# Patient Record
Sex: Male | Born: 1960 | Race: White | Hispanic: No | Marital: Married | Smoking: Never smoker
Health system: Southern US, Community
[De-identification: ages and names within clinical notes are randomized; demographics above are authoritative.]

## PROBLEM LIST (undated history)

## (undated) DIAGNOSIS — J45909 Unspecified asthma, uncomplicated: Secondary | ICD-10-CM

## (undated) DIAGNOSIS — M1990 Unspecified osteoarthritis, unspecified site: Secondary | ICD-10-CM

## (undated) DIAGNOSIS — B009 Herpesviral infection, unspecified: Secondary | ICD-10-CM

## (undated) DIAGNOSIS — F329 Major depressive disorder, single episode, unspecified: Secondary | ICD-10-CM

## (undated) DIAGNOSIS — F32A Depression, unspecified: Secondary | ICD-10-CM

## (undated) DIAGNOSIS — F419 Anxiety disorder, unspecified: Secondary | ICD-10-CM

## (undated) DIAGNOSIS — E785 Hyperlipidemia, unspecified: Secondary | ICD-10-CM

## (undated) DIAGNOSIS — K219 Gastro-esophageal reflux disease without esophagitis: Secondary | ICD-10-CM

## (undated) DIAGNOSIS — T7840XA Allergy, unspecified, initial encounter: Secondary | ICD-10-CM

## (undated) DIAGNOSIS — I10 Essential (primary) hypertension: Secondary | ICD-10-CM

## (undated) HISTORY — DX: Anxiety disorder, unspecified: F41.9

## (undated) HISTORY — DX: Allergy, unspecified, initial encounter: T78.40XA

## (undated) HISTORY — PX: JOINT REPLACEMENT: SHX530

## (undated) HISTORY — DX: Essential (primary) hypertension: I10

## (undated) HISTORY — DX: Gastro-esophageal reflux disease without esophagitis: K21.9

## (undated) HISTORY — DX: Depression, unspecified: F32.A

## (undated) HISTORY — PX: COLONOSCOPY WITH PROPOFOL: SHX5780

## (undated) HISTORY — DX: Unspecified asthma, uncomplicated: J45.909

## (undated) HISTORY — DX: Unspecified osteoarthritis, unspecified site: M19.90

## (undated) HISTORY — DX: Major depressive disorder, single episode, unspecified: F32.9

## (undated) HISTORY — PX: HERNIA REPAIR: SHX51

## (undated) HISTORY — DX: Hyperlipidemia, unspecified: E78.5

## (undated) HISTORY — DX: Herpesviral infection, unspecified: B00.9

---

## 2009-05-05 ENCOUNTER — Ambulatory Visit: Payer: Self-pay | Admitting: Gastroenterology

## 2010-03-24 ENCOUNTER — Ambulatory Visit: Payer: Self-pay | Admitting: Family Medicine

## 2010-04-21 ENCOUNTER — Ambulatory Visit: Payer: Self-pay | Admitting: Gastroenterology

## 2012-09-24 ENCOUNTER — Ambulatory Visit: Payer: Self-pay | Admitting: Family Medicine

## 2013-10-30 ENCOUNTER — Ambulatory Visit: Payer: Self-pay | Admitting: Physical Medicine and Rehabilitation

## 2014-06-23 DIAGNOSIS — I10 Essential (primary) hypertension: Secondary | ICD-10-CM | POA: Insufficient documentation

## 2014-06-23 DIAGNOSIS — E785 Hyperlipidemia, unspecified: Secondary | ICD-10-CM | POA: Insufficient documentation

## 2014-07-13 ENCOUNTER — Ambulatory Visit: Payer: Self-pay | Admitting: Family Medicine

## 2014-08-11 DIAGNOSIS — M62838 Other muscle spasm: Secondary | ICD-10-CM | POA: Insufficient documentation

## 2014-08-11 DIAGNOSIS — M5116 Intervertebral disc disorders with radiculopathy, lumbar region: Secondary | ICD-10-CM | POA: Insufficient documentation

## 2015-03-10 ENCOUNTER — Ambulatory Visit: Payer: Self-pay | Admitting: Gastroenterology

## 2015-03-22 ENCOUNTER — Ambulatory Visit
Admit: 2015-03-22 | Disposition: A | Discharge status: Home / Self Care | Payer: Self-pay | Attending: Physical Medicine and Rehabilitation | Admitting: Physical Medicine and Rehabilitation

## 2015-05-10 DIAGNOSIS — M5136 Other intervertebral disc degeneration, lumbar region: Secondary | ICD-10-CM | POA: Insufficient documentation

## 2015-06-04 ENCOUNTER — Ambulatory Visit (INDEPENDENT_AMBULATORY_CARE_PROVIDER_SITE_OTHER): Payer: Federal, State, Local not specified - PPO | Admitting: Family Medicine

## 2015-06-04 ENCOUNTER — Encounter: Payer: Self-pay | Admitting: Family Medicine

## 2015-06-04 VITALS — BP 118/72 | HR 75 | Temp 98.1°F | Resp 16 | Wt 178.8 lb

## 2015-06-04 DIAGNOSIS — R739 Hyperglycemia, unspecified: Secondary | ICD-10-CM | POA: Diagnosis not present

## 2015-06-04 DIAGNOSIS — E785 Hyperlipidemia, unspecified: Secondary | ICD-10-CM

## 2015-06-04 DIAGNOSIS — L259 Unspecified contact dermatitis, unspecified cause: Secondary | ICD-10-CM

## 2015-06-04 MED ORDER — FLUOCINONIDE 0.05 % EX CREA: 1.0000 "application " | TOPICAL_CREAM | Freq: Three times a day (TID) | CUTANEOUS | Status: DC

## 2015-06-04 NOTE — Patient Instructions (Addendum)
Use hydrocortisone for any eyelid rash. Continue oral antihistamines like Benadryl or Claritin for itching We will call you with the results of your labs

## 2015-06-04 NOTE — Progress Notes (Signed)
Subjective:     Patient ID: Terry Franco, male   DOB: 1961-08-09, 54 y.o.   MRN: [id number]  HPI  Chief Complaint  Patient presents with  . Poison Ivy    on middle finger on left hand, possibly around the eyes. started 8 days ago.  Has been taking oral Benadryl. As eyelids have started itching wishes to be checked prior to going on vacation tomorrow. Also wishes to update labs.   Review of Systems  Eyes: Positive for itching (eyelids only, eye itself not invovled without changes in vision).       Objective:   Physical Exam  Constitutional: He appears well-developed and well-nourished. No distress.  Eyes:  No upper eyelid rash noted  Skin: Rash (left third finger with fading rash/involuting vesicles) noted.       Assessment:     1. Contact dermatitis  - fluocinonide cream (LIDEX) 0.05 %; Apply 1 application topically 3 (three) times daily. To poison ivy rash  Dispense: 15 g; Refill: 0  2. Hyperglycemia  - Renal function panel  3. HLD (hyperlipidemia)  - Lipid panel    Plan:     Discussed use of otc hydrocortisone cream should he develop eyelid rash. Continue antihistamines. Not to use high potency cream on his face.

## 2015-07-07 ENCOUNTER — Encounter: Payer: Self-pay | Admitting: Family Medicine

## 2015-07-07 ENCOUNTER — Ambulatory Visit (INDEPENDENT_AMBULATORY_CARE_PROVIDER_SITE_OTHER): Payer: Federal, State, Local not specified - PPO | Admitting: Family Medicine

## 2015-07-07 VITALS — BP 110/62 | HR 73 | Temp 97.8°F | Resp 16 | Ht 69.0 in | Wt 180.0 lb

## 2015-07-07 DIAGNOSIS — J322 Chronic ethmoidal sinusitis: Secondary | ICD-10-CM | POA: Diagnosis not present

## 2015-07-07 MED ORDER — LEVOFLOXACIN 500 MG PO TABS: 500.0000 mg | ORAL_TABLET | Freq: Every day | ORAL | Status: DC

## 2015-07-07 NOTE — Patient Instructions (Signed)
Add Mucinex D. Continue steroid nasal spray.

## 2015-07-07 NOTE — Progress Notes (Signed)
Subjective:     Patient ID: Terry Franco, male   DOB: December 20, 1960, 54 y.o.   MRN: [id number]  HPI  Chief Complaint  Patient presents with  . Follow-up    Patient is here for follow up visit from urgent care on 06/28/15, patient has had complaints of sinus symptoms since 6/20. Patient was seen at Urgent care in [place] and wasx prescribed Levaquin and diagnosed with sinusitis, patient states that symptoms have improved but over the pst two days he has developed productive cough, sinus headache,sore throat and pain in both ears.   States he was resolving his sx on Levaquin but has persistent yellow-brown nasal drainage, sinus pressure and cough productive of the same. Continues on Nasacort for allergies.   Review of Systems  Constitutional: Negative for fever and chills.       Objective:   Physical Exam  Constitutional: He appears well-developed and well-nourished. No distress.  Ears: T.M's intact without inflammation; Right TM dull in appearance. Sinuses: right ethmoid sinus tenderness Throat: no tonsillar enlargement or exudate Neck: no cervical adenopathy Lungs: clear     Assessment:    1. Ethmoid sinusitis, unspecified chronicity - levofloxacin (LEVAQUIN) 500 MG tablet; Take 1 tablet (500 mg total) by mouth daily.  Dispense: 7 tablet; Refill: 0    Plan:    May add Mucinex D. Consider ENT consultation if not improved on added 7 day course of Levaquin.

## 2015-07-09 ENCOUNTER — Other Ambulatory Visit: Payer: Self-pay | Admitting: Family Medicine

## 2015-07-09 ENCOUNTER — Telehealth: Payer: Self-pay

## 2015-07-09 DIAGNOSIS — E785 Hyperlipidemia, unspecified: Secondary | ICD-10-CM

## 2015-07-09 LAB — LIPID PANEL
CHOLESTEROL TOTAL: 211 mg/dL — AB (ref 100–199)
Chol/HDL Ratio: 5 ratio units (ref 0.0–5.0)
HDL: 42 mg/dL (ref >39)
LDL CALC: 126 mg/dL — AB (ref 0–99)
TRIGLYCERIDES: 213 mg/dL — AB (ref 0–149)
VLDL Cholesterol Cal: 43 mg/dL — ABNORMAL HIGH (ref 5–40)

## 2015-07-09 LAB — RENAL FUNCTION PANEL
Albumin: 4.5 g/dL (ref 3.5–5.5)
BUN/Creatinine Ratio: 14 (ref 9–20)
BUN: 16 mg/dL (ref 6–24)
CO2: 24 mmol/L (ref 18–29)
CREATININE: 1.16 mg/dL (ref 0.76–1.27)
Calcium: 9.7 mg/dL (ref 8.7–10.2)
Chloride: 100 mmol/L (ref 97–108)
GFR calc Af Amer: 81 mL/min/{1.73_m2} (ref >59)
GFR calc non Af Amer: 70 mL/min/{1.73_m2} (ref >59)
Glucose: 104 mg/dL — ABNORMAL HIGH (ref 65–99)
PHOSPHORUS: 3.4 mg/dL (ref 2.5–4.5)
POTASSIUM: 3.9 mmol/L (ref 3.5–5.2)
Sodium: 142 mmol/L (ref 134–144)

## 2015-07-09 MED ORDER — PRAVASTATIN SODIUM 80 MG PO TABS: 80.0000 mg | ORAL_TABLET | Freq: Every day | ORAL | Status: DC

## 2015-07-09 NOTE — Telephone Encounter (Signed)
Patient has been advised he is in compliance with increasing his Pravastatin and wants Rx sent to [facility] [place].

## 2015-07-09 NOTE — Telephone Encounter (Signed)
-----   Message from [name], [place] sent at 07/09/2015  7:50 AM EDT ----- Sugar is mildly elevated as previously. Cholesterol remains mildly elevated despite current dose of pravastatin. Would recommend increased dose of pravastatin. Do you wish to do this? Also encourage regular exercise to maintain sugar control.

## 2015-10-19 ENCOUNTER — Encounter: Payer: Self-pay | Admitting: Family Medicine

## 2015-10-19 ENCOUNTER — Ambulatory Visit (INDEPENDENT_AMBULATORY_CARE_PROVIDER_SITE_OTHER): Payer: Federal, State, Local not specified - PPO | Admitting: Family Medicine

## 2015-10-19 VITALS — BP 116/76 | HR 106 | Temp 99.2°F | Resp 16 | Wt 180.8 lb

## 2015-10-19 DIAGNOSIS — B349 Viral infection, unspecified: Secondary | ICD-10-CM | POA: Diagnosis not present

## 2015-10-19 DIAGNOSIS — K219 Gastro-esophageal reflux disease without esophagitis: Secondary | ICD-10-CM

## 2015-10-19 DIAGNOSIS — R509 Fever, unspecified: Secondary | ICD-10-CM

## 2015-10-19 LAB — POCT INFLUENZA A/B
INFLUENZA B, POC: NEGATIVE
Influenza A, POC: NEGATIVE

## 2015-10-19 LAB — POCT RAPID STREP A (OFFICE): RAPID STREP A SCREEN: NEGATIVE

## 2015-10-19 MED ORDER — HYDROCODONE-HOMATROPINE 5-1.5 MG/5ML PO SYRP: ORAL_SOLUTION | ORAL | Status: DC

## 2015-10-19 NOTE — Progress Notes (Signed)
Subjective:     Patient ID: Terry Franco, male   DOB: 02/19/1961, 54 y.o.   MRN: [id number]  HPI  Chief Complaint  Patient presents with  . Fever    Patient comes in office today with concerns of flu like/cold symptoms since Saturday 10/29. Patient reports fever high 100.8, cough, sore throat, right ear pain, chills, body ache, sinus pain and pressure behind the eyes, diarrhea and nausea. Patient denies being around anyone who is sick or with similar symptoms in his household. Patient has taken otc Tylenol for relief.   Has not had the flu vaccine this year. Accompanied by his wife today.   Review of Systems     Objective:   Physical Exam  Constitutional: He appears well-developed and well-nourished. He has a sickly appearance.  Ears: T.M's intact without inflammation Throat: no tonsillar enlargement with mild erythema and ?  Exudate versus food particle on right tonsil Neck: no cervical adenopathy Lungs: clear     Assessment:    1. Fever, unspecified fever cause - POCT Influenza A/B - POCT rapid strep A - HYDROcodone-homatropine (HYCODAN) 5-1.5 MG/5ML syrup; 5 ml 4-6 hours as needed for cough  Dispense: 240 mL; Refill: 0  2. Viral syndrome   Plan:    Discussed use of Mucinex D for congestion, Delsym for cough, and Benadryl for postnasal drainage. May call if fever not resolving in the next 48 hours.

## 2015-10-19 NOTE — Patient Instructions (Signed)
Discussed use of Mucinex D for congestion, Delsym for cough, and Benadryl for postnasal drainage 

## 2015-10-21 ENCOUNTER — Ambulatory Visit
Admission: RE | Admit: 2015-10-21 | Discharge: 2015-10-21 | Disposition: A | Discharge status: Home / Self Care | Payer: Federal, State, Local not specified - PPO | Source: Ambulatory Visit | Attending: Family Medicine | Admitting: Family Medicine

## 2015-10-21 ENCOUNTER — Other Ambulatory Visit: Payer: Self-pay | Admitting: Family Medicine

## 2015-10-21 ENCOUNTER — Encounter: Payer: Self-pay | Admitting: Family Medicine

## 2015-10-21 ENCOUNTER — Ambulatory Visit (INDEPENDENT_AMBULATORY_CARE_PROVIDER_SITE_OTHER): Payer: Federal, State, Local not specified - PPO | Admitting: Family Medicine

## 2015-10-21 VITALS — BP 102/80 | HR 126 | Temp 100.1°F | Resp 18 | Wt 179.4 lb

## 2015-10-21 DIAGNOSIS — J849 Interstitial pulmonary disease, unspecified: Secondary | ICD-10-CM | POA: Diagnosis not present

## 2015-10-21 DIAGNOSIS — R509 Fever, unspecified: Secondary | ICD-10-CM | POA: Diagnosis present

## 2015-10-21 DIAGNOSIS — R300 Dysuria: Secondary | ICD-10-CM | POA: Diagnosis not present

## 2015-10-21 DIAGNOSIS — B349 Viral infection, unspecified: Secondary | ICD-10-CM

## 2015-10-21 DIAGNOSIS — J189 Pneumonia, unspecified organism: Secondary | ICD-10-CM

## 2015-10-21 DIAGNOSIS — R05 Cough: Secondary | ICD-10-CM | POA: Diagnosis present

## 2015-10-21 DIAGNOSIS — J181 Lobar pneumonia, unspecified organism: Principal | ICD-10-CM

## 2015-10-21 LAB — POCT URINALYSIS DIPSTICK
Glucose, UA: NEGATIVE
Leukocytes, UA: NEGATIVE
Nitrite, UA: NEGATIVE
PH UA: 6
PROTEIN UA: 300
UROBILINOGEN UA: 1

## 2015-10-21 MED ORDER — LEVOFLOXACIN 500 MG PO TABS: 500.0000 mg | ORAL_TABLET | Freq: Every day | ORAL | Status: DC

## 2015-10-21 NOTE — Patient Instructions (Signed)
We will call you with the x-ray report and decide on a course of action.

## 2015-10-21 NOTE — Progress Notes (Signed)
Subjective:     Patient ID: Terry Franco, male   DOB: [birth year], 54 y.o.   MRN: [id number]  HPI  Chief Complaint  Patient presents with  . Fever    Patient returns back to office today after visit on 10/19/15, reporting that symptoms have got worse. Patient reports fever high of 101.8 yesterday, productive cough with mucous, loss of appetitie, constipation for 2 days, difficullty urinating and dysuria. Patient believes cough presciption is not helping and making cough more productive  Accompanied by his wife today. States he has no appetite but has been trying to drink water. Reports yellowish sputum.   Review of Systems  Genitourinary:       No hx of kidney stone       Objective:   Physical Exam  Constitutional: He appears well-developed and well-nourished. He appears ill.  Pulmonary/Chest: Breath sounds normal. He has no wheezes. He has no rales.       Assessment:    1. Viral syndrome: evaluate for pneumonia - DG Chest 2 View; Future - CBC with Differential/Platelet - Renal function panel  2. Dysuria - POCT urinalysis dipstick - Urine culture    Plan:    Further f/u pending x-ray and lab results.

## 2015-10-22 ENCOUNTER — Telehealth: Payer: Self-pay

## 2015-10-22 LAB — CBC WITH DIFFERENTIAL/PLATELET
BASOS ABS: 0 10*3/uL (ref 0.0–0.2)
Basos: 0 %
EOS (ABSOLUTE): 0 10*3/uL (ref 0.0–0.4)
Eos: 0 %
Hematocrit: 44.7 % (ref 37.5–51.0)
Hemoglobin: 16.1 g/dL (ref 12.6–17.7)
Immature Grans (Abs): 0 10*3/uL (ref 0.0–0.1)
Immature Granulocytes: 0 %
LYMPHS ABS: 0.9 10*3/uL (ref 0.7–3.1)
Lymphs: 5 %
MCH: 32.3 pg (ref 26.6–33.0)
MCHC: 36 g/dL — AB (ref 31.5–35.7)
MCV: 90 fL (ref 79–97)
MONOS ABS: 2.2 10*3/uL — AB (ref 0.1–0.9)
Monocytes: 11 %
Neutrophils Absolute: 16 10*3/uL — ABNORMAL HIGH (ref 1.4–7.0)
Neutrophils: 84 %
Platelets: 292 10*3/uL (ref 150–379)
RBC: 4.98 x10E6/uL (ref 4.14–5.80)
RDW: 13.4 % (ref 12.3–15.4)
WBC: 19.2 10*3/uL — AB (ref 3.4–10.8)

## 2015-10-22 LAB — RENAL FUNCTION PANEL
Albumin: 4.6 g/dL (ref 3.5–5.5)
BUN/Creatinine Ratio: 12 (ref 9–20)
BUN: 15 mg/dL (ref 6–24)
CO2: 25 mmol/L (ref 18–29)
Calcium: 9.7 mg/dL (ref 8.7–10.2)
Chloride: 95 mmol/L — ABNORMAL LOW (ref 97–106)
Creatinine, Ser: 1.21 mg/dL (ref 0.76–1.27)
GFR calc non Af Amer: 67 mL/min/{1.73_m2} (ref >59)
GFR, EST AFRICAN AMERICAN: 77 mL/min/{1.73_m2} (ref >59)
GLUCOSE: 136 mg/dL — AB (ref 65–99)
POTASSIUM: 4.2 mmol/L (ref 3.5–5.2)
Phosphorus: 2.4 mg/dL — ABNORMAL LOW (ref 2.5–4.5)
Sodium: 139 mmol/L (ref 136–144)

## 2015-10-22 LAB — URINE CULTURE: ORGANISM ID, BACTERIA: NO GROWTH

## 2015-10-22 NOTE — Telephone Encounter (Signed)
-----   Message from [name], [place] sent at 10/22/2015  7:42 AM EDT ----- Kidney function is ok. White count is elevated consistent with your diagnosis of pneumonia. Push fluids and eat as tolerated.

## 2015-10-22 NOTE — Telephone Encounter (Signed)
Patient has been advised. KW 

## 2015-10-25 ENCOUNTER — Other Ambulatory Visit: Payer: Self-pay | Admitting: Family Medicine

## 2015-10-25 ENCOUNTER — Telehealth: Payer: Self-pay

## 2015-10-25 ENCOUNTER — Telehealth: Payer: Self-pay | Admitting: Family Medicine

## 2015-10-25 DIAGNOSIS — J181 Lobar pneumonia, unspecified organism: Principal | ICD-10-CM

## 2015-10-25 DIAGNOSIS — J189 Pneumonia, unspecified organism: Secondary | ICD-10-CM

## 2015-10-25 MED ORDER — BENZONATATE 100 MG PO CAPS: ORAL_CAPSULE | ORAL | Status: DC

## 2015-10-25 NOTE — Telephone Encounter (Signed)
Advised patient with results. Patient reports that he is almost out of cough syrup. He is requesting more to be sent into pharmacy. He uses [facility] on [address]

## 2015-10-25 NOTE — Telephone Encounter (Signed)
States fever and aches have abated along decreased sputum production. Now having paroxysms of cough. Will send in [facility].

## 2015-10-25 NOTE — Telephone Encounter (Signed)
-----   Message from [name], [place] sent at 10/22/2015  4:41 PM EDT ----- No urinary infection

## 2015-11-13 ENCOUNTER — Encounter: Payer: Self-pay | Admitting: Emergency Medicine

## 2015-11-13 ENCOUNTER — Emergency Department: Payer: Federal, State, Local not specified - PPO

## 2015-11-13 ENCOUNTER — Emergency Department
Admission: EM | Admit: 2015-11-13 | Discharge: 2015-11-13 | Disposition: A | Discharge status: Home / Self Care | Payer: Federal, State, Local not specified - PPO | Attending: Emergency Medicine | Admitting: Emergency Medicine

## 2015-11-13 DIAGNOSIS — Z880 Allergy status to penicillin: Secondary | ICD-10-CM | POA: Insufficient documentation

## 2015-11-13 DIAGNOSIS — Z791 Long term (current) use of non-steroidal anti-inflammatories (NSAID): Secondary | ICD-10-CM | POA: Insufficient documentation

## 2015-11-13 DIAGNOSIS — Z79899 Other long term (current) drug therapy: Secondary | ICD-10-CM | POA: Diagnosis not present

## 2015-11-13 DIAGNOSIS — R053 Chronic cough: Secondary | ICD-10-CM

## 2015-11-13 DIAGNOSIS — Z792 Long term (current) use of antibiotics: Secondary | ICD-10-CM | POA: Insufficient documentation

## 2015-11-13 DIAGNOSIS — I10 Essential (primary) hypertension: Secondary | ICD-10-CM | POA: Diagnosis not present

## 2015-11-13 DIAGNOSIS — R05 Cough: Secondary | ICD-10-CM | POA: Diagnosis present

## 2015-11-13 LAB — COMPREHENSIVE METABOLIC PANEL
ALBUMIN: 4.5 g/dL (ref 3.5–5.0)
ALT: 28 U/L (ref 17–63)
ANION GAP: 7 (ref 5–15)
AST: 26 U/L (ref 15–41)
Alkaline Phosphatase: 66 U/L (ref 38–126)
BUN: 12 mg/dL (ref 6–20)
CHLORIDE: 103 mmol/L (ref 101–111)
CO2: 31 mmol/L (ref 22–32)
CREATININE: 1.1 mg/dL (ref 0.61–1.24)
Calcium: 9.6 mg/dL (ref 8.9–10.3)
GFR calc non Af Amer: >60 mL/min (ref >60)
GLUCOSE: 122 mg/dL — AB (ref 65–99)
Potassium: 3.6 mmol/L (ref 3.5–5.1)
SODIUM: 141 mmol/L (ref 135–145)
Total Bilirubin: 0.6 mg/dL (ref 0.3–1.2)
Total Protein: 7.5 g/dL (ref 6.5–8.1)

## 2015-11-13 LAB — CBC
HCT: 43.6 % (ref 40.0–52.0)
Hemoglobin: 15.4 g/dL (ref 13.0–18.0)
MCH: 31.6 pg (ref 26.0–34.0)
MCHC: 35.3 g/dL (ref 32.0–36.0)
MCV: 89.5 fL (ref 80.0–100.0)
PLATELETS: 291 10*3/uL (ref 150–440)
RBC: 4.88 MIL/uL (ref 4.40–5.90)
RDW: 13 % (ref 11.5–14.5)
WBC: 10.9 10*3/uL — AB (ref 3.8–10.6)

## 2015-11-13 MED ORDER — ALBUTEROL SULFATE HFA 108 (90 BASE) MCG/ACT IN AERS: INHALATION_SPRAY | RESPIRATORY_TRACT | Status: DC

## 2015-11-13 MED ORDER — HYDROCODONE-HOMATROPINE 5-1.5 MG/5ML PO SYRP: 5.0000 mL | ORAL_SOLUTION | Freq: Four times a day (QID) | ORAL | Status: DC | PRN

## 2015-11-13 NOTE — ED Notes (Signed)
History of pneumonia diagnosed 11/3

## 2015-11-13 NOTE — Progress Notes (Signed)
Patient in good condition despite persistent loose cough which he states he has had for a couple of weeks. Seen for peak flow measurement. Patient able achieve volumes of 450 and 550 cc without difficulty. Continue to have some coughing episodes however tolerated procedure. Reported findings to RN. Educated patient as to what testing was for.

## 2015-11-13 NOTE — ED Notes (Signed)
Cardiopulmonary called to check peak flow and will come to see pt.

## 2015-11-13 NOTE — Discharge Instructions (Signed)
We believe your persistent cough is a result of a viral syndrome for which your symptoms have still not quite resolved.  Sometimes it takes many weeks to completely go away, especially if you smoke or have chronic lung problems.  Please take any medications prescribed and follow up as recommended with your regular doctor.  If you develop any new or worsening symptoms, including but not limited to fever, persistent vomiting, worsening shortness of breath, or other symptoms that concern you, please return to the Emergency Department immediately.    Cough, Adult Coughing is a reflex that clears your throat and your airways. Coughing helps to heal and protect your lungs. It is normal to cough occasionally, but a cough that happens with other symptoms or lasts a long time may be a sign of a condition that needs treatment. A cough may last only 2-3 weeks (acute), or it may last longer than 8 weeks (chronic). CAUSES Coughing is commonly caused by:  Breathing in substances that irritate your lungs.  A viral or bacterial respiratory infection.  Allergies.  Asthma.  Postnasal drip.  Smoking.  Acid backing up from the stomach into the esophagus (gastroesophageal reflux).  Certain medicines.  Chronic lung problems, including COPD (or rarely, lung cancer).  Other medical conditions such as heart failure. HOME CARE INSTRUCTIONS  Pay attention to any changes in your symptoms. Take these actions to help with your discomfort:  Take medicines only as told by your health care provider.  If you were prescribed an antibiotic medicine, take it as told by your health care provider. Do not stop taking the antibiotic even if you start to feel better.  Talk with your health care provider before you take a cough suppressant medicine.  Drink enough fluid to keep your urine clear or pale yellow.  If the air is dry, use a cold steam vaporizer or humidifier in your bedroom or your home to help loosen  secretions.  Avoid anything that causes you to cough at work or at home.  If your cough is worse at night, try sleeping in a semi-upright position.  Avoid cigarette smoke. If you smoke, quit smoking. If you need help quitting, ask your health care provider.  Avoid caffeine.  Avoid alcohol.  Rest as needed. SEEK MEDICAL CARE IF:   You have new symptoms.  You cough up pus.  Your cough does not get better after 2-3 weeks, or your cough gets worse.  You cannot control your cough with suppressant medicines and you are losing sleep.  You develop pain that is getting worse or pain that is not controlled with pain medicines.  You have a fever.  You have unexplained weight loss.  You have night sweats. SEEK IMMEDIATE MEDICAL CARE IF:  You cough up blood.  You have difficulty breathing.  Your heartbeat is very fast.   This information is not intended to replace advice given to you by your health care provider. Make sure you discuss any questions you have with your health care provider.   Document Released: 06/02/2011 Document Revised: 08/25/2015 Document Reviewed: 02/10/2015 Elsevier Interactive Patient Education [employer].

## 2015-11-13 NOTE — ED Provider Notes (Addendum)
[facility] Emergency Department Provider Note  ____________________________________________  Time seen: Approximately 10:32 PM  I have reviewed the triage vital signs and the nursing notes.   HISTORY  Chief Complaint Cough    HPI Terry Franco is a 54 y.o. male with a past medical history that includes hypertension, acid reflux, and a recent diagnosis of pneumonia.  Of note, although the patient's medical history includes HIV as a prior diagnosis, the patient denied having HIV when I specifically ask him.  He presents today with persistent cough after about one month.  This is after taking a 10 day course of Levaquin to cure pneumonia that was seen on x-ray by his primary care doctor.  He states that the cough is not necessarily worse than it was before but it is persistent and "wearing me out".  It is nonproductive and he denies fever/chills, chest pain, shortness of breath, abdominal pain, nausea/vomiting, dysuria.   Past Medical History  Diagnosis Date  . Hypertension   . HIV infection ([place])   . GERD (gastroesophageal reflux disease)   . Allergy     Patient Active Problem List   Diagnosis Date Noted  . GERD (gastroesophageal reflux disease) 10/19/2015  . DDD (degenerative disc disease), lumbar 05/10/2015  . Neuritis or radiculitis due to rupture of lumbar intervertebral disc 08/11/2014  . Muscle spasms of neck 08/11/2014  . BP (high blood pressure) 06/23/2014  . HLD (hyperlipidemia) 06/23/2014    Past Surgical History  Procedure Laterality Date  . Hernia repair      Current Outpatient Rx  Name  Route  Sig  Dispense  Refill  . albuterol (PROVENTIL HFA;VENTOLIN HFA) 108 (90 BASE) MCG/ACT inhaler      Inhale 4-6 puffs by mouth every 4 hours as needed for wheezing, cough, and/or shortness of breath   1 Inhaler   1   . benzonatate (TESSALON) 100 MG capsule      Take one or two every 8 hours as needed for cough   30 capsule   0   .  dexlansoprazole (DEXILANT) 60 MG capsule   Oral   Take by mouth.         . fexofenadine (ALLEGRA) 180 MG tablet   Oral   Take by mouth.         . Glucosamine-Chondroitin-MSM [id number] MG TABS   Oral   Take by mouth.         Marland Kitchen HYDROcodone-homatropine (HYCODAN) 5-1.5 MG/5ML syrup   Oral   Take 5 mLs by mouth every 6 (six) hours as needed for cough.   120 mL   0   . levofloxacin (LEVAQUIN) 500 MG tablet   Oral   Take 1 tablet (500 mg total) by mouth daily.   10 tablet   0   . losartan-hydrochlorothiazide (HYZAAR) 100-25 MG per tablet   Oral   Take by mouth.         . meloxicam (MOBIC) 15 MG tablet   Oral   Take by mouth.         . metaxalone (SKELAXIN) 800 MG tablet   Oral   Take by mouth.         . pravastatin (PRAVACHOL) 80 MG tablet   Oral   Take 1 tablet (80 mg total) by mouth daily.   90 tablet   3     Allergies Ibuprofen and Penicillin v potassium  Family History  Problem Relation Age of Onset  . Cancer Mother  melanoma  . Cancer Father     lung cancer  . Heart disease Father     Social History Social History  Substance Use Topics  . Smoking status: Never Smoker   . Smokeless tobacco: None  . Alcohol Use: 0.0 oz/week    0 Standard drinks or equivalent per week    Review of Systems Constitutional: No fever/chills Eyes: No visual changes. ENT: No sore throat. Cardiovascular: Denies chest pain. Respiratory: Denies shortness of breath.  Persistent cough for 1 month Gastrointestinal: No abdominal pain.  No nausea, no vomiting.  No diarrhea.  No constipation. Genitourinary: Negative for dysuria.  Occasional urinary hesitancy. Musculoskeletal: Negative for back pain. Skin: Negative for rash. Neurological: Negative for headaches, focal weakness or numbness.  10-point ROS otherwise negative.  ____________________________________________   PHYSICAL EXAM:  VITAL SIGNS: ED Triage Vitals  Enc Vitals Group     BP 11/13/15  1807 174/106 mmHg     Pulse Rate 11/13/15 1807 89     Resp 11/13/15 1807 20     Temp 11/13/15 1807 98.2 F (36.8 C)     Temp Source 11/13/15 1807 Oral     SpO2 11/13/15 1807 97 %     Weight 11/13/15 1807 168 lb (76.204 kg)     Height 11/13/15 1807 5\' 9"  (1.753 m)     Head Cir --      Peak Flow --      Pain Score --      Pain Loc --      Pain Edu? --      Excl. in [place]? --     Constitutional: Alert and oriented. Well appearing and in no acute distress. Eyes: Conjunctivae are normal. PERRL. EOMI. Head: Atraumatic. Nose: No congestion/rhinnorhea. Mouth/Throat: Mucous membranes are moist.  Oropharynx non-erythematous. Neck: No stridor.   Cardiovascular: Normal rate, regular rhythm. Grossly normal heart sounds.  Good peripheral circulation. Respiratory: Normal respiratory effort.  No retractions. Lungs CTAB.  Frequent dry cough. Gastrointestinal: Soft and nontender. No distention. No abdominal bruits. No CVA tenderness. Musculoskeletal: No lower extremity tenderness nor edema.  No joint effusions. Neurologic:  Normal speech and language. No gross focal neurologic deficits are appreciated.  Skin:  Skin is warm, dry and intact. No rash noted. Psychiatric: Mood and affect are normal. Speech and behavior are normal.  ____________________________________________   LABS (all labs ordered are listed, but only abnormal results are displayed)  Labs Reviewed  CBC - Abnormal; Notable for the following:    WBC 10.9 (*)    All other components within normal limits  COMPREHENSIVE METABOLIC PANEL - Abnormal; Notable for the following:    Glucose, Bld 122 (*)    All other components within normal limits   ____________________________________________  EKG  Not indicated ____________________________________________  RADIOLOGY   Dg Chest 2 View  11/13/2015  CLINICAL DATA:  History of pneumonia diagnosed on 10/21/2015. Patient is taken 10 days of antibiotics and now cough is worsening.  History of hypertension and HIV. EXAM: CHEST  2 VIEW COMPARISON:  10/21/2015 FINDINGS: Normal heart size and pulmonary vascularity. No focal airspace disease or consolidation in the lungs. No blunting of costophrenic angles. No pneumothorax. Mediastinal contours appear intact. Infiltration seen previously in the right lung has resolved. Mild degenerative changes in the spine. IMPRESSION: No active cardiopulmonary disease. Electronically Signed   By: Lucienne Capers M.D.   On: 11/13/2015 18:58    ____________________________________________   PROCEDURES  Procedure(s) performed: None  Critical Care performed: No ____________________________________________  INITIAL IMPRESSION / ASSESSMENT AND PLAN / ED COURSE  Pertinent labs & imaging results that were available during my care of the patient were reviewed by me and considered in my medical decision making (see chart for details).  The patient's workup is unremarkable and his vital signs are normal.  I discussed with the patient and his wife the possibility that his acid reflux may be causing marked tearing to his persistent cough.  He takes an ARB and not an ACE inhibitor, so this is less likely to be medication related.  I will prescribe him some cough medicine and albuterol inhaler which may help with the frequent bronchospasms.  He has an appointment to follow-up with his primary care doctor in 3 days.  I gave my usual and customary return precautions.      ____________________________________________  FINAL CLINICAL IMPRESSION(S) / ED DIAGNOSES  Final diagnoses:  Chronic cough      NEW MEDICATIONS STARTED DURING THIS VISIT:  New Prescriptions   ALBUTEROL (PROVENTIL HFA;VENTOLIN HFA) 108 (90 BASE) MCG/ACT INHALER    Inhale 4-6 puffs by mouth every 4 hours as needed for wheezing, cough, and/or shortness of breath   HYDROCODONE-HOMATROPINE (HYCODAN) 5-1.5 MG/5ML SYRUP    Take 5 mLs by mouth every 6 (six) hours as needed for  cough.     Hinda Kehr, MD 11/13/15 2253

## 2015-11-16 ENCOUNTER — Encounter: Payer: Self-pay | Admitting: Family Medicine

## 2015-11-16 ENCOUNTER — Ambulatory Visit (INDEPENDENT_AMBULATORY_CARE_PROVIDER_SITE_OTHER): Payer: Federal, State, Local not specified - PPO | Admitting: Family Medicine

## 2015-11-16 VITALS — BP 122/70 | HR 77 | Temp 98.7°F | Resp 16 | Wt 172.6 lb

## 2015-11-16 DIAGNOSIS — R05 Cough: Secondary | ICD-10-CM | POA: Diagnosis not present

## 2015-11-16 DIAGNOSIS — K219 Gastro-esophageal reflux disease without esophagitis: Secondary | ICD-10-CM | POA: Diagnosis not present

## 2015-11-16 DIAGNOSIS — J309 Allergic rhinitis, unspecified: Secondary | ICD-10-CM | POA: Insufficient documentation

## 2015-11-16 DIAGNOSIS — J301 Allergic rhinitis due to pollen: Secondary | ICD-10-CM

## 2015-11-16 DIAGNOSIS — R059 Cough, unspecified: Secondary | ICD-10-CM

## 2015-11-16 MED ORDER — PREDNISONE 10 MG PO TABS: ORAL_TABLET | ORAL | Status: DC

## 2015-11-16 MED ORDER — BENZONATATE 100 MG PO CAPS: ORAL_CAPSULE | ORAL | Status: DC

## 2015-11-16 NOTE — Patient Instructions (Addendum)
Change Nasacort to two squirts each nostril daily. Try Prevacid 30 mg. otc for reflux once Dexilant gone. If sinuses and accompanying cough do not improve over the week start the prednisone.

## 2015-11-16 NOTE — Progress Notes (Addendum)
Subjective:     Patient ID: Terry Franco, male   DOB: 09-Jul-1961, 54 y.o.   MRN: [id number]  HPI  Chief Complaint  Patient presents with  . Follow-up    Patient is present in offie today for follow up, patient was diagnosed with pneumonia of rith right upper lobe on 10/21/15. Patient was prescribed Levaquin 500mg  and on 11/7 was prescribed Tessalon 100mg . Patient reports he was seen at [facility] on 11/26 with complaints aof productive cough, shortness of breath and wheezing. Labs and x-ray were WNL, patient was presribed Albueterol and Hydrocone-Homatropine. Today patient states that his cough is still productive of yellow/brown phlegm.  States he noticed profuse clear watery sinus drainage with PND and accompanying cough on Thanksgiving day. This  exacerbated pre-existing GERD and residual cough from pneumonia. Reports cough and drainage seem to be improving with use of Nasacort (otc), cough syrup. Tessalon, and Albuterol. Cough remains minimally productive. Reports they are moving from their home and he has been cleaning and painting.   Review of Systems  Constitutional: Negative for fever and chills.       Objective:   Physical Exam  Constitutional: He appears well-developed and well-nourished. No distress.  Pulmonary/Chest: Breath sounds normal. Wheezes: basilar wheezes with forced  expiration.       Assessment:    1. Allergic rhinitis due to pollen - predniSONE (DELTASONE) 10 MG tablet; Taper daily as follows: 6 pills, 5, 4, 3, 2, 1  Dispense: 21 tablet; Refill: 0  2. Gastroesophageal reflux disease, esophagitis presence not specified  3. Cough - benzonatate (TESSALON) 100 MG capsule; Take one or two every 8 hours as needed for cough  Dispense: 30 capsule; Refill: 0 - predniSONE (DELTASONE) 10 MG tablet; Taper daily as follows: 6 pills, 5, 4, 3, 2, 1  Dispense: 21 tablet; Refill: 0    Plan:    Will add prednisone if sx do not continue to improve with current regimen. Continue  present medication for now. Consider referral back to an allergist.

## 2016-02-18 ENCOUNTER — Ambulatory Visit (INDEPENDENT_AMBULATORY_CARE_PROVIDER_SITE_OTHER): Payer: Federal, State, Local not specified - PPO | Admitting: Family Medicine

## 2016-02-18 ENCOUNTER — Encounter: Payer: Self-pay | Admitting: Family Medicine

## 2016-02-18 VITALS — BP 122/72 | HR 50 | Temp 97.6°F | Resp 16 | Wt 173.2 lb

## 2016-02-18 DIAGNOSIS — K1370 Unspecified lesions of oral mucosa: Secondary | ICD-10-CM | POA: Diagnosis not present

## 2016-02-18 DIAGNOSIS — K120 Recurrent oral aphthae: Secondary | ICD-10-CM

## 2016-02-18 NOTE — Progress Notes (Signed)
Subjective:     Patient ID: Terry Franco, male   DOB: 1961/03/05, 55 y.o.   MRN: [id number]  HPI  Chief Complaint  Patient presents with  . Mouth Lesions    Patient comes in office today with conerns of mouth ulcers that has been present in his mouth for the past two weeks. Patient reports that ulcerr was initally on left side of cheek and now has moved to right side. Patient describes ulcer as very painful, he has been under more stress lateley and didnt know if that was cause of outbreak  States he recurrently will develop mouth ulcers and will bite the inside of his mouth. Currently has a small ulcer on his tongue and a lesion on his left lower lip which he states is easy to bite. Has been using Oragel for his symptoms. He is in the process of selling his house and has had to make unexpected repairs.   Review of Systems     Objective:   Physical Exam  Constitutional: He appears well-developed and well-nourished. He appears distressed (tired of being in pain).  HENT:  Small aphthous ulcer on the middle of his tongue. Left lower inner lip with 1 cm.lesion ? Retention cyst.       Assessment:    1. Aphthous ulcer: rx of MVLB mixture 180 ml. With refill.  2. Oral mucosal lesion - Ambulatory referral to Oral Maxillofacial Surgery    Plan:   Further f/u pending referral.

## 2016-02-18 NOTE — Patient Instructions (Signed)
Try MVLB mixture.

## 2016-03-06 ENCOUNTER — Ambulatory Visit (INDEPENDENT_AMBULATORY_CARE_PROVIDER_SITE_OTHER): Payer: Federal, State, Local not specified - PPO | Admitting: Family Medicine

## 2016-03-06 ENCOUNTER — Encounter: Payer: Self-pay | Admitting: Family Medicine

## 2016-03-06 VITALS — BP 122/80 | HR 60 | Temp 97.7°F | Resp 16 | Wt 177.0 lb

## 2016-03-06 DIAGNOSIS — B9789 Other viral agents as the cause of diseases classified elsewhere: Secondary | ICD-10-CM

## 2016-03-06 DIAGNOSIS — J029 Acute pharyngitis, unspecified: Secondary | ICD-10-CM | POA: Diagnosis not present

## 2016-03-06 DIAGNOSIS — K120 Recurrent oral aphthae: Secondary | ICD-10-CM | POA: Diagnosis not present

## 2016-03-06 DIAGNOSIS — J028 Acute pharyngitis due to other specified organisms: Principal | ICD-10-CM

## 2016-03-06 LAB — POCT RAPID STREP A (OFFICE): RAPID STREP A SCREEN: NEGATIVE

## 2016-03-06 MED ORDER — VALACYCLOVIR HCL 1 G PO TABS: 1000.0000 mg | ORAL_TABLET | Freq: Two times a day (BID) | ORAL | Status: DC

## 2016-03-06 MED ORDER — PREDNISONE 10 MG PO TABS: ORAL_TABLET | ORAL | Status: DC

## 2016-03-06 NOTE — Patient Instructions (Signed)
For next episode of ulcers try Valtrex.

## 2016-03-06 NOTE — Progress Notes (Signed)
Subjective:     Patient ID: Terry Franco, male   DOB: 06/17/1961, 55 y.o.   MRN: [id number]  HPI  Chief Complaint  Patient presents with  . Sore Throat    Patient comes in office today with complaints of sore throat for a week. Patient reports associated with sore throat he has had; post nasal fdrip, headaches, sores on his mouth and tongue and diarrhea. Patient reports he is using otc Orajel and prescription mouth wash  Here for further treatment of recurrent aphthous ulcers. This has been an issue for much of his adult life. States he cancelled the oral surgeon referral as the mouth lesion spontaneously improved. Still under stress completing sale of his home. Acknowledges he is chronically anxious to some degree but does not wish to be on any medication for this.   Review of Systems     Objective:   Physical Exam  Constitutional: He appears well-developed and well-nourished. He appears distressed (frustrated/mild pain).  HENT:  Aphthous appearing ulcers of his tongue, inner lip and a large one on his uvula.       Assessment:    1. Sore throat  - POCT rapid strep A  2. Aphthous ulce - predniSONE (DELTASONE) 10 MG tablet; Taper daily as follows: 6 pills, 5, 4, 3, 2, 1  Dispense: 21 tablet; Refill: 0 - valACYclovir (VALTREX) 1000 MG tablet; Take 1 tablet (1,000 mg total) by mouth 2 (two) times daily.  Dispense: 14 tablet; Refill: 2    Plan:    Try prednisone now. Try Valtrex for a recurrence. Wishes to consider referral to Dr. Ola Spurr, I.D, if not improving.

## 2016-03-21 ENCOUNTER — Emergency Department: Payer: Federal, State, Local not specified - PPO

## 2016-03-21 ENCOUNTER — Encounter: Payer: Self-pay | Admitting: Emergency Medicine

## 2016-03-21 ENCOUNTER — Emergency Department
Admission: EM | Admit: 2016-03-21 | Discharge: 2016-03-21 | Disposition: A | Discharge status: Home / Self Care | Payer: Federal, State, Local not specified - PPO | Attending: Emergency Medicine | Admitting: Emergency Medicine

## 2016-03-21 DIAGNOSIS — R109 Unspecified abdominal pain: Secondary | ICD-10-CM | POA: Diagnosis not present

## 2016-03-21 DIAGNOSIS — I10 Essential (primary) hypertension: Secondary | ICD-10-CM | POA: Diagnosis not present

## 2016-03-21 DIAGNOSIS — N132 Hydronephrosis with renal and ureteral calculous obstruction: Secondary | ICD-10-CM | POA: Diagnosis not present

## 2016-03-21 DIAGNOSIS — M5136 Other intervertebral disc degeneration, lumbar region: Secondary | ICD-10-CM | POA: Diagnosis not present

## 2016-03-21 DIAGNOSIS — K219 Gastro-esophageal reflux disease without esophagitis: Secondary | ICD-10-CM | POA: Diagnosis not present

## 2016-03-21 DIAGNOSIS — Z79899 Other long term (current) drug therapy: Secondary | ICD-10-CM | POA: Insufficient documentation

## 2016-03-21 DIAGNOSIS — N201 Calculus of ureter: Secondary | ICD-10-CM | POA: Diagnosis not present

## 2016-03-21 DIAGNOSIS — N200 Calculus of kidney: Secondary | ICD-10-CM | POA: Diagnosis not present

## 2016-03-21 DIAGNOSIS — E785 Hyperlipidemia, unspecified: Secondary | ICD-10-CM | POA: Insufficient documentation

## 2016-03-21 LAB — COMPREHENSIVE METABOLIC PANEL
ALT: 41 U/L (ref 17–63)
AST: 29 U/L (ref 15–41)
Albumin: 4.5 g/dL (ref 3.5–5.0)
Alkaline Phosphatase: 50 U/L (ref 38–126)
Anion gap: 7 (ref 5–15)
BUN: 20 mg/dL (ref 6–20)
CO2: 27 mmol/L (ref 22–32)
Calcium: 9.5 mg/dL (ref 8.9–10.3)
Chloride: 104 mmol/L (ref 101–111)
Creatinine, Ser: 1.07 mg/dL (ref 0.61–1.24)
GFR calc Af Amer: >60 mL/min (ref >60)
GFR calc non Af Amer: >60 mL/min (ref >60)
Glucose, Bld: 108 mg/dL — ABNORMAL HIGH (ref 65–99)
Potassium: 3.4 mmol/L — ABNORMAL LOW (ref 3.5–5.1)
Sodium: 138 mmol/L (ref 135–145)
Total Bilirubin: 0.9 mg/dL (ref 0.3–1.2)
Total Protein: 7.2 g/dL (ref 6.5–8.1)

## 2016-03-21 LAB — URINALYSIS COMPLETE WITH MICROSCOPIC (ARMC ONLY)
Bacteria, UA: NONE SEEN
Bilirubin Urine: NEGATIVE
Glucose, UA: NEGATIVE mg/dL
Ketones, ur: NEGATIVE mg/dL
Leukocytes, UA: NEGATIVE
Nitrite: NEGATIVE
Protein, ur: NEGATIVE mg/dL
Specific Gravity, Urine: 1.009 (ref 1.005–1.030)
Squamous Epithelial / HPF: NONE SEEN
WBC, UA: NONE SEEN WBC/hpf (ref 0–5)
pH: 7 (ref 5.0–8.0)

## 2016-03-21 LAB — CBC
HEMATOCRIT: 44.2 % (ref 40.0–52.0)
HEMOGLOBIN: 15.7 g/dL (ref 13.0–18.0)
MCH: 31.3 pg (ref 26.0–34.0)
MCHC: 35.4 g/dL (ref 32.0–36.0)
MCV: 88.2 fL (ref 80.0–100.0)
Platelets: 282 10*3/uL (ref 150–440)
RBC: 5.01 MIL/uL (ref 4.40–5.90)
RDW: 13.3 % (ref 11.5–14.5)
WBC: 9.8 10*3/uL (ref 3.8–10.6)

## 2016-03-21 LAB — LIPASE, BLOOD: Lipase: 27 U/L (ref 11–51)

## 2016-03-21 MED ORDER — HYDROMORPHONE HCL 1 MG/ML IJ SOLN
INTRAMUSCULAR | Status: AC
  Filled 2016-03-21: qty 1

## 2016-03-21 MED ORDER — ONDANSETRON HCL 4 MG/2ML IJ SOLN
INTRAMUSCULAR | Status: AC
  Filled 2016-03-21: qty 2

## 2016-03-21 MED ORDER — TAMSULOSIN HCL 0.4 MG PO CAPS: 0.4000 mg | ORAL_CAPSULE | Freq: Every day | ORAL | Status: DC

## 2016-03-21 MED ORDER — ONDANSETRON HCL 4 MG PO TABS: 4.0000 mg | ORAL_TABLET | Freq: Every day | ORAL | Status: DC | PRN

## 2016-03-21 MED ORDER — HYDROMORPHONE HCL 1 MG/ML IJ SOLN
1.0000 mg | Freq: Once | INTRAMUSCULAR | Status: AC
  Administered 2016-03-21: 1 mg via INTRAVENOUS

## 2016-03-21 MED ORDER — SODIUM CHLORIDE 0.9 % IV SOLN
1000.0000 mL | Freq: Once | INTRAVENOUS | Status: AC
  Administered 2016-03-21: 1000 mL via INTRAVENOUS

## 2016-03-21 MED ORDER — OXYCODONE HCL 5 MG PO TABS: 5.0000 mg | ORAL_TABLET | Freq: Three times a day (TID) | ORAL | Status: DC | PRN

## 2016-03-21 MED ORDER — ONDANSETRON HCL 4 MG/2ML IJ SOLN
4.0000 mg | Freq: Once | INTRAMUSCULAR | Status: AC
  Administered 2016-03-21: 4 mg via INTRAVENOUS

## 2016-03-21 NOTE — ED Provider Notes (Signed)
[facility] Emergency Department Provider Note  ____________________________________________    I have reviewed the triage vital signs and the nursing notes.   HISTORY  Chief Complaint Flank Pain    HPI Terry Franco is a 55 y.o. male who presents with complaints of left flank pain.Patient reports the pain is severe and stabbing in nature. Started approximately 7:30 this morning when he woke up. Yesterday he felt well. He has never had this pain before. He denies hematuria. No fevers or chills or dysuria. He reports mild nausea.     Past Medical History  Diagnosis Date  . Hypertension   . GERD (gastroesophageal reflux disease)   . Allergy     Patient Active Problem List   Diagnosis Date Noted  . Allergic rhinitis 11/16/2015  . GERD (gastroesophageal reflux disease) 10/19/2015  . DDD (degenerative disc disease), lumbar 05/10/2015  . Neuritis or radiculitis due to rupture of lumbar intervertebral disc 08/11/2014  . Muscle spasms of neck 08/11/2014  . BP (high blood pressure) 06/23/2014  . HLD (hyperlipidemia) 06/23/2014    Past Surgical History  Procedure Laterality Date  . Hernia repair      Current Outpatient Rx  Name  Route  Sig  Dispense  Refill  . esomeprazole (NEXIUM) 40 MG capsule   Oral   Take 40 mg by mouth daily.          . Glucosamine-Chondroitin-MSM 750-400-375 MG TABS   Oral   Take 1 tablet by mouth daily.          Marland Kitchen losartan-hydrochlorothiazide (HYZAAR) 100-25 MG per tablet   Oral   Take 1 tablet by mouth daily.          . meloxicam (MOBIC) 15 MG tablet   Oral   Take 15 mg by mouth 2 (two) times daily.          . pravastatin (PRAVACHOL) 40 MG tablet   Oral   Take 40 mg by mouth daily.         Marland Kitchen tiZANidine (ZANAFLEX) 4 MG tablet   Oral   Take 4 mg by mouth 3 (three) times daily.       0   . ondansetron (ZOFRAN) 4 MG tablet   Oral   Take 1 tablet (4 mg total) by mouth daily as needed for nausea or  vomiting.   20 tablet   1   . oxyCODONE (ROXICODONE) 5 MG immediate release tablet   Oral   Take 1 tablet (5 mg total) by mouth every 8 (eight) hours as needed.   20 tablet   0   . tamsulosin (FLOMAX) 0.4 MG CAPS capsule   Oral   Take 1 capsule (0.4 mg total) by mouth daily.   7 capsule   0     Allergies Ibuprofen; Penicillin v potassium; and Versed  Family History  Problem Relation Age of Onset  . Cancer Mother     melanoma  . Cancer Father     lung cancer  . Heart disease Father     Social History Social History  Substance Use Topics  . Smoking status: Never Smoker   . Smokeless tobacco: None  . Alcohol Use: 0.0 oz/week    0 Standard drinks or equivalent per week    Review of Systems  Constitutional: Negative for fever. Eyes: Negative for Discharge  Cardiovascular: Negative for chest pain Respiratory: Negative for shortness of breath. Gastrointestinal: Flank pain as above Genitourinary: Negative for dysuria. Musculoskeletal: Left back pain  as above Skin: Negative for rash. Neurological: Negative for focal weakness Psychiatric: no anxiety    ____________________________________________   PHYSICAL EXAM:  VITAL SIGNS: ED Triage Vitals  Enc Vitals Group     BP 03/21/16 0824 154/96 mmHg     Pulse Rate 03/21/16 0824 65     Resp 03/21/16 0824 18     Temp 03/21/16 0824 97.5 F (36.4 C)     Temp Source 03/21/16 0824 Oral     SpO2 03/21/16 0824 100 %     Weight 03/21/16 0824 170 lb (77.111 kg)     Height 03/21/16 0824 5\' 9"  (1.753 m)     Head Cir --      Peak Flow --      Pain Score 03/21/16 0824 8     Pain Loc --      Pain Edu? --      Excl. in [place]? --      Constitutional: Alert and oriented. Obviously uncomfortable Eyes: Conjunctivae are normal. No erythema or injection ENT   Head: Normocephalic and atraumatic.   Mouth/Throat: Mucous membranes are moist. Cardiovascular: Normal rate, regular rhythm. Normal and symmetric distal pulses  are present in the upper extremities.   Respiratory: Normal respiratory effort without tachypnea nor retractions. Breath sounds are clear and equal bilaterally.  Gastrointestinal: Soft and non-tender in all quadrants. No distention. There is no CVA tenderness. Genitourinary: deferred Musculoskeletal: Nontender with normal range of motion in all extremities. No lower extremity tenderness nor edema. Neurologic:  Normal speech and language. No gross focal neurologic deficits are appreciated. Skin:  Skin is warm, dry and intact. No rash noted. Psychiatric: Mood and affect are normal. Patient exhibits appropriate insight and judgment.  ____________________________________________    LABS (pertinent positives/negatives)  Labs Reviewed  COMPREHENSIVE METABOLIC PANEL - Abnormal; Notable for the following:    Potassium 3.4 (*)    Glucose, Bld 108 (*)    All other components within normal limits  URINALYSIS COMPLETEWITH MICROSCOPIC ([facility] ONLY) - Abnormal; Notable for the following:    Color, Urine YELLOW (*)    APPearance CLEAR (*)    Hgb urine dipstick 3+ (*)    All other components within normal limits  CBC  LIPASE, BLOOD    ____________________________________________   EKG  None ____________________________________________    RADIOLOGY  CT scan shows left UVJ stone  ____________________________________________   PROCEDURES  Procedure(s) performed: none  Critical Care performed: none  ____________________________________________   INITIAL IMPRESSION / ASSESSMENT AND PLAN / ED COURSE  Pertinent labs & imaging results that were available during my care of the patient were reviewed by me and considered in my medical decision making (see chart for details).  Patient presents with left flank pain which began this morning. His exam and presentation is suspicious for ureterolithiasis. IV Dilaudid IV Zofran and IV fluids ordered. CT renal stone study pending  Patient had  near complete relief from Dilaudid. CT scan shows 3 mm left stone. Urinalysis is unremarkable. Lower is reassuring. We will discharge the patient with analgesics and urology follow-up as needed. Return precautions discussed at length. Patient and his wife are content with this plan    ____________________________________________   FINAL CLINICAL IMPRESSION(S) / ED DIAGNOSES  Final diagnoses:  Flank pain  Kidney stone          Lavonia Drafts, MD 03/21/16 1512

## 2016-03-21 NOTE — ED Notes (Signed)
  Pt transported to ct 

## 2016-03-21 NOTE — ED Notes (Signed)
Pt to ed with c/o acute onset of left flank pain this am that radiates to left groin area.  Pt states difficulty urinating this am.

## 2016-03-21 NOTE — ED Notes (Signed)
Pt discharged home after verbalizing understanding of discharge instructions; nad noted. 

## 2016-03-21 NOTE — Discharge Instructions (Signed)
Kidney Stones °Kidney stones (urolithiasis) are deposits that form inside your kidneys. The intense pain is caused by the stone moving through the urinary tract. When the stone moves, the ureter goes into spasm around the stone. The stone is usually passed in the urine.  °CAUSES  °· A disorder that makes certain neck glands produce too much parathyroid hormone (primary hyperparathyroidism). °· A buildup of uric acid crystals, similar to gout in your joints. °· Narrowing (stricture) of the ureter. °· A kidney obstruction present at birth (congenital obstruction). °· Previous surgery on the kidney or ureters. °· Numerous kidney infections. °SYMPTOMS  °· Feeling sick to your stomach (nauseous). °· Throwing up (vomiting). °· Blood in the urine (hematuria). °· Pain that usually spreads (radiates) to the groin. °· Frequency or urgency of urination. °DIAGNOSIS  °· Taking a history and physical exam. °· Blood or urine tests. °· CT scan. °· Occasionally, an examination of the inside of the urinary bladder (cystoscopy) is performed. °TREATMENT  °· Observation. °· Increasing your fluid intake. °· Extracorporeal shock wave lithotripsy--This is a noninvasive procedure that uses shock waves to break up kidney stones. °· Surgery may be needed if you have severe pain or persistent obstruction. There are various surgical procedures. Most of the procedures are performed with the use of small instruments. Only small incisions are needed to accommodate these instruments, so recovery time is minimized. °The size, location, and chemical composition are all important variables that will determine the proper choice of action for you. Talk to your health care provider to better understand your situation so that you will minimize the risk of injury to yourself and your kidney.  °HOME CARE INSTRUCTIONS  °· Drink enough water and fluids to keep your urine clear or pale yellow. This will help you to pass the stone or stone fragments. °· Strain  all urine through the provided strainer. Keep all particulate matter and stones for your health care provider to see. The stone causing the pain may be as small as a grain of salt. It is very important to use the strainer each and every time you pass your urine. The collection of your stone will allow your health care provider to analyze it and verify that a stone has actually passed. The stone analysis will often identify what you can do to reduce the incidence of recurrences. °· Only take over-the-counter or prescription medicines for pain, discomfort, or fever as directed by your health care provider. °· Keep all follow-up visits as told by your health care provider. This is important. °· Get follow-up X-rays if required. The absence of pain does not always mean that the stone has passed. It may have only stopped moving. If the urine remains completely obstructed, it can cause loss of kidney function or even complete destruction of the kidney. It is your responsibility to make sure X-rays and follow-ups are completed. Ultrasounds of the kidney can show blockages and the status of the kidney. Ultrasounds are not associated with any radiation and can be performed easily in a matter of minutes. °· Make changes to your daily diet as told by your health care provider. You may be told to: °¨ Limit the amount of salt that you eat. °¨ Eat 5 or more servings of fruits and vegetables each day. °¨ Limit the amount of meat, poultry, fish, and eggs that you eat. °· Collect a 24-hour urine sample as told by your health care provider. You may need to collect another urine sample every 6-12   months. °SEEK MEDICAL CARE IF: °· You experience pain that is progressive and unresponsive to any pain medicine you have been prescribed. °SEEK IMMEDIATE MEDICAL CARE IF:  °· Pain cannot be controlled with the prescribed medicine. °· You have a fever or shaking chills. °· The severity or intensity of pain increases over 18 hours and is not  relieved by pain medicine. °· You develop a new onset of abdominal pain. °· You feel faint or pass out. °· You are unable to urinate. °  °This information is not intended to replace advice given to you by your health care provider. Make sure you discuss any questions you have with your health care provider. °  °Document Released: 12/04/2005 Document Revised: 08/25/2015 Document Reviewed: 05/07/2013 °Elsevier Interactive Patient Education ©2016 [employer] ° °

## 2016-03-21 NOTE — ED Notes (Signed)
Pt reports left flank pain radiating to front left quadrant of abdomen this morning. States his urination was intermittent, but no pain or burning reported. Denies hx of kidney stones. Pt is in mild distress, restless and fidgeting on stretcher during assessment.

## 2016-03-22 DIAGNOSIS — K080 Exfoliation of teeth due to systemic causes: Secondary | ICD-10-CM | POA: Diagnosis not present

## 2016-04-06 ENCOUNTER — Emergency Department
Admission: EM | Admit: 2016-04-06 | Discharge: 2016-04-07 | Disposition: A | Discharge status: Home / Self Care | Payer: Federal, State, Local not specified - PPO | Attending: Emergency Medicine | Admitting: Emergency Medicine

## 2016-04-06 ENCOUNTER — Emergency Department: Payer: Federal, State, Local not specified - PPO

## 2016-04-06 DIAGNOSIS — E785 Hyperlipidemia, unspecified: Secondary | ICD-10-CM | POA: Diagnosis not present

## 2016-04-06 DIAGNOSIS — R109 Unspecified abdominal pain: Secondary | ICD-10-CM | POA: Diagnosis present

## 2016-04-06 DIAGNOSIS — K219 Gastro-esophageal reflux disease without esophagitis: Secondary | ICD-10-CM | POA: Insufficient documentation

## 2016-04-06 DIAGNOSIS — I10 Essential (primary) hypertension: Secondary | ICD-10-CM | POA: Diagnosis not present

## 2016-04-06 DIAGNOSIS — R1032 Left lower quadrant pain: Secondary | ICD-10-CM | POA: Diagnosis not present

## 2016-04-06 DIAGNOSIS — Z791 Long term (current) use of non-steroidal anti-inflammatories (NSAID): Secondary | ICD-10-CM | POA: Diagnosis not present

## 2016-04-06 DIAGNOSIS — N200 Calculus of kidney: Secondary | ICD-10-CM | POA: Diagnosis not present

## 2016-04-06 DIAGNOSIS — N201 Calculus of ureter: Secondary | ICD-10-CM | POA: Diagnosis not present

## 2016-04-06 DIAGNOSIS — N132 Hydronephrosis with renal and ureteral calculous obstruction: Secondary | ICD-10-CM | POA: Diagnosis not present

## 2016-04-06 DIAGNOSIS — R3915 Urgency of urination: Secondary | ICD-10-CM | POA: Diagnosis not present

## 2016-04-06 DIAGNOSIS — R350 Frequency of micturition: Secondary | ICD-10-CM | POA: Diagnosis not present

## 2016-04-06 MED ORDER — MORPHINE SULFATE (PF) 4 MG/ML IV SOLN
INTRAVENOUS | Status: AC
  Administered 2016-04-06: 4 mg via INTRAVENOUS
  Filled 2016-04-06: qty 1

## 2016-04-06 MED ORDER — HYDROMORPHONE HCL 1 MG/ML IJ SOLN
1.0000 mg | Freq: Once | INTRAMUSCULAR | Status: AC
  Administered 2016-04-06: 1 mg via INTRAVENOUS

## 2016-04-06 MED ORDER — ONDANSETRON HCL 4 MG/2ML IJ SOLN
4.0000 mg | Freq: Once | INTRAMUSCULAR | Status: AC
  Administered 2016-04-06: 4 mg via INTRAVENOUS

## 2016-04-06 MED ORDER — ONDANSETRON HCL 4 MG/2ML IJ SOLN
INTRAMUSCULAR | Status: AC
Start: 2016-04-06 — End: 2016-04-06
  Administered 2016-04-06: 4 mg via INTRAVENOUS
  Filled 2016-04-06: qty 2

## 2016-04-06 MED ORDER — HYDROMORPHONE HCL 1 MG/ML IJ SOLN
INTRAMUSCULAR | Status: AC
  Administered 2016-04-06: 1 mg via INTRAVENOUS
  Filled 2016-04-06: qty 1

## 2016-04-06 MED ORDER — SODIUM CHLORIDE 0.9 % IV BOLUS (SEPSIS)
1000.0000 mL | Freq: Once | INTRAVENOUS | Status: AC
  Administered 2016-04-06: 1000 mL via INTRAVENOUS

## 2016-04-06 MED ORDER — MORPHINE SULFATE (PF) 4 MG/ML IV SOLN
4.0000 mg | Freq: Once | INTRAVENOUS | Status: AC
  Administered 2016-04-06: 4 mg via INTRAVENOUS

## 2016-04-06 NOTE — ED Notes (Signed)
Pt in with co left flank pain hx of kidney stones.

## 2016-04-06 NOTE — ED Provider Notes (Signed)
[facility] Emergency Department Provider Note  ____________________________________________  Time seen: 12:30 AM  I have reviewed the triage vital signs and the nursing notes.   HISTORY  Chief Complaint Flank Pain    HPI Terry Franco is a 55 y.o. male recently diagnosed with kidney stone on 03/21/2016 presents with acute onset of left flank pain onset tonight with accompanying nausea. Patient states current pain score is 10 out of 10 and nonradiating. Patient denies any aggravating or alleviating factors for his pain     Past Medical History  Diagnosis Date  . Hypertension   . GERD (gastroesophageal reflux disease)   . Allergy     Patient Active Problem List   Diagnosis Date Noted  . Allergic rhinitis 11/16/2015  . GERD (gastroesophageal reflux disease) 10/19/2015  . DDD (degenerative disc disease), lumbar 05/10/2015  . Neuritis or radiculitis due to rupture of lumbar intervertebral disc 08/11/2014  . Muscle spasms of neck 08/11/2014  . BP (high blood pressure) 06/23/2014  . HLD (hyperlipidemia) 06/23/2014    Past Surgical History  Procedure Laterality Date  . Hernia repair      Current Outpatient Rx  Name  Route  Sig  Dispense  Refill  . esomeprazole (NEXIUM) 40 MG capsule   Oral   Take 40 mg by mouth daily.          . Glucosamine-Chondroitin-MSM 750-400-375 MG TABS   Oral   Take 1 tablet by mouth daily.          Marland Kitchen losartan-hydrochlorothiazide (HYZAAR) 100-25 MG per tablet   Oral   Take 1 tablet by mouth daily.          . meloxicam (MOBIC) 15 MG tablet   Oral   Take 15 mg by mouth 2 (two) times daily.          . ondansetron (ZOFRAN) 4 MG tablet   Oral   Take 1 tablet (4 mg total) by mouth daily as needed for nausea or vomiting.   20 tablet   1   . oxyCODONE (ROXICODONE) 5 MG immediate release tablet   Oral   Take 1 tablet (5 mg total) by mouth every 8 (eight) hours as needed.   20 tablet   0   . pravastatin  (PRAVACHOL) 40 MG tablet   Oral   Take 40 mg by mouth daily.         . tamsulosin (FLOMAX) 0.4 MG CAPS capsule   Oral   Take 1 capsule (0.4 mg total) by mouth daily.   7 capsule   0   . tiZANidine (ZANAFLEX) 4 MG tablet   Oral   Take 4 mg by mouth 3 (three) times daily.       0     Allergies Ibuprofen; Penicillin v potassium; and Versed  Family History  Problem Relation Age of Onset  . Cancer Mother     melanoma  . Cancer Father     lung cancer  . Heart disease Father     Social History Social History  Substance Use Topics  . Smoking status: Never Smoker   . Smokeless tobacco: Not on file  . Alcohol Use: 0.0 oz/week    0 Standard drinks or equivalent per week    Review of Systems  Constitutional: Negative for fever. Eyes: Negative for visual changes. ENT: Negative for sore throat. Cardiovascular: Negative for chest pain. Respiratory: Negative for shortness of breath. Gastrointestinal: Negative for abdominal pain, vomiting and diarrhea. Genitourinary: Negative for  dysuria. Musculoskeletal: Positive for left flank pain Skin: Negative for rash. Neurological: Negative for headaches, focal weakness or numbness.   10-point ROS otherwise negative.  ____________________________________________   PHYSICAL EXAM:  VITAL SIGNS: ED Triage Vitals  Enc Vitals Group     BP 04/06/16 2326 134/98 mmHg     Pulse Rate 04/06/16 2326 80     Resp 04/06/16 2326 18     Temp 04/06/16 2327 97.9 F (36.6 C)     Temp Source 04/06/16 2327 Oral     SpO2 04/06/16 2326 97 %     Weight 04/06/16 2326 170 lb (77.111 kg)     Height 04/06/16 2326 5\' 9"  (1.753 m)     Head Cir --      Peak Flow --      Pain Score 04/06/16 2326 8     Pain Loc --      Pain Edu? --      Excl. in [place]? --      Constitutional: Alert and oriented. Apparent discomfort Eyes: Conjunctivae are normal. PERRL. Normal extraocular movements. ENT   Head: Normocephalic and atraumatic.   Nose: No  congestion/rhinnorhea.   Mouth/Throat: Mucous membranes are moist.   Neck: No stridor. Hematological/Lymphatic/Immunilogical: No cervical lymphadenopathy. Cardiovascular: Normal rate, regular rhythm. Normal and symmetric distal pulses are present in all extremities. No murmurs, rubs, or gallops. Respiratory: Normal respiratory effort without tachypnea nor retractions. Breath sounds are clear and equal bilaterally. No wheezes/rales/rhonchi. Gastrointestinal: Soft and nontender. No distention. There is no CVA tenderness. Genitourinary: deferred Musculoskeletal: Nontender with normal range of motion in all extremities. No joint effusions.  No lower extremity tenderness nor edema. Neurologic:  Normal speech and language. No gross focal neurologic deficits are appreciated. Speech is normal.  Skin:  Skin is warm, dry and intact. No rash noted. Psychiatric: Mood and affect are normal. Speech and behavior are normal. Patient exhibits appropriate insight and judgment.  ____________________________________________    LABS (pertinent positives/negatives)  Labs Reviewed  URINALYSIS COMPLETEWITH MICROSCOPIC ([place])  CBC  BASIC METABOLIC PANEL        RADIOLOGY  CT Renal Stone Study (Final result) Result time: 04/07/16 02:14:39   Final result by Rad Results In Interface (04/07/16 02:14:39)   Narrative:   CLINICAL DATA: Left flank pain. History of kidney stones. Patient was seen here 3 weeks ago and found to have 3 mm kidney stone. Worsening left flank pain.  EXAM: CT ABDOMEN AND PELVIS WITHOUT CONTRAST  TECHNIQUE: Multidetector CT imaging of the abdomen and pelvis was performed following the standard protocol without IV contrast.  COMPARISON: 03/21/2016  FINDINGS: Lung bases are clear. 3 mm stone remains in the distal left ureter at the ureterovesical junction. No significant change in position since previous study. Mild dilatation of left ureter and  collecting system. Mild stranding around the left kidney in ureter. No new stones identified. Right kidney and ureter are unremarkable. No bladder wall thickening.  The unenhanced appearance of the liver, spleen, gallbladder, pancreas, adrenal glands, abdominal aorta, inferior vena cava, and retroperitoneal lymph nodes is unremarkable. Stomach, small bowel, and colon are not abnormally distended. Stool fills the colon. No free air or free fluid in the abdomen.  Pelvis: Prostate gland is enlarged, measuring 4.5 cm diameter. No free or loculated pelvic fluid collections. No pelvic mass or lymphadenopathy. No inflammatory infiltration. No destructive bone lesions.  IMPRESSION: 3 mm stone in the distal left ureter without change in position since previous study.   Electronically Signed By:  Lucienne Capers M.D. On: 04/07/2016 02:14          INITIAL IMPRESSION / ASSESSMENT AND PLAN / ED COURSE  Pertinent labs & imaging results that were available during my care of the patient were reviewed by me and considered in my medical decision making (see chart for details).  Dr. [name] requested that patient be given Toradol 15 mg IV. Patient's care transferred to [place] with plan that if pain is not controlled patient will be admitted to Dr. [name]. However pain control is achieved patient can be discharged home with planned appointment with urology today  ____________________________________________   FINAL CLINICAL IMPRESSION(S) / ED DIAGNOSES  Final diagnoses:  Kidney stone      Gregor Hams, MD 04/11/16 [phone]

## 2016-04-07 ENCOUNTER — Encounter: Payer: Self-pay | Admitting: Urology

## 2016-04-07 ENCOUNTER — Ambulatory Visit (INDEPENDENT_AMBULATORY_CARE_PROVIDER_SITE_OTHER): Payer: Federal, State, Local not specified - PPO | Admitting: Urology

## 2016-04-07 VITALS — BP 146/89 | HR 66 | Ht 69.0 in | Wt 180.7 lb

## 2016-04-07 DIAGNOSIS — Z8042 Family history of malignant neoplasm of prostate: Secondary | ICD-10-CM

## 2016-04-07 DIAGNOSIS — N201 Calculus of ureter: Secondary | ICD-10-CM | POA: Diagnosis not present

## 2016-04-07 DIAGNOSIS — N200 Calculus of kidney: Secondary | ICD-10-CM | POA: Diagnosis not present

## 2016-04-07 DIAGNOSIS — R3915 Urgency of urination: Secondary | ICD-10-CM | POA: Diagnosis not present

## 2016-04-07 DIAGNOSIS — R350 Frequency of micturition: Secondary | ICD-10-CM

## 2016-04-07 DIAGNOSIS — N132 Hydronephrosis with renal and ureteral calculous obstruction: Secondary | ICD-10-CM

## 2016-04-07 DIAGNOSIS — R3129 Other microscopic hematuria: Secondary | ICD-10-CM | POA: Diagnosis not present

## 2016-04-07 DIAGNOSIS — R1032 Left lower quadrant pain: Secondary | ICD-10-CM | POA: Diagnosis not present

## 2016-04-07 LAB — URINALYSIS COMPLETE WITH MICROSCOPIC (ARMC ONLY)
BILIRUBIN URINE: NEGATIVE
Bacteria, UA: NONE SEEN
GLUCOSE, UA: NEGATIVE mg/dL
KETONES UR: NEGATIVE mg/dL
Leukocytes, UA: NEGATIVE
NITRITE: NEGATIVE
PROTEIN: NEGATIVE mg/dL
Specific Gravity, Urine: 1.021 (ref 1.005–1.030)
Squamous Epithelial / LPF: NONE SEEN
pH: 6 (ref 5.0–8.0)

## 2016-04-07 LAB — BASIC METABOLIC PANEL
Anion gap: 6 (ref 5–15)
BUN: 22 mg/dL — ABNORMAL HIGH (ref 6–20)
CHLORIDE: 107 mmol/L (ref 101–111)
CO2: 28 mmol/L (ref 22–32)
CREATININE: 1.3 mg/dL — AB (ref 0.61–1.24)
Calcium: 9.3 mg/dL (ref 8.9–10.3)
GFR calc non Af Amer: 60 mL/min — ABNORMAL LOW (ref >60)
GLUCOSE: 114 mg/dL — AB (ref 65–99)
Potassium: 3.4 mmol/L — ABNORMAL LOW (ref 3.5–5.1)
Sodium: 141 mmol/L (ref 135–145)

## 2016-04-07 LAB — CBC
HEMATOCRIT: 39.3 % — AB (ref 40.0–52.0)
HEMOGLOBIN: 14.4 g/dL (ref 13.0–18.0)
MCH: 32.2 pg (ref 26.0–34.0)
MCHC: 36.7 g/dL — AB (ref 32.0–36.0)
MCV: 87.8 fL (ref 80.0–100.0)
Platelets: 284 10*3/uL (ref 150–440)
RBC: 4.48 MIL/uL (ref 4.40–5.90)
RDW: 13.3 % (ref 11.5–14.5)
WBC: 8.9 10*3/uL (ref 3.8–10.6)

## 2016-04-07 LAB — URINALYSIS, COMPLETE
BILIRUBIN UA: NEGATIVE
Glucose, UA: NEGATIVE
Ketones, UA: NEGATIVE
LEUKOCYTES UA: NEGATIVE
Nitrite, UA: NEGATIVE
PH UA: 5.5 (ref 5.0–7.5)
Protein, UA: NEGATIVE
Specific Gravity, UA: 1.025 (ref 1.005–1.030)
Urobilinogen, Ur: 0.2 mg/dL (ref 0.2–1.0)

## 2016-04-07 LAB — MICROSCOPIC EXAMINATION
Bacteria, UA: NONE SEEN
EPITHELIAL CELLS (NON RENAL): NONE SEEN /HPF (ref 0–10)
WBC UA: NONE SEEN /HPF (ref 0–?)

## 2016-04-07 MED ORDER — OXYCODONE-ACETAMINOPHEN 5-325 MG PO TABS: 1.0000 | ORAL_TABLET | Freq: Four times a day (QID) | ORAL | Status: DC | PRN

## 2016-04-07 MED ORDER — TAMSULOSIN HCL 0.4 MG PO CAPS
0.4000 mg | ORAL_CAPSULE | Freq: Once | ORAL | Status: AC
  Administered 2016-04-07: 0.4 mg via ORAL
  Filled 2016-04-07: qty 1

## 2016-04-07 MED ORDER — HYDROMORPHONE HCL 1 MG/ML IJ SOLN
1.0000 mg | Freq: Once | INTRAMUSCULAR | Status: AC
  Administered 2016-04-07: 1 mg via INTRAVENOUS
  Filled 2016-04-07: qty 1

## 2016-04-07 MED ORDER — SODIUM CHLORIDE 0.9 % IV BOLUS (SEPSIS)
1000.0000 mL | Freq: Once | INTRAVENOUS | Status: AC
  Administered 2016-04-07: 1000 mL via INTRAVENOUS

## 2016-04-07 MED ORDER — KETOROLAC TROMETHAMINE 10 MG PO TABS: 10.0000 mg | ORAL_TABLET | Freq: Four times a day (QID) | ORAL | Status: DC | PRN

## 2016-04-07 MED ORDER — HYDROMORPHONE HCL 1 MG/ML IJ SOLN
INTRAMUSCULAR | Status: AC
  Filled 2016-04-07: qty 1

## 2016-04-07 MED ORDER — ONDANSETRON HCL 4 MG/2ML IJ SOLN
4.0000 mg | Freq: Once | INTRAMUSCULAR | Status: AC
  Administered 2016-04-07: 4 mg via INTRAVENOUS

## 2016-04-07 MED ORDER — KETOROLAC TROMETHAMINE 30 MG/ML IJ SOLN
15.0000 mg | Freq: Once | INTRAMUSCULAR | Status: AC
  Administered 2016-04-07: 15 mg via INTRAVENOUS

## 2016-04-07 MED ORDER — DIPHENHYDRAMINE HCL 50 MG/ML IJ SOLN
INTRAMUSCULAR | Status: AC
  Filled 2016-04-07: qty 1

## 2016-04-07 MED ORDER — TAMSULOSIN HCL 0.4 MG PO CAPS: 0.4000 mg | ORAL_CAPSULE | Freq: Every day | ORAL | Status: DC

## 2016-04-07 MED ORDER — ONDANSETRON HCL 4 MG/2ML IJ SOLN
INTRAMUSCULAR | Status: AC
  Filled 2016-04-07: qty 2

## 2016-04-07 MED ORDER — HYDROMORPHONE HCL 1 MG/ML IJ SOLN
1.0000 mg | Freq: Once | INTRAMUSCULAR | Status: AC
  Administered 2016-04-07: 1 mg via INTRAVENOUS

## 2016-04-07 MED ORDER — DIPHENHYDRAMINE HCL 50 MG/ML IJ SOLN
25.0000 mg | Freq: Once | INTRAMUSCULAR | Status: AC
  Administered 2016-04-07: 25 mg via INTRAVENOUS

## 2016-04-07 MED ORDER — KETOROLAC TROMETHAMINE 30 MG/ML IJ SOLN
INTRAMUSCULAR | Status: AC
  Filled 2016-04-07: qty 1

## 2016-04-07 NOTE — ED Notes (Signed)
Bladder scan performed per MD Owens Shark request. Scan showed 20 mL urine

## 2016-04-07 NOTE — ED Notes (Signed)
Urologist at bedside.

## 2016-04-07 NOTE — ED Notes (Signed)
MD Brown at bedside.

## 2016-04-07 NOTE — Discharge Instructions (Signed)
Return to the emergency department for any worsening pain, vomiting unable to keep down your medications, fever, or painful urination.   Kidney Stones Kidney stones (urolithiasis) are deposits that form inside your kidneys. The intense pain is caused by the stone moving through the urinary tract. When the stone moves, the ureter goes into spasm around the stone. The stone is usually passed in the urine.  CAUSES   A disorder that makes certain neck glands produce too much parathyroid hormone (primary hyperparathyroidism).  A buildup of uric acid crystals, similar to gout in your joints.  Narrowing (stricture) of the ureter.  A kidney obstruction present at birth (congenital obstruction).  Previous surgery on the kidney or ureters.  Numerous kidney infections. SYMPTOMS   Feeling sick to your stomach (nauseous).  Throwing up (vomiting).  Blood in the urine (hematuria).  Pain that usually spreads (radiates) to the groin.  Frequency or urgency of urination. DIAGNOSIS   Taking a history and physical exam.  Blood or urine tests.  CT scan.  Occasionally, an examination of the inside of the urinary bladder (cystoscopy) is performed. TREATMENT   Observation.  Increasing your fluid intake.  Extracorporeal shock wave lithotripsy--This is a noninvasive procedure that uses shock waves to break up kidney stones.  Surgery may be needed if you have severe pain or persistent obstruction. There are various surgical procedures. Most of the procedures are performed with the use of small instruments. Only small incisions are needed to accommodate these instruments, so recovery time is minimized. The size, location, and chemical composition are all important variables that will determine the proper choice of action for you. Talk to your health care provider to better understand your situation so that you will minimize the risk of injury to yourself and your kidney.  HOME CARE INSTRUCTIONS     Drink enough water and fluids to keep your urine clear or pale yellow. This will help you to pass the stone or stone fragments.  Strain all urine through the provided strainer. Keep all particulate matter and stones for your health care provider to see. The stone causing the pain may be as small as a grain of salt. It is very important to use the strainer each and every time you pass your urine. The collection of your stone will allow your health care provider to analyze it and verify that a stone has actually passed. The stone analysis will often identify what you can do to reduce the incidence of recurrences.  Only take over-the-counter or prescription medicines for pain, discomfort, or fever as directed by your health care provider.  Keep all follow-up visits as told by your health care provider. This is important.  Get follow-up X-rays if required. The absence of pain does not always mean that the stone has passed. It may have only stopped moving. If the urine remains completely obstructed, it can cause loss of kidney function or even complete destruction of the kidney. It is your responsibility to make sure X-rays and follow-ups are completed. Ultrasounds of the kidney can show blockages and the status of the kidney. Ultrasounds are not associated with any radiation and can be performed easily in a matter of minutes.  Make changes to your daily diet as told by your health care provider. You may be told to:  Limit the amount of salt that you eat.  Eat 5 or more servings of fruits and vegetables each day.  Limit the amount of meat, poultry, fish, and eggs that you  eat.  Collect a 24-hour urine sample as told by your health care provider.You may need to collect another urine sample every 6-12 months. SEEK MEDICAL CARE IF:  You experience pain that is progressive and unresponsive to any pain medicine you have been prescribed. SEEK IMMEDIATE MEDICAL CARE IF:   Pain cannot be controlled  with the prescribed medicine.  You have a fever or shaking chills.  The severity or intensity of pain increases over 18 hours and is not relieved by pain medicine.  You develop a new onset of abdominal pain.  You feel faint or pass out.  You are unable to urinate.   This information is not intended to replace advice given to you by your health care provider. Make sure you discuss any questions you have with your health care provider.   Document Released: 12/04/2005 Document Revised: 08/25/2015 Document Reviewed: 05/07/2013 Elsevier Interactive Patient Education [employer].

## 2016-04-07 NOTE — Progress Notes (Signed)
04/07/2016 4:48 PM   Terry Franco Aug 26, 1961 PO:[id number]  Referring provider: [name], [place], [place] [address]  Chief Complaint  Patient presents with  . Nephrolithiasis    new pt, ER f/u    HPI: Patient is a 55 year old Caucasian male who is referred to [place],  by the [facility] Emergency Department for nephrolithiasis.  Patient states that on April 4 he experienced the sudden onset of left-sided waist pain that radiated into the left flank area.  The pain was colicky in nature.  Attempted to try to control the pain at home, but he was unsuccessful and sought treatment in the ED.  In the ED, he was given IV Dilaudid, Zofran and fluids. A CT renal stone study noted a 3 mm left UVJ stone.  He was then discharged home with analgesics, tamsulosin, Zofran, a strainer and instructed to follow-up with urology.   Then yesterday, he returned to the emergency room due to intractable left-sided flank pain.  At that time, he was anxious to return home with his present level of pain.  He was given a trial of Toradol that was withheld earlier due to his allergy to ibuprofen and found relief.  A repeated CT scan done at that time noted the stone had not changed position.  He was seen by Dr. [name] in the emergency room during his second presentation and Dr. [name] had recommended the Toradol.  He was then discharged from the emergency room with analgesics, to include Toradol, tamsulosin and Zofran.  He presented to [place] this morning for his scheduled appointment after being discharged from the ED.  Currently, he is not experiencing flank pain.  He is exhausted due to the fact that he was up all night in the emergency department. Otherwise, he denies difficulty with urination, fevers, chills, nausea or vomiting.  He is still interested in an ET as definitive treatment for this stone.  He did mention that he was told he had blood in his urine in  November 2016, but this was a urine dip and not a microscopic analysis.   He does have 3-10 RBCs per prior field on today's examination.  He does not have a prior history of nephrolithiasis.  He does not have a family history of nephrolithiasis.  Baseline urinary symptoms consist of nocturia and intermittency.  Denies any erectile dysfunction.  I do not have a recent PSA on file.  His uncle has a history of prostate cancer.   PMH: Past Medical History  Diagnosis Date  . Hypertension   . GERD (gastroesophageal reflux disease)   . Allergy   . Anxiety   . Arthritis   . Depression   . Hyperlipidemia   . Herpes simplex infection     type 1    Surgical History: Past Surgical History  Procedure Laterality Date  . Hernia repair      Home Medications:    Medication List       This list is accurate as of: 04/07/16 11:59 PM.  Always use your most recent med list.               esomeprazole 40 MG capsule  Commonly known as:  NEXIUM  Take 40 mg by mouth daily.     Glucosamine-Chondroitin-MSM 750-400-375 MG Tabs  Take 1 tablet by mouth daily.     ketorolac 10 MG tablet  Commonly known as:  TORADOL  Take 1 tablet (10 mg total)  by mouth every 6 (six) hours as needed.     losartan-hydrochlorothiazide 100-25 MG tablet  Commonly known as:  HYZAAR  Take 1 tablet by mouth daily.     meloxicam 15 MG tablet  Commonly known as:  MOBIC  Take 7.5 mg by mouth 2 (two) times daily.     montelukast 10 MG tablet  Commonly known as:  SINGULAIR  Take 10 mg by mouth daily.     ondansetron 4 MG tablet  Commonly known as:  ZOFRAN  Take 1 tablet (4 mg total) by mouth daily as needed for nausea or vomiting.     oxyCODONE 5 MG immediate release tablet  Commonly known as:  ROXICODONE  Take 1 tablet (5 mg total) by mouth every 8 (eight) hours as needed.     oxyCODONE-acetaminophen 5-325 MG tablet  Commonly known as:  ROXICET  Take 1 tablet by mouth every 6 (six) hours as needed.      pravastatin 40 MG tablet  Commonly known as:  PRAVACHOL  Take 40 mg by mouth daily.     PROAIR RESPICLICK 123XX123 (90 Base) MCG/ACT Aepb  Generic drug:  Albuterol Sulfate  Inhale 2 puffs into the lungs every 4 (four) hours as needed (shortness of breath).     tamsulosin 0.4 MG Caps capsule  Commonly known as:  FLOMAX  Take 1 capsule (0.4 mg total) by mouth daily.     tiZANidine 4 MG tablet  Commonly known as:  ZANAFLEX  Take 4 mg by mouth 3 (three) times daily.        Allergies:  Allergies  Allergen Reactions  . Ibuprofen Swelling  . Versed [Midazolam] Nausea And Vomiting  . Penicillin V Potassium Rash and Other (See Comments)    Has patient had a PCN reaction causing immediate rash, facial/tongue/throat swelling, SOB or lightheadedness with hypotension: unknown Has patient had a PCN reaction causing severe rash involving mucus membranes or skin necrosis: unknown Has patient had a PCN reaction that required hospitalization No Has patient had a PCN reaction occurring within the last 10 years: No If all of the above answers are "NO", then may proceed with Cephalosporin use.     Family History: Family History  Problem Relation Age of Onset  . Cancer Mother     melanoma  . Cancer Father     lung cancer  . Heart disease Father   . Benign prostatic hyperplasia Father   . Prostate cancer Paternal Uncle   . Kidney cancer Neg Hx   . Kidney disease Neg Hx   . Benign prostatic hyperplasia Brother   . Urolithiasis Brother     Social History:  reports that he has never smoked. He does not have any smokeless tobacco history on file. He reports that he drinks alcohol. He reports that he does not use illicit drugs.  ROS: UROLOGY Frequent Urination?: No Hard to postpone urination?: No Burning/pain with urination?: No Get up at night to urinate?: Yes Leakage of urine?: No Urine stream starts and stops?: Yes Trouble starting stream?: No Do you have to strain to urinate?:  No Blood in urine?: Yes Urinary tract infection?: No Sexually transmitted disease?: No Injury to kidneys or bladder?: No Painful intercourse?: No Weak stream?: No Erection problems?: No Penile pain?: No  Gastrointestinal Nausea?: Yes Vomiting?: No Indigestion/heartburn?: No Diarrhea?: Yes Constipation?: No  Constitutional Fever: No Night sweats?: No Weight loss?: No Fatigue?: Yes  Skin Skin rash/lesions?: No Itching?: No  Eyes Blurred vision?: No Double  vision?: No  Ears/Nose/Throat Sore throat?: Yes Sinus problems?: Yes  Hematologic/Lymphatic Swollen glands?: No Easy bruising?: No  Cardiovascular Leg swelling?: No Chest pain?: No  Respiratory Cough?: No Shortness of breath?: No  Endocrine Excessive thirst?: No  Musculoskeletal Back pain?: Yes Joint pain?: No  Neurological Headaches?: Yes Dizziness?: No  Psychologic Depression?: No Anxiety?: Yes  Physical Exam: BP 146/89 mmHg  Pulse 66  Ht 5\' 9"  (1.753 m)  Wt 180 lb 11.2 oz (81.965 kg)  BMI 26.67 kg/m2  Constitutional: Well nourished. Alert and oriented, No acute distress. HEENT: [place] AT, moist mucus membranes. Trachea midline, no masses. Cardiovascular: No clubbing, cyanosis, or edema. Respiratory: Normal respiratory effort, no increased work of breathing. GI: Abdomen is soft, non tender, non distended, no abdominal masses. Liver and spleen not palpable.  No hernias appreciated.  Stool sample for occult testing is not indicated.   GU: No CVA tenderness.  No bladder fullness or masses.   Skin: No rashes, bruises or suspicious lesions. Lymph: No cervical or inguinal adenopathy. Neurologic: Grossly intact, no focal deficits, moving all 4 extremities. Psychiatric: Normal mood and affect.  Laboratory Data: Lab Results  Component Value Date   WBC 8.9 04/06/2016   HGB 14.4 04/06/2016   HCT 39.3* 04/06/2016   MCV 87.8 04/06/2016   PLT 284 04/06/2016    Lab Results  Component Value  Date   CREATININE 1.30* 04/06/2016       Component Value Date/Time   CHOL 211* 07/08/2015 0923   HDL 42 07/08/2015 0923   CHOLHDL 5.0 07/08/2015 0923   LDLCALC 126* 07/08/2015 0923    Lab Results  Component Value Date   AST 29 03/21/2016   Lab Results  Component Value Date   ALT 41 03/21/2016     Urinalysis Microscopic Examination  Result Value Ref Range   WBC, UA None seen 0 -  5 /hpf   RBC, UA 3-10 (A) 0 -  2 /hpf   Epithelial Cells (non renal) None seen 0 - 10 /hpf   Bacteria, UA None seen None seen/Few  Urinalysis, Complete  Result Value Ref Range   Specific Gravity, UA 1.025 1.005 - 1.030   pH, UA 5.5 5.0 - 7.5   Color, UA Yellow Yellow   Appearance Ur Clear Clear   Leukocytes, UA Negative Negative   Protein, UA Negative Negative/Trace   Glucose, UA Negative Negative   Ketones, UA Negative Negative   RBC, UA 1+ (A) Negative   Bilirubin, UA Negative Negative   Urobilinogen, Ur 0.2 0.2 - 1.0 mg/dL   Nitrite, UA Negative Negative   Microscopic Examination See below:     Pertinent Imaging: CLINICAL DATA: Left flank pain. History of kidney stones. Patient was seen here 3 weeks ago and found to have 3 mm kidney stone. Worsening left flank pain.  EXAM: CT ABDOMEN AND PELVIS WITHOUT CONTRAST  TECHNIQUE: Multidetector CT imaging of the abdomen and pelvis was performed following the standard protocol without IV contrast.  COMPARISON: 03/21/2016  FINDINGS: Lung bases are clear. 3 mm stone remains in the distal left ureter at the ureterovesical junction. No significant change in position since previous study. Mild dilatation of left ureter and collecting system. Mild stranding around the left kidney in ureter. No new stones identified. Right kidney and ureter are unremarkable. No bladder wall thickening.  The unenhanced appearance of the liver, spleen, gallbladder, pancreas, adrenal glands, abdominal aorta, inferior vena cava, and retroperitoneal  lymph nodes is unremarkable. Stomach, small bowel, and colon  are not abnormally distended. Stool fills the colon. No free air or free fluid in the abdomen.  Pelvis: Prostate gland is enlarged, measuring 4.5 cm diameter. No free or loculated pelvic fluid collections. No pelvic mass or lymphadenopathy. No inflammatory infiltration. No destructive bone lesions.  IMPRESSION: 3 mm stone in the distal left ureter without change in position since previous study.   Electronically Signed  By: Lucienne Capers M.D.  On: 04/07/2016 02:14  Assessment & Plan:    1. Left ureteral stone:   Patient was found to have a left UVJ stone confirmed by 2 CT renal stone studies.  I encouraged him to increase his fluid intake to 2.5 L daily.  I stated at this point the type of fluid is not particularly important as the volume.  He may drink a beer if he has not taken a narcotic for pain.   He is to continue his tamsulosin 0.4 mg daily and strain the urine.  If he should capture a stone, I would like him to bring the stone to our office for analysis.   He is to contact our office if he should develop fevers/chills or intractable pain.  Otherwise, he will follow-up in one to 2 weeks with KUB, UA and office visit.  - CULTURE, URINE COMPREHENSIVE - Urinalysis, Complete  2. Left hydronephrosis:   Patient was found to have left hydronephrosis due to an UVJ stone.  A RUS will be obtained one month after patient has passed the ureteral stone to ensure the hydronephrosis has resolved.    3. Microscopic hematuria:   We will continue to monitor the patient's UA after the passage of the stone to ensure the hematuria has resolved.  If hematuria persists, we will pursue a hematuria workup with CT Urogram and cystoscopy if appropriate.  4. Family history of prostate cancer:   Patient's uncle has a history of prostate cancer.  I do not see a recent PSA on file.  When patient has recovered from his stone, we will discuss  PSA screening as it is recommended at his age by AUA guidelines.   Return in about 1 week (around 04/14/2016) for KUB and office visit.  These notes generated with voice recognition software. I apologize for typographical errors.  Zara Council, [facility] [address], [place], [place] [address] [phone]

## 2016-04-07 NOTE — ED Notes (Signed)
Patient unable to provide urine sample. Reports that be voided just PTA. Patient advises that he has been having difficulty with micturition over the last week.

## 2016-04-07 NOTE — Consult Note (Signed)
I have been asked to see the patient by Dr. Marjean Donna, MD, for evaluation and management of left distal ureteral stone.  History of present illness: 55M who presented today to the emergency department with recurrent left suprapubic pain radiating to the left flank. The patient had associated increased urinary frequency and urgency. He had associated nausea, no vomiting. He denies any fevers or chills. He was previously seen for a 3 mm left distal ureteral stone on 03/21/16. He was discharged with 3 days of Flomax as well as pain medication. A CT scan was obtained in the emergency department demonstrating that the stone had not really moved over the course of the last 3 weeks. In the emergency room the patient was given several rounds of IV pain medication and was unwilling to go home with his pain level. Notably, the patient has an allergy to ibuprofen and the patient was not given Toradol.  Review of systems: A 12 point comprehensive review of systems was obtained and is negative unless otherwise stated in the history of present illness.  Patient Active Problem List   Diagnosis Date Noted  . Allergic rhinitis 11/16/2015  . GERD (gastroesophageal reflux disease) 10/19/2015  . DDD (degenerative disc disease), lumbar 05/10/2015  . Neuritis or radiculitis due to rupture of lumbar intervertebral disc 08/11/2014  . Muscle spasms of neck 08/11/2014  . BP (high blood pressure) 06/23/2014  . HLD (hyperlipidemia) 06/23/2014    No current facility-administered medications on file prior to encounter.   Current Outpatient Prescriptions on File Prior to Encounter  Medication Sig Dispense Refill  . esomeprazole (NEXIUM) 40 MG capsule Take 40 mg by mouth daily.     . Glucosamine-Chondroitin-MSM 750-400-375 MG TABS Take 1 tablet by mouth daily.     Marland Kitchen losartan-hydrochlorothiazide (HYZAAR) 100-25 MG per tablet Take 1 tablet by mouth daily.     . meloxicam (MOBIC) 15 MG tablet Take 7.5 mg by mouth 2 (two)  times daily.     . ondansetron (ZOFRAN) 4 MG tablet Take 1 tablet (4 mg total) by mouth daily as needed for nausea or vomiting. 20 tablet 1  . oxyCODONE (ROXICODONE) 5 MG immediate release tablet Take 1 tablet (5 mg total) by mouth every 8 (eight) hours as needed. 20 tablet 0  . pravastatin (PRAVACHOL) 40 MG tablet Take 40 mg by mouth daily.    Marland Kitchen tiZANidine (ZANAFLEX) 4 MG tablet Take 4 mg by mouth 3 (three) times daily.   0    Past Medical History  Diagnosis Date  . Hypertension   . GERD (gastroesophageal reflux disease)   . Allergy     Past Surgical History  Procedure Laterality Date  . Hernia repair      Social History  Substance Use Topics  . Smoking status: Never Smoker   . Smokeless tobacco: Not on file  . Alcohol Use: 0.0 oz/week    0 Standard drinks or equivalent per week    Family History  Problem Relation Age of Onset  . Cancer Mother     melanoma  . Cancer Father     lung cancer  . Heart disease Father     PE: Filed Vitals:   04/07/16 0300 04/07/16 0330 04/07/16 0400 04/07/16 0430  BP: 151/93 145/95 153/99 155/97  Pulse: 64 60 74 71  Temp:      TempSrc:      Resp:    16  Height:      Weight:      SpO2: 97%  93% 98% 95%   Patient appears to be in no acute distress  patient is alert and oriented x3 Atraumatic normocephalic head No cervical or supraclavicular lymphadenopathy appreciated No increased work of breathing, no audible wheezes/rhonchi Regular sinus rhythm/rate Abdomen is soft, nontender, nondistended, no CVA or suprapubic tenderness Lower extremities are symmetric without appreciable edema Grossly neurologically intact No identifiable skin lesions   Recent Labs  04/06/16 2347  WBC 8.9  HGB 14.4  HCT 39.3*    Recent Labs  04/06/16 2347  NA 141  K 3.4*  CL 107  CO2 28  GLUCOSE 114*  BUN 22*  CREATININE 1.30*  CALCIUM 9.3   No results for input(s): LABPT, INR in the last 72 hours. No results for input(s): LABURIN in the  last 72 hours. Results for orders placed or performed in visit on 10/21/15  Urine culture     Status: None   Collection Time: 10/21/15 11:00 AM  Result Value Ref Range Status   Urine Culture, Routine Final report  Final   Urine Culture result 1 No growth  Final    Imaging: I've and apparently reviewed the patient's CT scan demonstrating a 3 mm stone at the UVJ with mild hydronephrosis on the left side.  Imp: 55 year old male with a small very distal left ureteral stone with mild hydronephrosis. He has associated voiding symptoms.  Recommendations: I recommended a challenge with Toradol while the patient is closely monitored to test his allergy. If his pain is controlled after Toradol, recommended continued medical expulsion therapy, this time with a prolonged course of Flomax. The patient is scheduled to see Dr. Erlene Quan today in clinic, he would cancel this if he was able to go home and reschedule for several weeks from now. If the patient's pain is on controlled after Toradol, we will plan to admit the patient and add him onto the end of the day for ureteroscopy. [name] W

## 2016-04-07 NOTE — ED Notes (Signed)
MD aware of pt's allergy to ibuprofen. MD ordered pt placed on cardiac monitoring and toradol given.

## 2016-04-07 NOTE — ED Notes (Signed)
Pt's O2 saturation decreased to 86% on RA. Pt placed on 1L [place]. Pt's O2 sat currently 95%

## 2016-04-07 NOTE — ED Provider Notes (Signed)
-----------------------------------------   8:09 AM on 04/07/2016 -----------------------------------------  Pain is well-controlled at this time. Patient has a urology appointment at 10:30 we will discharge with oral Toradol, and have the patient follow-up with urology. I discussed with the patient not to take his meloxicam all taking Toradol. We will also prescribe Flomax. I discussed restrict return precautions to which the patient is agreeable.  Harvest Dark, MD 04/07/16 [phone]

## 2016-04-07 NOTE — ED Notes (Signed)
AAOx3.  Skin warm and dry.  NAD.  D/C home 

## 2016-04-07 NOTE — Consult Note (Addendum)
Patient 41mm UVJ stone with mild hydronephrosis and no evidence of infection.  He has been to ED twice in past two weeks for same stone, which has not moved in that time.  He had three days of flomax.  Recommended continued medical explusion therapy and f/u with urology as outpatient as scheduled.

## 2016-04-07 NOTE — ED Notes (Signed)
Pt placed on cardiac monitoring.  

## 2016-04-09 DIAGNOSIS — N132 Hydronephrosis with renal and ureteral calculous obstruction: Secondary | ICD-10-CM | POA: Insufficient documentation

## 2016-04-09 DIAGNOSIS — Z8042 Family history of malignant neoplasm of prostate: Secondary | ICD-10-CM | POA: Insufficient documentation

## 2016-04-09 DIAGNOSIS — N201 Calculus of ureter: Secondary | ICD-10-CM | POA: Insufficient documentation

## 2016-04-09 DIAGNOSIS — R3129 Other microscopic hematuria: Secondary | ICD-10-CM | POA: Insufficient documentation

## 2016-04-12 LAB — CULTURE, URINE COMPREHENSIVE

## 2016-04-14 ENCOUNTER — Telehealth: Payer: Self-pay | Admitting: Urology

## 2016-04-14 NOTE — Telephone Encounter (Signed)
Terry Franco called after he rescheduled his appointment realizing he'll be out of Toradol in two days. He's wondering if [name] can send a refill to his pharmacy even though it was prescribed while he was in the ER. Please give him a phone call.  Pt's ph# [phone] Thank you.

## 2016-04-14 NOTE — Telephone Encounter (Signed)
Toradol  can only be taken safely for five days.  I gave him the maximum amount.  He will need to take another pain medication.  We will need to ask one of the physicians to prescribe a medication if appropriate.

## 2016-04-18 NOTE — Telephone Encounter (Signed)
Spoke with pt in reference to pain and toradol. Pt stated that he is feeling better, his pain is mostly more of a cramp than anything, and he was just wanting more pain medication has he was told in the ER by a urologist that toradol helps with inflammation when passing a stone. Reinforced with pt that he can alternate toradol and ibuprofen if inflammation is his biggest concern. Also made pt aware he was given the maximum about of tordaol that can be safely taken. Pt voiced understanding.

## 2016-04-21 ENCOUNTER — Ambulatory Visit: Payer: Federal, State, Local not specified - PPO | Admitting: Urology

## 2016-04-26 ENCOUNTER — Ambulatory Visit
Admission: RE | Admit: 2016-04-26 | Discharge: 2016-04-26 | Disposition: A | Discharge status: Home / Self Care | Payer: Federal, State, Local not specified - PPO | Source: Ambulatory Visit | Attending: Urology | Admitting: Urology

## 2016-04-26 DIAGNOSIS — N201 Calculus of ureter: Secondary | ICD-10-CM | POA: Insufficient documentation

## 2016-05-01 ENCOUNTER — Telehealth: Payer: Self-pay

## 2016-05-01 ENCOUNTER — Ambulatory Visit
Admission: RE | Admit: 2016-05-01 | Discharge: 2016-05-01 | Disposition: A | Discharge status: Home / Self Care | Payer: Federal, State, Local not specified - PPO | Source: Ambulatory Visit | Attending: Urology | Admitting: Urology

## 2016-05-01 ENCOUNTER — Telehealth: Payer: Self-pay | Admitting: Urology

## 2016-05-01 ENCOUNTER — Ambulatory Visit (INDEPENDENT_AMBULATORY_CARE_PROVIDER_SITE_OTHER): Payer: Federal, State, Local not specified - PPO | Admitting: Urology

## 2016-05-01 VITALS — BP 160/94 | HR 68 | Ht 69.0 in | Wt 175.0 lb

## 2016-05-01 DIAGNOSIS — R109 Unspecified abdominal pain: Secondary | ICD-10-CM | POA: Diagnosis not present

## 2016-05-01 DIAGNOSIS — N201 Calculus of ureter: Secondary | ICD-10-CM | POA: Diagnosis not present

## 2016-05-01 DIAGNOSIS — R3129 Other microscopic hematuria: Secondary | ICD-10-CM | POA: Diagnosis not present

## 2016-05-01 DIAGNOSIS — N132 Hydronephrosis with renal and ureteral calculous obstruction: Secondary | ICD-10-CM

## 2016-05-01 LAB — URINALYSIS, COMPLETE
BILIRUBIN UA: NEGATIVE
GLUCOSE, UA: NEGATIVE
KETONES UA: NEGATIVE
Leukocytes, UA: NEGATIVE
NITRITE UA: NEGATIVE
Protein, UA: NEGATIVE
SPEC GRAV UA: 1.025 (ref 1.005–1.030)
UUROB: 0.2 mg/dL (ref 0.2–1.0)
pH, UA: 5 (ref 5.0–7.5)

## 2016-05-01 LAB — MICROSCOPIC EXAMINATION: EPITHELIAL CELLS (NON RENAL): NONE SEEN /HPF (ref 0–10)

## 2016-05-01 NOTE — Telephone Encounter (Signed)
Per [name] pt RUS was negative, no hydronephrosis. Per [name] pt could either give the stone a chance to pass or schedule surgery with the understanding the stone maybe gone. Spoke with pt in reference to RUS results and Shannon's options. Pt stated that he would call back when a decision.

## 2016-05-01 NOTE — Progress Notes (Signed)
10:33 PM   Terry Franco 1960-12-22 PO:[id number]  Referring provider: [name], [place], [place] [address]  Chief Complaint  Patient presents with  . Nephrolithiasis    1wk    HPI: Patient is a 71 show Caucasian male who presents today for follow-up for a 3 mm left UVJ stone.  Background history Patient was referred to [place],  by the [facility] Emergency Department for nephrolithiasis.  A CT renal stone study performed on 03/21/2016 noted a 3 mm left UVJ stone.   A repeated CT scan done on 04/06/2016 noted the stone had not changed position.  He was seen by Dr. [name] in the emergency room during his second presentation.  He does not have a prior history of nephrolithiasis.  He does not have a family history of nephrolithiasis.  Baseline urinary symptoms consist of nocturia and intermittency.  Denies any erectile dysfunction.  I do not have a recent PSA on file.  His uncle has a history of prostate cancer.  Today, he is experiencing pain in the left lower quadrant. It does radiate into the left back. He has not passed any fragments. He is also been experiencing fatigue and gross hematuria.  The pain is not as severe as it was when he went to the emergency room, but this fatigue is wearing on him.  His UA today is unremarkable.  He taken today notes the possibility that the left UVJ stone remains. I have personally reviewed the films with the patient.  PMH: Past Medical History  Diagnosis Date  . Hypertension   . GERD (gastroesophageal reflux disease)   . Allergy   . Anxiety   . Arthritis   . Depression   . Hyperlipidemia   . Herpes simplex infection     type 1    Surgical History: Past Surgical History  Procedure Laterality Date  . Hernia repair      Home Medications:    Medication List       This list is accurate as of: 05/01/16 11:59 PM.  Always use your most recent med list.               esomeprazole 40 MG capsule  Commonly known as:  NEXIUM  Take 40 mg by mouth daily.     Glucosamine-Chondroitin-MSM 750-400-375 MG Tabs  Take 1 tablet by mouth daily.     ketorolac 10 MG tablet  Commonly known as:  TORADOL  Take 1 tablet (10 mg total) by mouth every 6 (six) hours as needed.     losartan-hydrochlorothiazide 100-25 MG tablet  Commonly known as:  HYZAAR  Take 1 tablet by mouth daily.     meloxicam 15 MG tablet  Commonly known as:  MOBIC  Take 7.5 mg by mouth 2 (two) times daily.     montelukast 10 MG tablet  Commonly known as:  SINGULAIR  Take 10 mg by mouth daily.     pravastatin 40 MG tablet  Commonly known as:  PRAVACHOL  Take 40 mg by mouth daily.     PROAIR RESPICLICK 123XX123 (90 Base) MCG/ACT Aepb  Generic drug:  Albuterol Sulfate  Inhale 2 puffs into the lungs every 4 (four) hours as needed (shortness of breath).     tamsulosin 0.4 MG Caps capsule  Commonly known as:  FLOMAX  Take 1 capsule (0.4 mg total) by mouth daily.     tiZANidine 4 MG tablet  Commonly known as:  ZANAFLEX  Take 4 mg by mouth 3 (three) times daily.        Allergies:  Allergies  Allergen Reactions  . Ibuprofen Swelling  . Versed [Midazolam] Nausea And Vomiting  . Penicillin V Potassium Rash and Other (See Comments)    Has patient had a PCN reaction causing immediate rash, facial/tongue/throat swelling, SOB or lightheadedness with hypotension: unknown Has patient had a PCN reaction causing severe rash involving mucus membranes or skin necrosis: unknown Has patient had a PCN reaction that required hospitalization No Has patient had a PCN reaction occurring within the last 10 years: No If all of the above answers are "NO", then may proceed with Cephalosporin use.     Family History: Family History  Problem Relation Age of Onset  . Cancer Mother     melanoma  . Cancer Father     lung cancer  . Heart disease Father   . Benign prostatic hyperplasia Father   . Prostate  cancer Paternal Uncle   . Kidney cancer Neg Hx   . Kidney disease Neg Hx   . Benign prostatic hyperplasia Brother   . Urolithiasis Brother     Social History:  reports that he has never smoked. He does not have any smokeless tobacco history on file. He reports that he drinks alcohol. He reports that he does not use illicit drugs.  ROS: UROLOGY Frequent Urination?: No Hard to postpone urination?: No Burning/pain with urination?: No Get up at night to urinate?: No Leakage of urine?: No Urine stream starts and stops?: No Trouble starting stream?: No Do you have to strain to urinate?: No Blood in urine?: No Urinary tract infection?: No Sexually transmitted disease?: No Injury to kidneys or bladder?: No Painful intercourse?: No Weak stream?: No Erection problems?: No Penile pain?: No  Gastrointestinal Nausea?: No Vomiting?: No Indigestion/heartburn?: No Diarrhea?: No Constipation?: No  Constitutional Fever: No Night sweats?: No Weight loss?: No Fatigue?: Yes  Skin Skin rash/lesions?: No Itching?: No  Eyes Blurred vision?: No Double vision?: No  Ears/Nose/Throat Sore throat?: No Sinus problems?: Yes  Hematologic/Lymphatic Swollen glands?: No Easy bruising?: No  Cardiovascular Leg swelling?: No Chest pain?: No  Respiratory Cough?: No Shortness of breath?: No  Endocrine Excessive thirst?: No  Musculoskeletal Back pain?: Yes Joint pain?: No  Neurological Headaches?: No Dizziness?: No  Psychologic Depression?: No Anxiety?: No  Physical Exam: BP 160/94 mmHg  Pulse 68  Ht 5\' 9"  (1.753 m)  Wt 175 lb (79.379 kg)  BMI 25.83 kg/m2  Constitutional: Well nourished. Alert and oriented, No acute distress. HEENT: [place] AT, moist mucus membranes. Trachea midline, no masses. Cardiovascular: No clubbing, cyanosis, or edema. Respiratory: Normal respiratory effort, no increased work of breathing. GI: Abdomen is soft, non tender, non distended, no  abdominal masses. Liver and spleen not palpable.  No hernias appreciated.  Stool sample for occult testing is not indicated.   GU: No CVA tenderness.  No bladder fullness or masses.   Skin: No rashes, bruises or suspicious lesions. Lymph: No cervical or inguinal adenopathy. Neurologic: Grossly intact, no focal deficits, moving all 4 extremities. Psychiatric: Normal mood and affect.  Laboratory Data: Lab Results  Component Value Date   WBC 8.9 04/06/2016   HGB 14.4 04/06/2016   HCT 39.3* 04/06/2016   MCV 87.8 04/06/2016   PLT 284 04/06/2016    Lab Results  Component Value Date   CREATININE 1.30* 04/06/2016       Component Value Date/Time   CHOL 211* 07/08/2015 [id number]  HDL 42 07/08/2015 0923   CHOLHDL 5.0 07/08/2015 0923   LDLCALC 126* 07/08/2015 0923    Lab Results  Component Value Date   AST 29 03/21/2016   Lab Results  Component Value Date   ALT 41 03/21/2016     Urinalysis Results for orders placed or performed in visit on 05/01/16  Microscopic Examination  Result Value Ref Range   WBC, UA 0-5 0 -  5 /hpf   RBC, UA 0-2 0 -  2 /hpf   Epithelial Cells (non renal) None seen 0 - 10 /hpf   Crystals Present (A) N/A   Crystal Type Amorphous Sediment N/A   Mucus, UA Present (A) Not Estab.   Bacteria, UA Few (A) None seen/Few  Urinalysis, Complete  Result Value Ref Range   Specific Gravity, UA 1.025 1.005 - 1.030   pH, UA 5.0 5.0 - 7.5   Color, UA Yellow Yellow   Appearance Ur Clear Clear   Leukocytes, UA Negative Negative   Protein, UA Negative Negative/Trace   Glucose, UA Negative Negative   Ketones, UA Negative Negative   RBC, UA Trace (A) Negative   Bilirubin, UA Negative Negative   Urobilinogen, Ur 0.2 0.2 - 1.0 mg/dL   Nitrite, UA Negative Negative   Microscopic Examination See below:      Pertinent Imaging: CLINICAL DATA: Left ureteral stone.  EXAM: ABDOMEN - 1 VIEW  COMPARISON: 04/06/2016  FINDINGS: Vague calcification projects to the  left of midline in the lower pelvis. This could reflect the previously seen small distal left ureteral stone. This is difficult to confirm without cross-sectional imaging as there also is a small phlebolith in the left pelvis on prior CT. Normal bowel gas pattern. No free air organomegaly. No acute bony abnormality.  IMPRESSION: Small calcification in the left lower pelvis could reflect the previously seen distal left ureteral stone. This could be confirmed with CT urogram if felt clinically indicated.   Electronically Signed  By: Rolm Baptise M.D.  On: 04/26/2016 11:29          Assessment & Plan:    1. Left ureteral stone:   Patient does not believe that he has passed his stone. I have recommended that he undergo a renal ultrasound as we can achieve this exam fairly quickly and if hydronephrosis is found, he most likely has not passed the stone.  - Urinalysis, Complete  2. Left hydronephrosis:   Patient was found to have left hydronephrosis due to an UVJ stone.  A RUS will be obtained today.   3. Microscopic hematuria:   No hematuria on today's exam.  4. Family history of prostate cancer:   Patient's uncle has a history of prostate cancer.  I do not see a recent PSA on file.  When patient has recovered from his stone, we will discuss PSA screening as it is recommended at his age by AUA guidelines.   Return for pending imaging studies.  These notes generated with voice recognition software. I apologize for typographical errors.  Zara Council, PA-C  [facility] [address] [place], [place] [address] [phone]  Addendum:   Renal ultrasound did not demonstrate any hydronephrosis.  I discussed with the patient undergoing a cystoscopy with retrograde and if stone is still present having and URS/LL/ureteral stent placement for undergoing another CT renal stone study.  I advised the patient of the risks of the surgical  procedure and the risks of more radiation exposure with another CT scan. I  also voiced my concerns that his insurance may not cover another CT scan in such a short time period.  He would like to pursue a CT scan if his insurance will cover the exam.

## 2016-05-01 NOTE — Telephone Encounter (Signed)
I spoke with the patient regarding his RUS results.  I offered retrograde and URS/LL/ureteral stent placement or repeating another CT scan.  I stated that he has had two CT scans recently and this increases his radiation exposure.  He would like to undergo the CT scan prior to undergoing a surgical procedure.  Please schedule his CT scan report appointment.

## 2016-05-02 NOTE — Telephone Encounter (Signed)
Ok I will have to see if his insurance will approve another one and I will let him know   Sharyn Lull

## 2016-05-03 ENCOUNTER — Encounter: Payer: Self-pay | Admitting: Urology

## 2016-05-04 ENCOUNTER — Other Ambulatory Visit: Payer: Self-pay | Admitting: Family Medicine

## 2016-05-07 ENCOUNTER — Telehealth: Payer: Self-pay | Admitting: Urology

## 2016-05-07 NOTE — Telephone Encounter (Signed)
Patient's CT scan is ordered, but I don't see that it has been scheduled.  Would you check into this?

## 2016-05-08 NOTE — Telephone Encounter (Signed)
I called and they said they are not sure why he has not been called and they would call him today to schedule   Sharyn Lull

## 2016-05-12 ENCOUNTER — Ambulatory Visit
Admission: RE | Admit: 2016-05-12 | Discharge: 2016-05-12 | Disposition: A | Discharge status: Home / Self Care | Payer: Federal, State, Local not specified - PPO | Source: Ambulatory Visit | Attending: Urology | Admitting: Urology

## 2016-05-12 DIAGNOSIS — R109 Unspecified abdominal pain: Secondary | ICD-10-CM | POA: Insufficient documentation

## 2016-05-12 DIAGNOSIS — N201 Calculus of ureter: Secondary | ICD-10-CM

## 2016-05-12 NOTE — Telephone Encounter (Signed)
Spoke with pt in reference to stone, PSA, and DRE. Pt voiced understanding. Pt asked does he need to keep his appt next week?

## 2016-05-12 NOTE — Telephone Encounter (Signed)
Unless he wants to keep that appointment to have his PSA checked and a DRE performed, no he does not need to keep the appointment next week.

## 2016-05-12 NOTE — Telephone Encounter (Signed)
-----   Message from Nori Riis, PA-C sent at 05/12/2016  8:41 AM EDT ----- I left word with the patient to call back.  His stone has passed.  I do recommend that he have annual PSA's and DRE's due to his age and family history of prostate cancer.  We can perform these tests or he can see his PCP.

## 2016-05-16 NOTE — Telephone Encounter (Signed)
He called back and said he wanted to cancel  [facility]

## 2016-05-19 ENCOUNTER — Ambulatory Visit: Payer: Federal, State, Local not specified - PPO | Admitting: Urology

## 2016-05-30 ENCOUNTER — Other Ambulatory Visit: Payer: Self-pay | Admitting: Family Medicine

## 2016-05-30 DIAGNOSIS — K120 Recurrent oral aphthae: Secondary | ICD-10-CM

## 2016-05-30 MED ORDER — VALACYCLOVIR HCL 1 G PO TABS: 1000.0000 mg | ORAL_TABLET | Freq: Two times a day (BID) | ORAL | Status: DC

## 2016-06-05 IMAGING — CR DG CHEST 2V
2 series · 2 of 2 positions shown · non-contrast
Comparison: 10/21/2015

CLINICAL DATA: History of pneumonia diagnosed on 10/21/2015.
Patient is taken 10 days of antibiotics and now cough is worsening.
History of hypertension and HIV.

EXAM:
CHEST  2 VIEW

[chest pa]
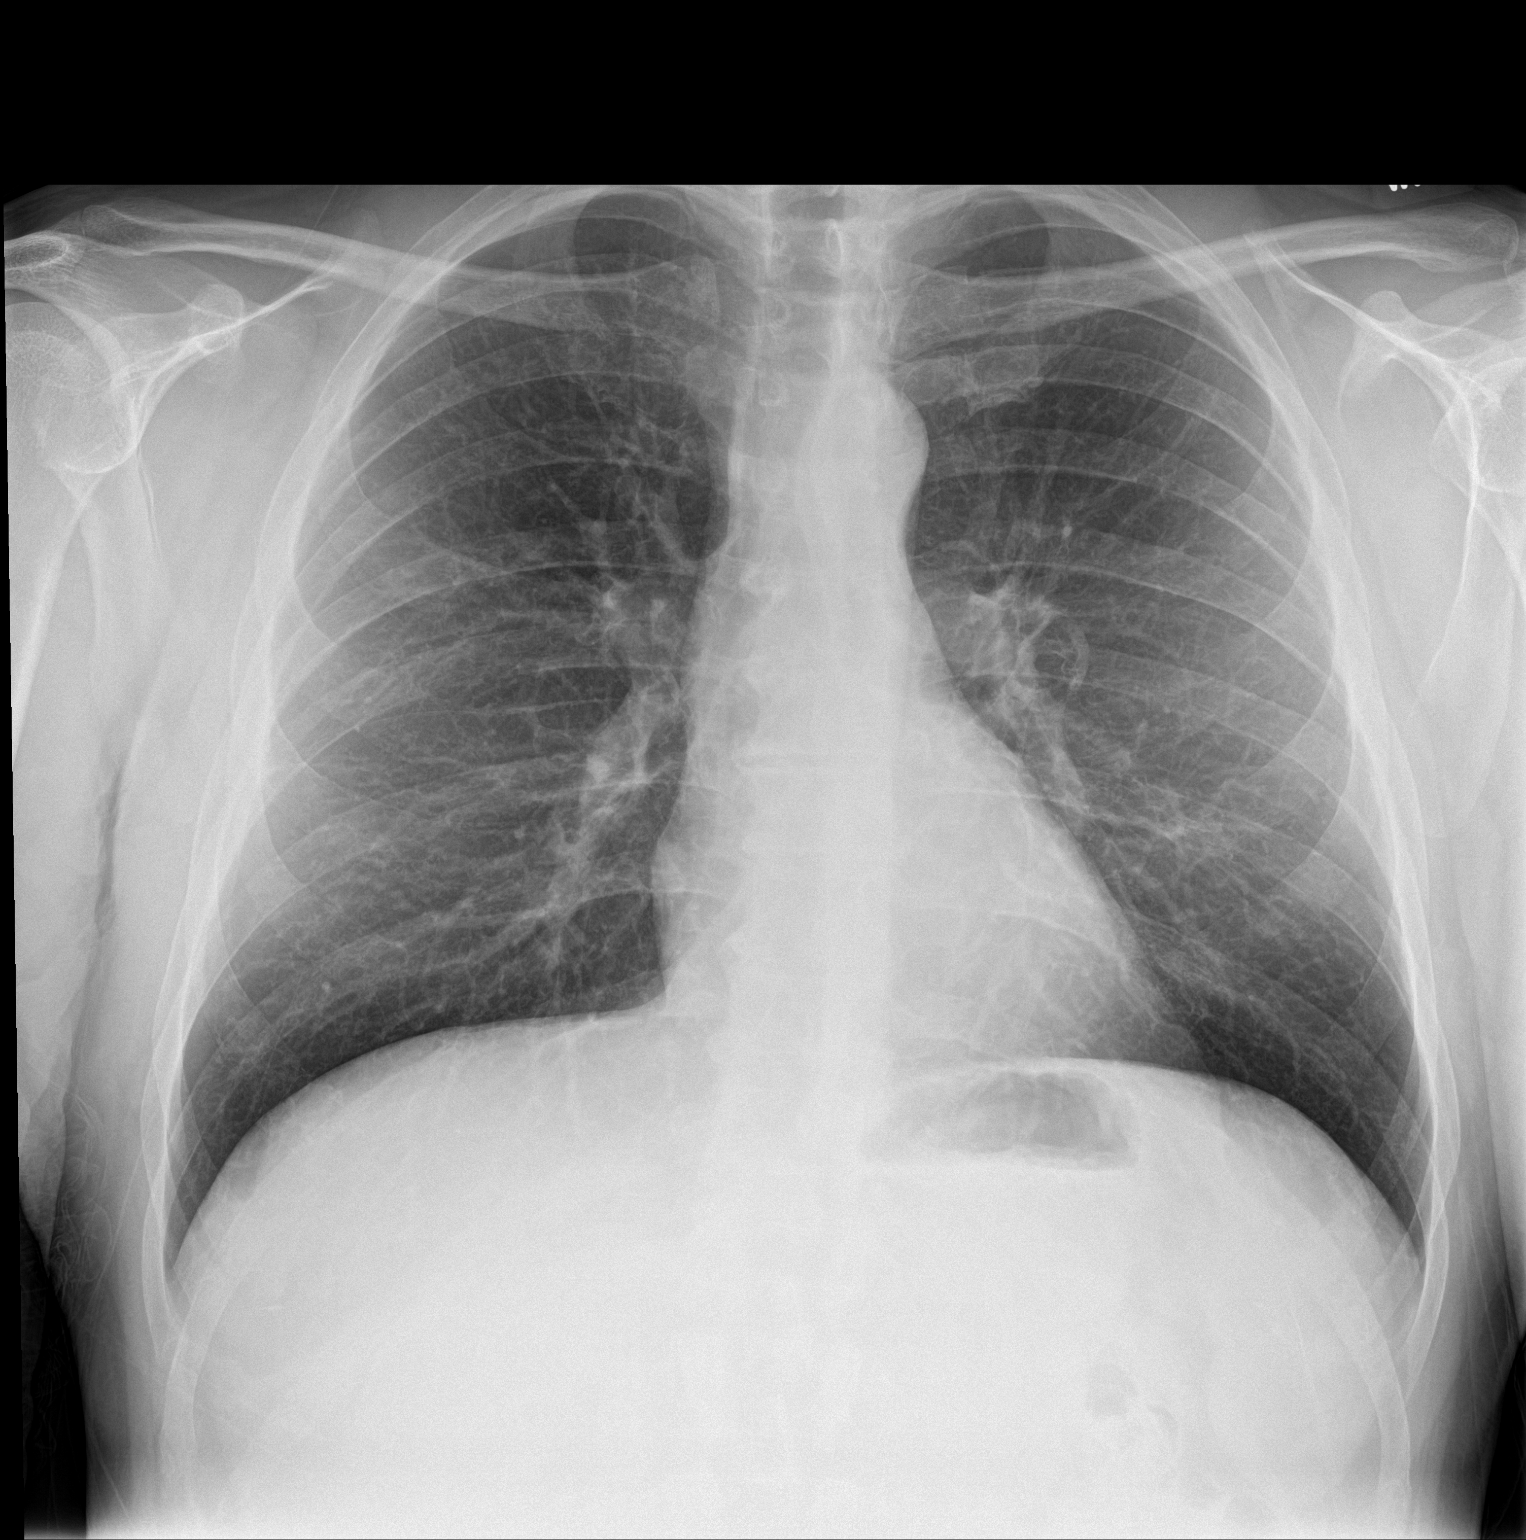

[chest lat]
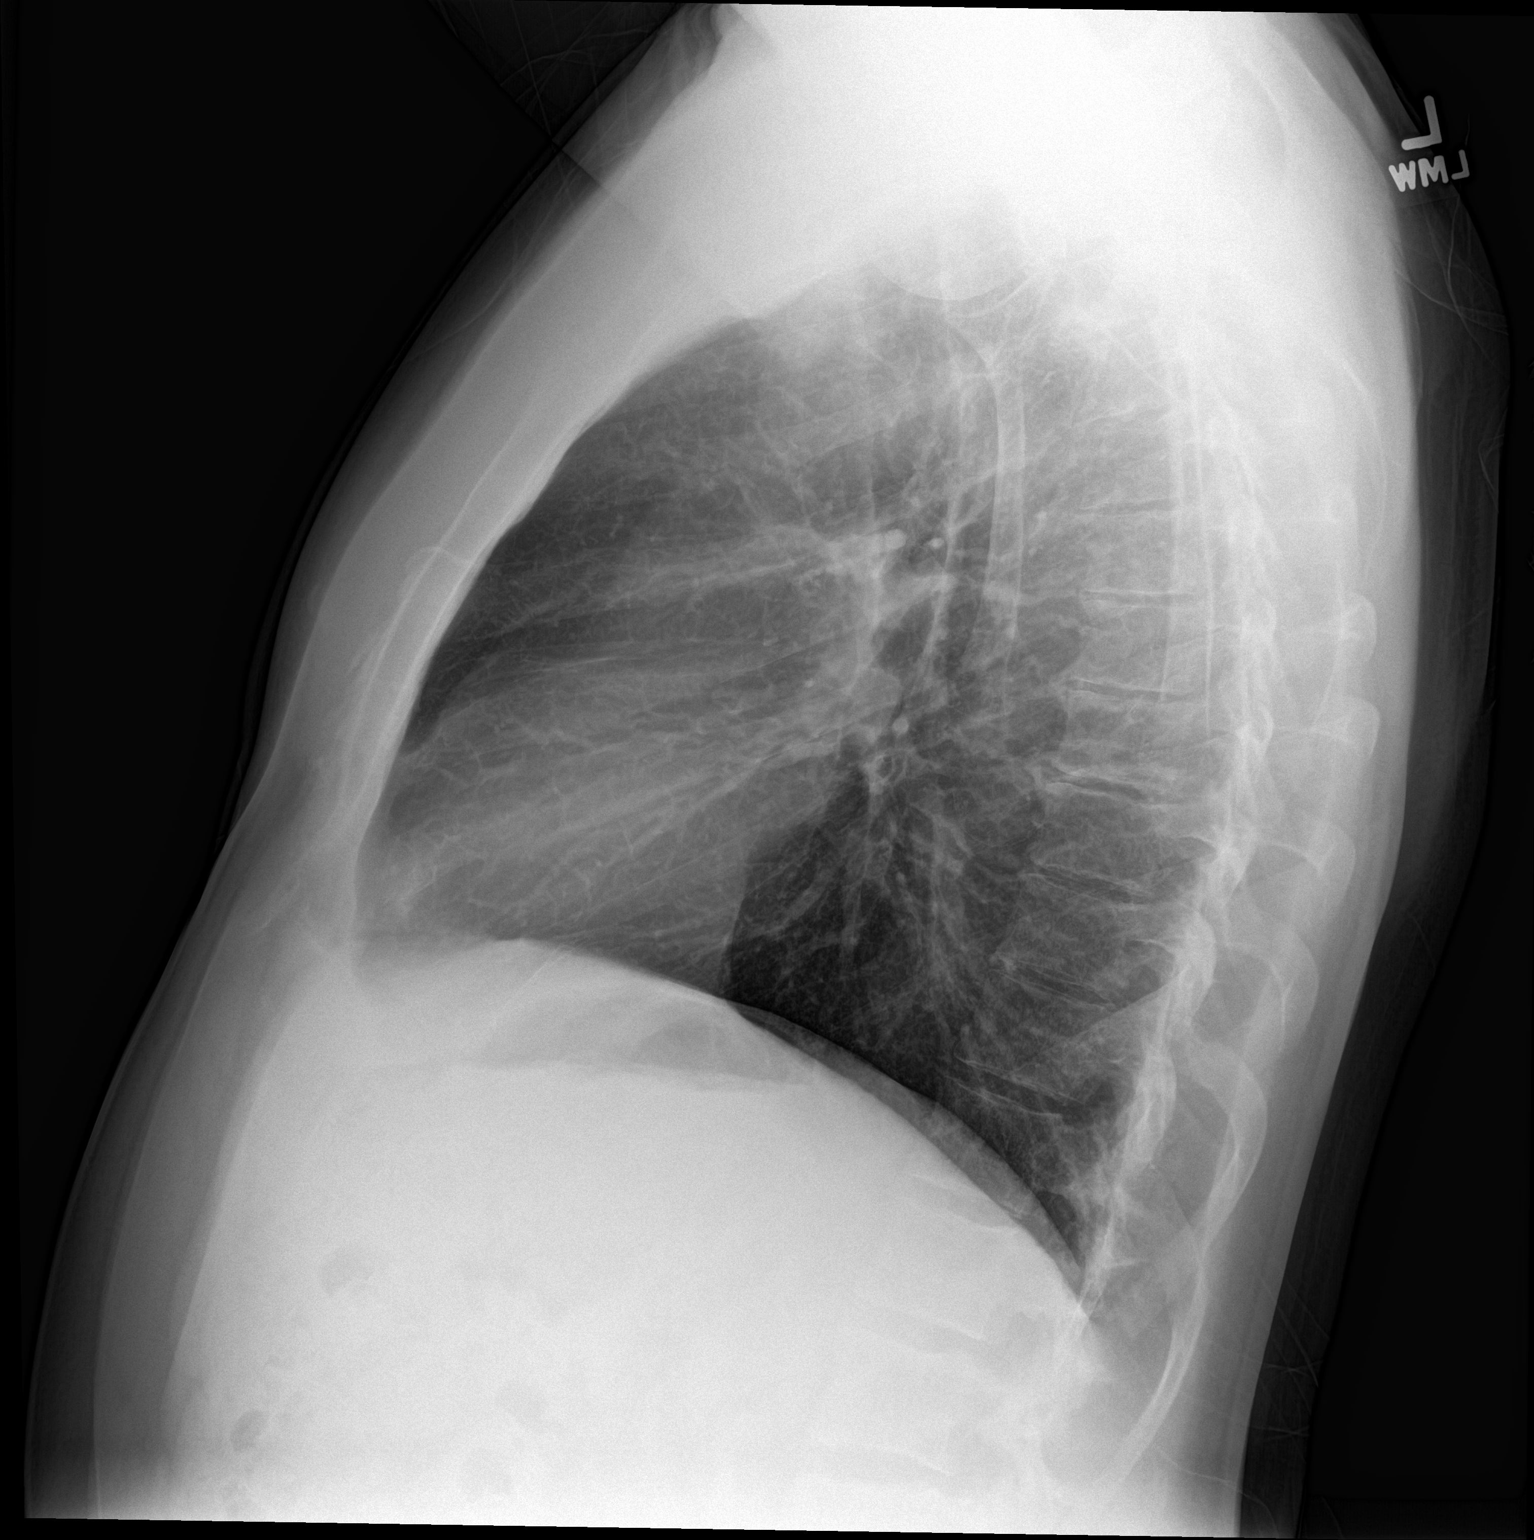

[2 of 2 positions shown; findings below may reference images not displayed]

FINDINGS: Normal heart size and pulmonary vascularity. No focal airspace
disease or consolidation in the lungs. No blunting of costophrenic
angles. No pneumothorax. Mediastinal contours appear intact.
Infiltration seen previously in the right lung has resolved. Mild
degenerative changes in the spine.
IMPRESSION: No active cardiopulmonary disease.

## 2016-06-14 IMAGING — US US RENAL
1 series · 14 of 25 positions shown · non-contrast
Comparison: 04/06/2016

CLINICAL DATA: Left ureteral stone

EXAM:
RENAL / URINARY TRACT ULTRASOUND COMPLETE

[Series 1: us renal · 0.25mm/px · 14 of 33 slices shown]
[im 1/33]
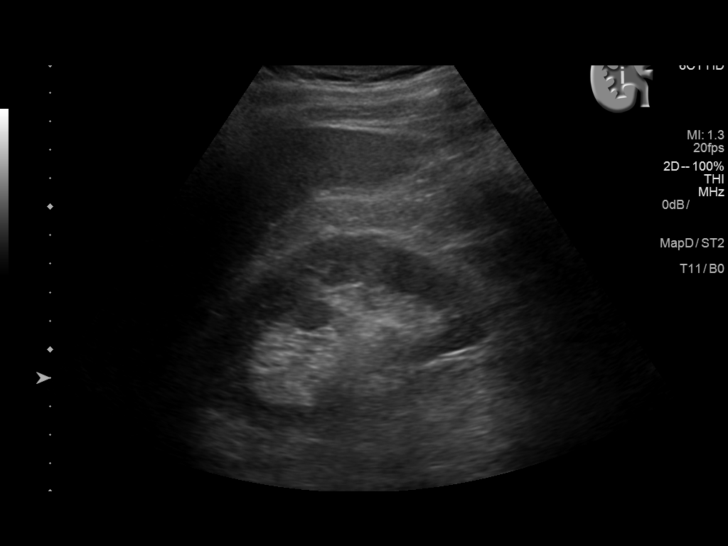
[im 3/33]
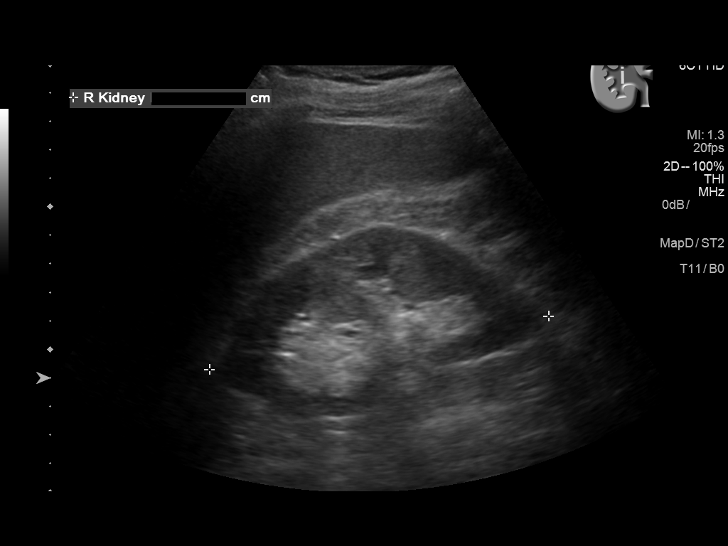
[im 6/33]
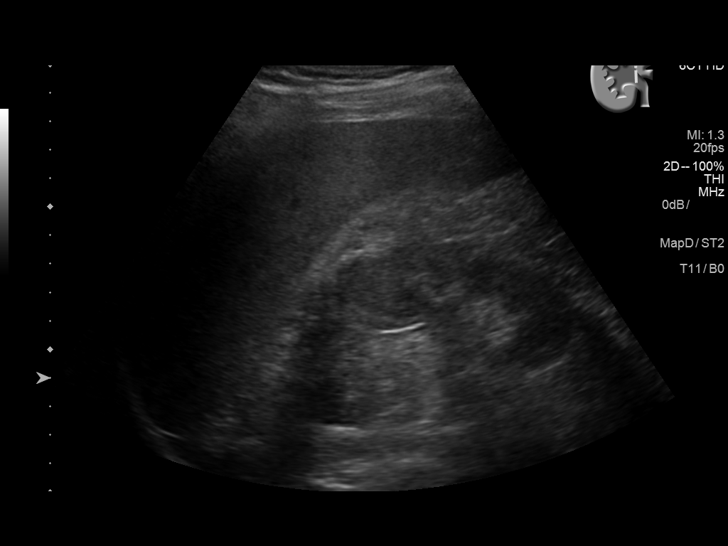
[im 9/33]
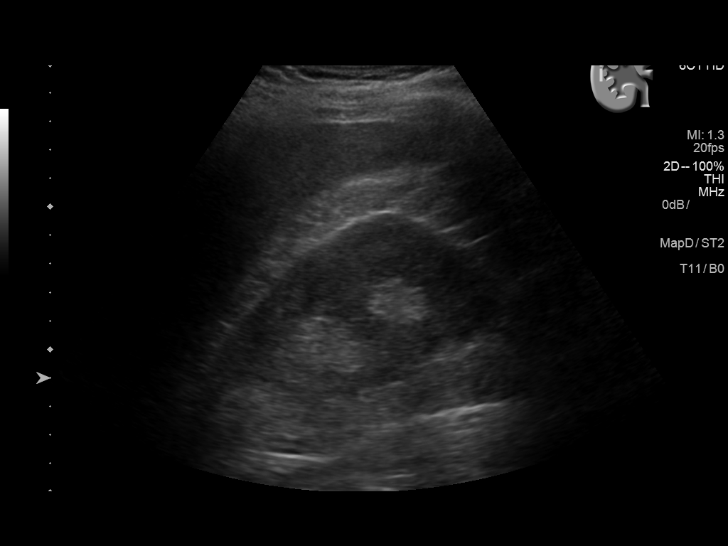
[im 11/33]
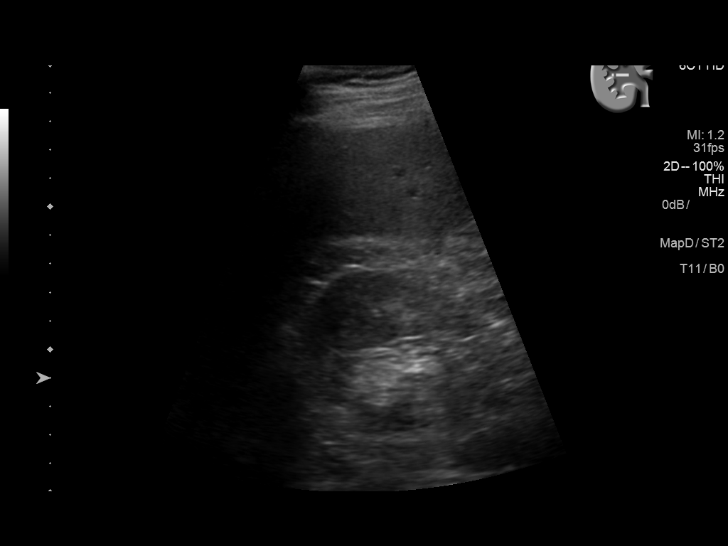
[im 13/33]
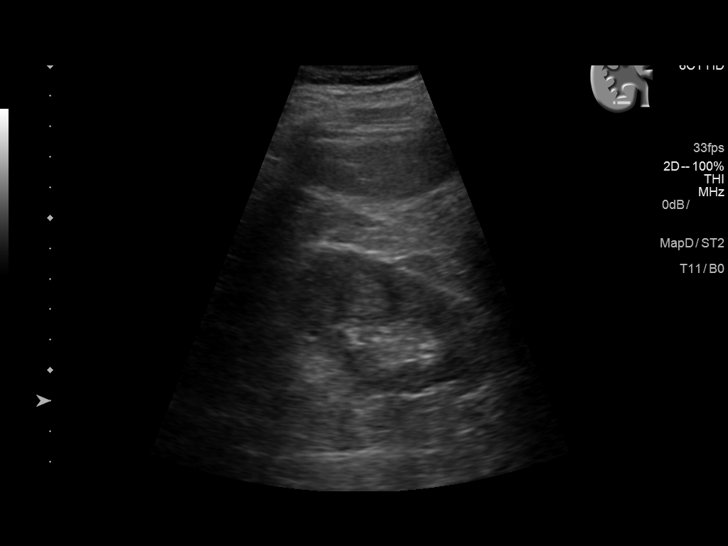
[im 15/33]
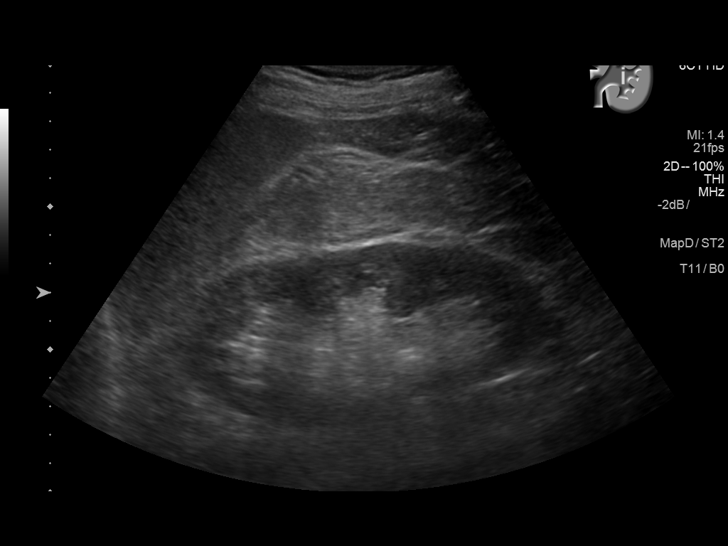
[im 18/33]
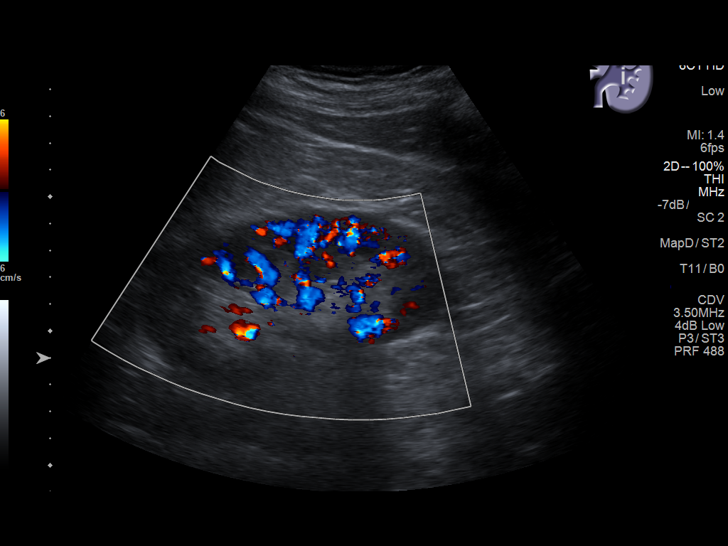
[im 21/33]
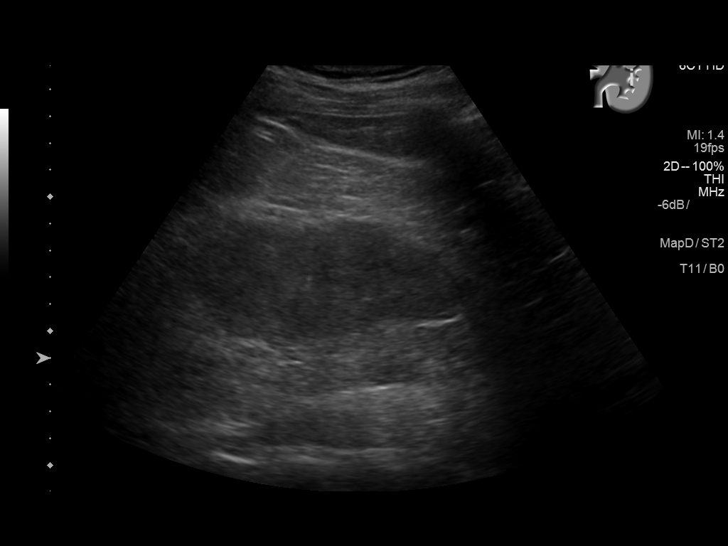
[im 22/33]
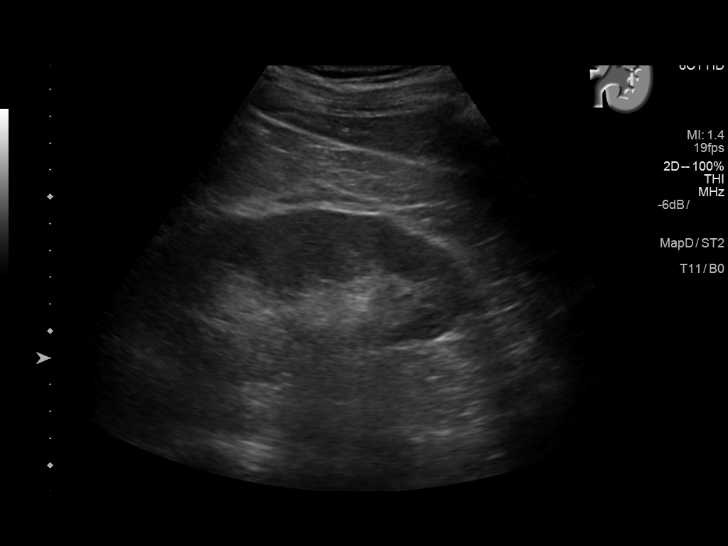
[im 25/33]
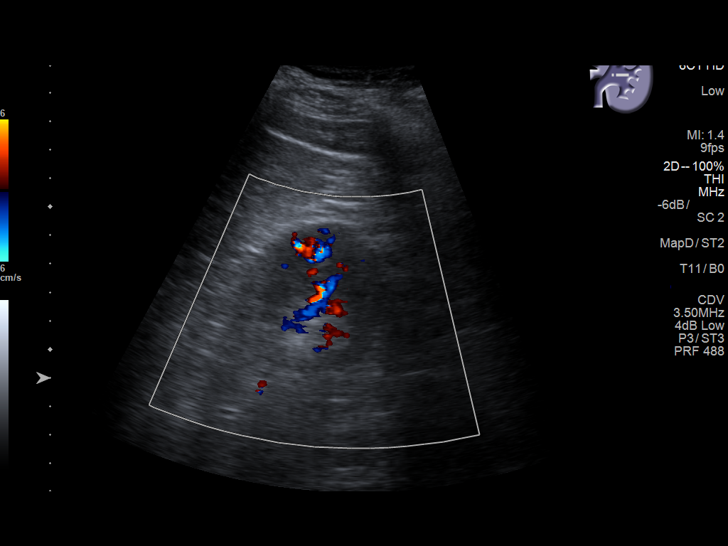
[im 27/33]
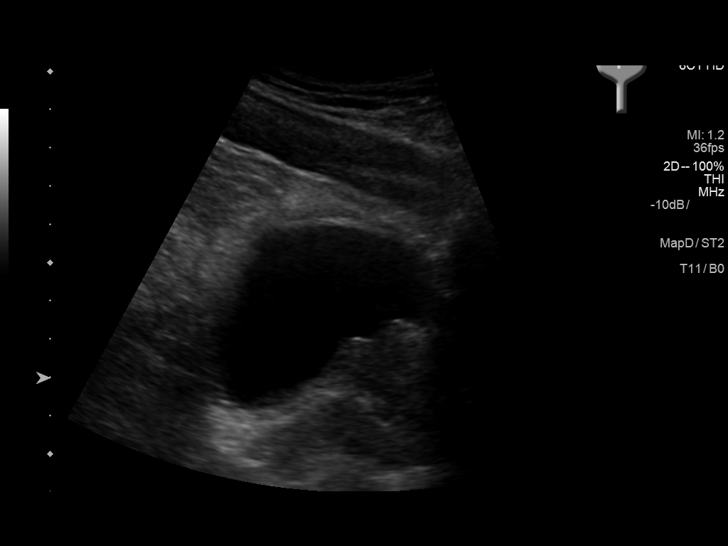
[im 30/33]
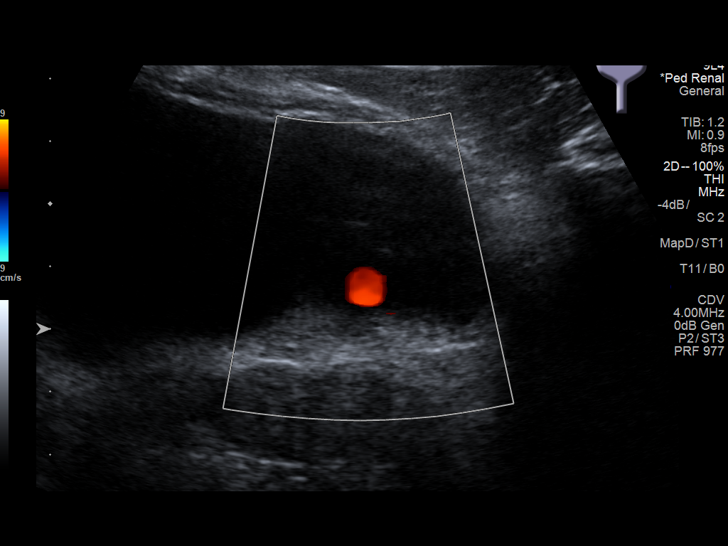
[im 33/33]
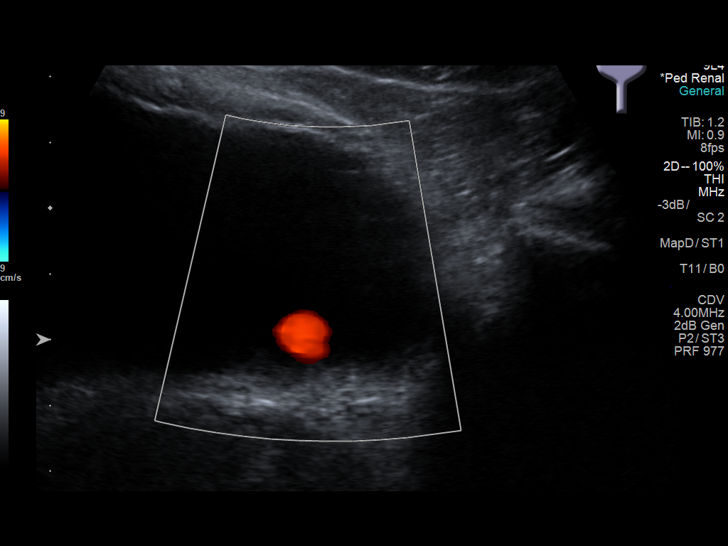

[14 of 25 positions shown; findings below may reference images not displayed]

FINDINGS: Right Kidney:

Length: 12 cm. Echogenicity within normal limits. No mass or
hydronephrosis visualized.

Left Kidney:

Length: 13.2 cm. Echogenicity within normal limits. No mass or
hydronephrosis visualized.

Bladder:

Appears normal for degree of bladder distention. Bilateral ureteral
jets are seen.
IMPRESSION: No obstructive changes are noted.

These results will be called to the ordering clinician or
representative by the [HOSPITAL] at the imaging location.

## 2016-06-30 DIAGNOSIS — J301 Allergic rhinitis due to pollen: Secondary | ICD-10-CM | POA: Diagnosis not present

## 2016-06-30 DIAGNOSIS — K219 Gastro-esophageal reflux disease without esophagitis: Secondary | ICD-10-CM | POA: Diagnosis not present

## 2016-06-30 DIAGNOSIS — T50995A Adverse effect of other drugs, medicaments and biological substances, initial encounter: Secondary | ICD-10-CM | POA: Diagnosis not present

## 2016-06-30 DIAGNOSIS — R05 Cough: Secondary | ICD-10-CM | POA: Diagnosis not present

## 2016-08-01 ENCOUNTER — Other Ambulatory Visit: Payer: Self-pay | Admitting: Family Medicine

## 2016-08-01 DIAGNOSIS — K120 Recurrent oral aphthae: Secondary | ICD-10-CM

## 2016-08-24 DIAGNOSIS — E782 Mixed hyperlipidemia: Secondary | ICD-10-CM | POA: Diagnosis not present

## 2016-08-24 DIAGNOSIS — I10 Essential (primary) hypertension: Secondary | ICD-10-CM | POA: Diagnosis not present

## 2016-09-19 ENCOUNTER — Other Ambulatory Visit: Payer: Self-pay | Admitting: Family Medicine

## 2016-09-19 DIAGNOSIS — K120 Recurrent oral aphthae: Secondary | ICD-10-CM

## 2016-10-28 IMAGING — CT CT RENAL STONE PROTOCOL
1 of 2 series · 15 of 32 positions shown, 19 images · non-contrast
Comparison: 03/21/2016

CLINICAL DATA: Left flank pain. History of kidney stones. Patient
was seen here 3 weeks ago and found to have 3 mm kidney stone.
Worsening left flank pain.

EXAM:
CT ABDOMEN AND PELVIS WITHOUT CONTRAST
TECHNIQUE: Multidetector CT imaging of the abdomen and pelvis was performed
following the standard protocol without IV contrast.

[Series 2: stone standard full · axial · 0.72mm/px · z∈[-1204,-784]mm · 15 of 92 slices shown, 19 images]
[im 4/92  soft-tissue]
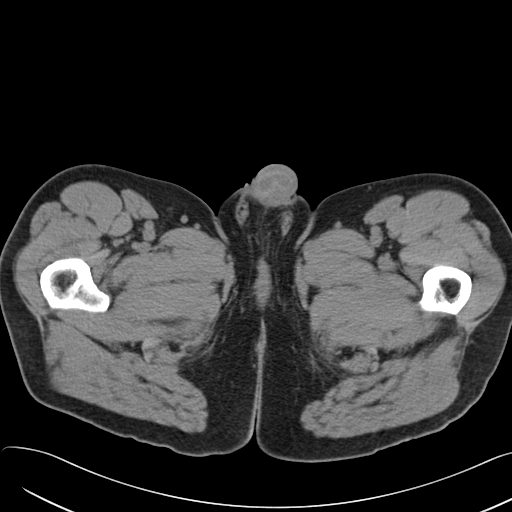
[im 4/92  bone]
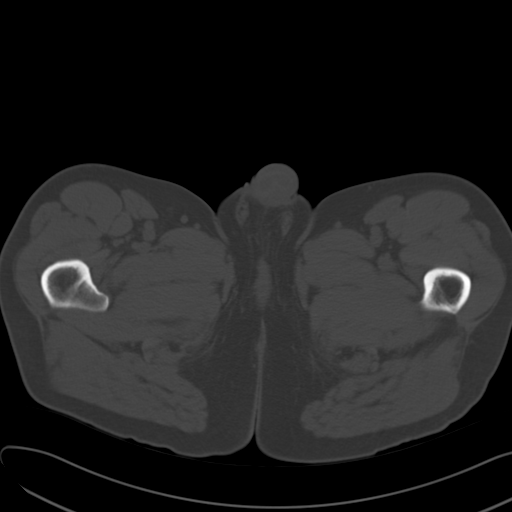
[im 12/92  soft-tissue]
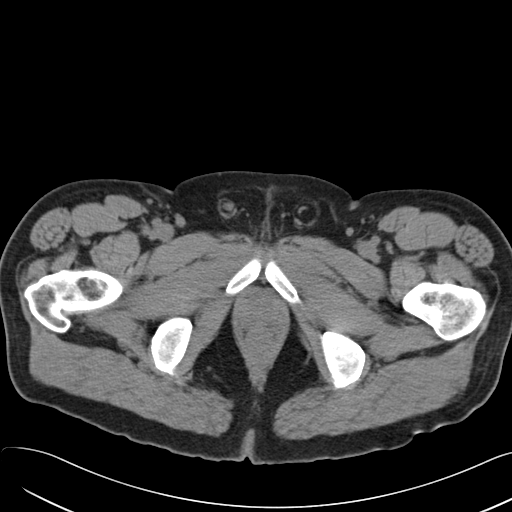
[im 19/92  soft-tissue]
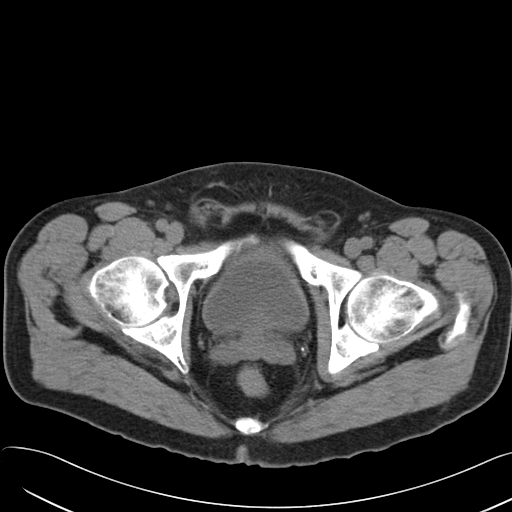
[im 27/92  soft-tissue]
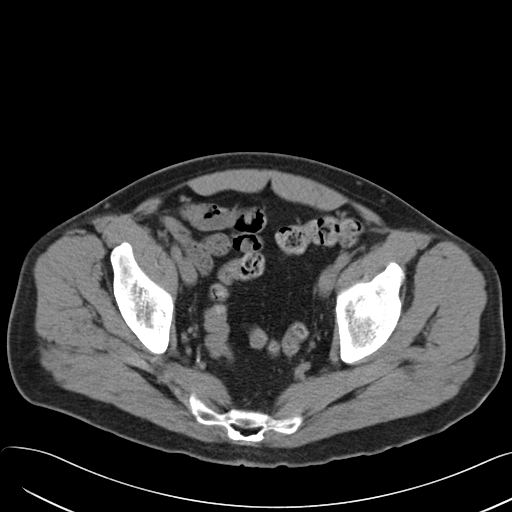
[im 31/92  soft-tissue]
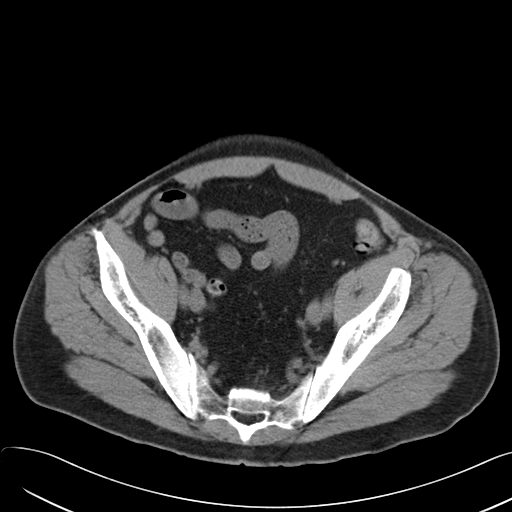
[im 38/92  soft-tissue]
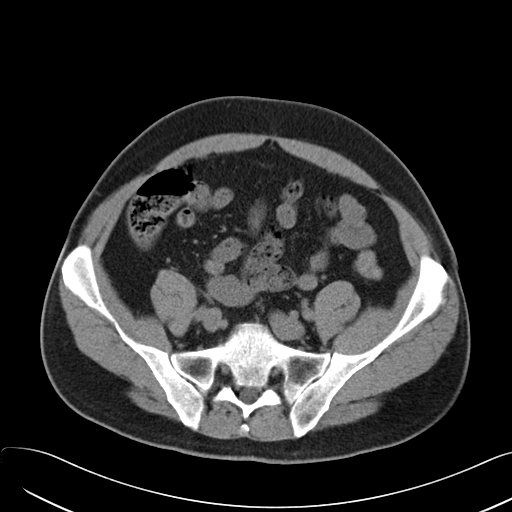
[im 46/92  soft-tissue]
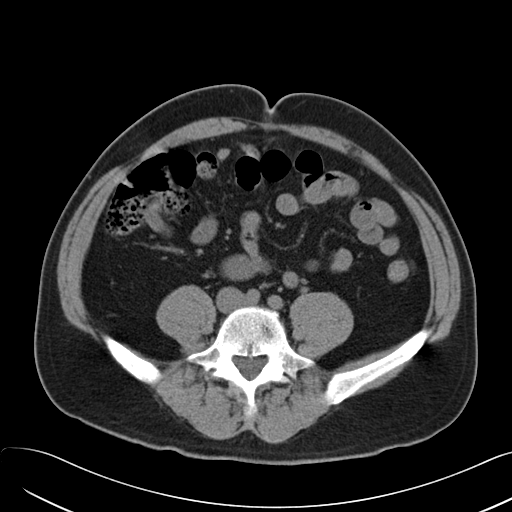
[im 54/92  soft-tissue]
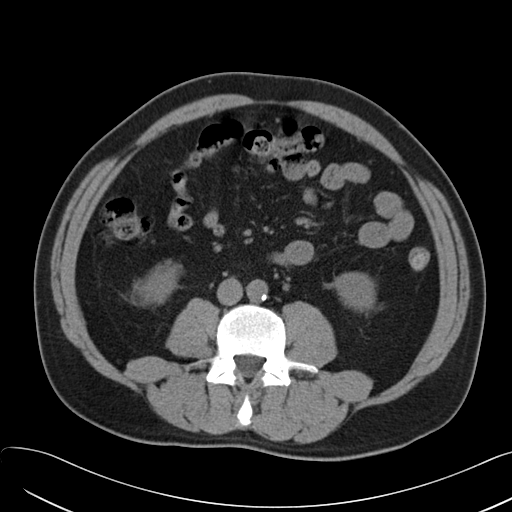
[im 61/92  soft-tissue]
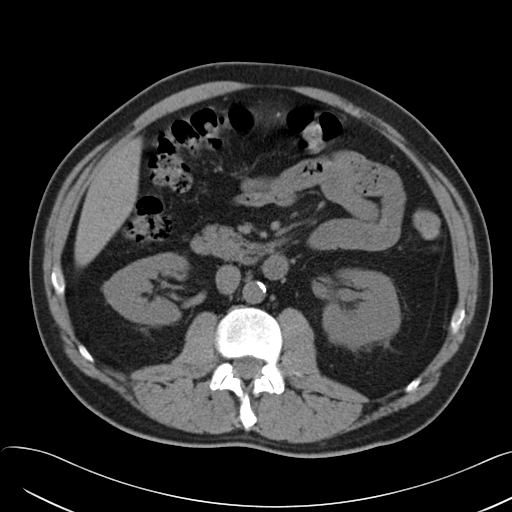
[im 61/92  bone]
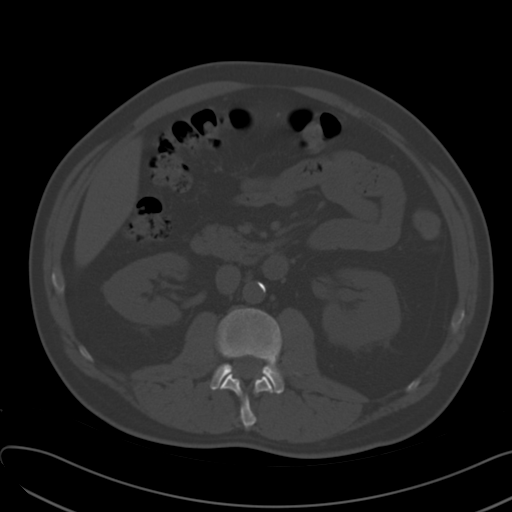
[im 65/92  soft-tissue]
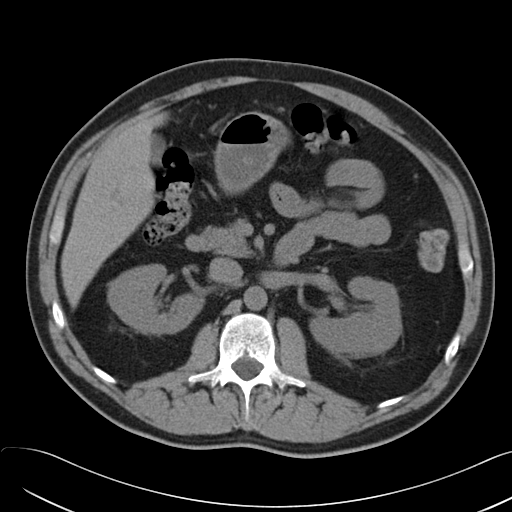
[im 73/92  soft-tissue]
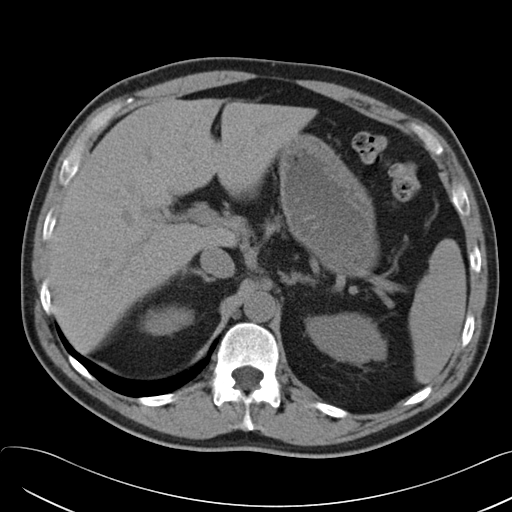
[im 76/92  lung]
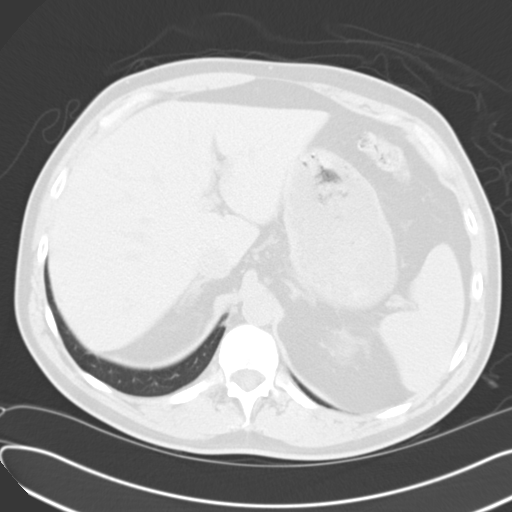
[im 80/92  soft-tissue]
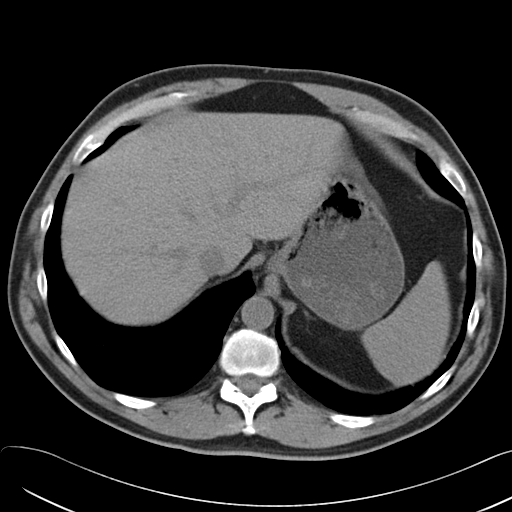
[im 80/92  lung]
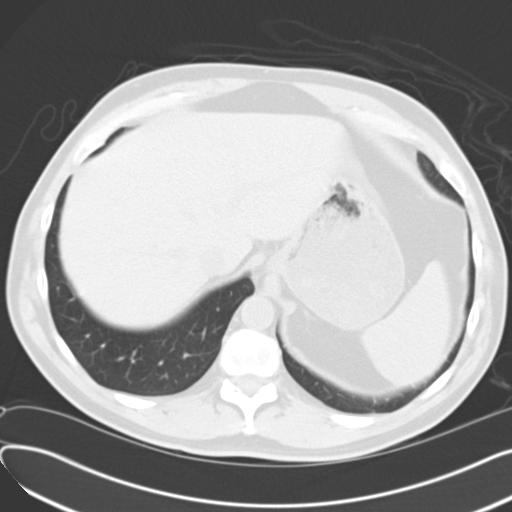
[im 84/92  lung]
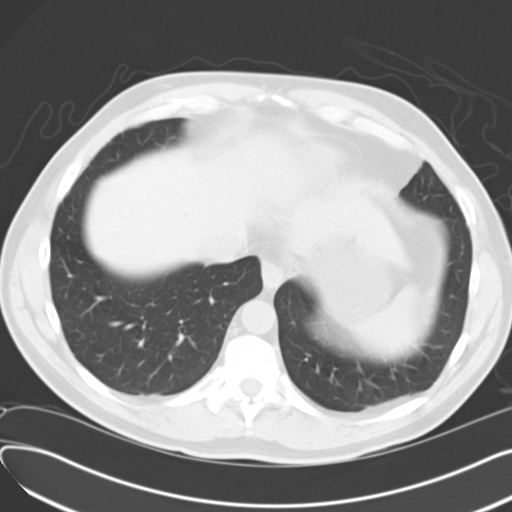
[im 88/92  soft-tissue]
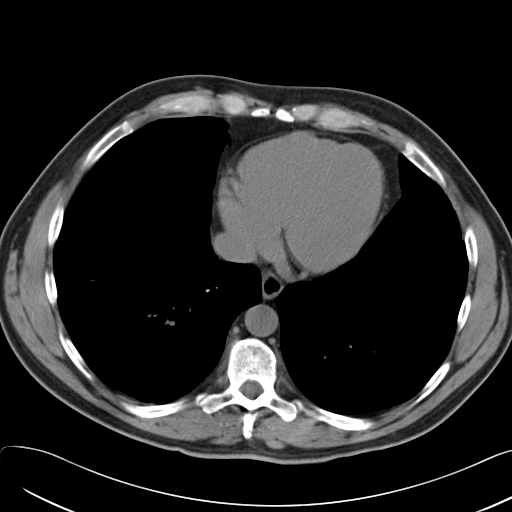
[im 88/92  lung]
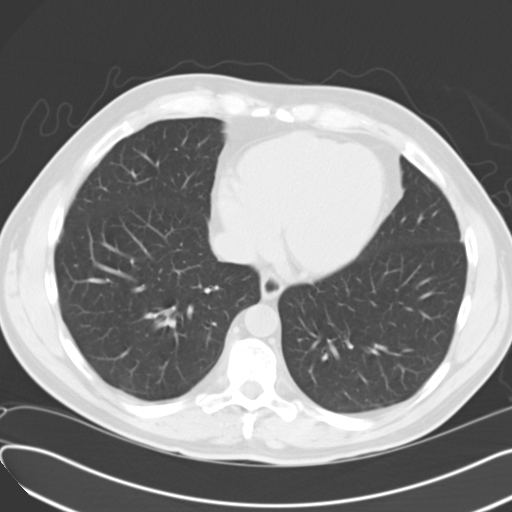

[15 of 32 positions shown; findings below may reference images not displayed]

FINDINGS: Lung bases are clear. 3 mm stone remains in the distal left ureter
at the ureterovesical junction. No significant change in position
since previous study. Mild dilatation of left ureter and collecting
system. Mild stranding around the left kidney in ureter. No new
stones identified. Right kidney and ureter are unremarkable. No
bladder wall thickening.

The unenhanced appearance of the liver, spleen, gallbladder,
pancreas, adrenal glands, abdominal aorta, inferior vena cava, and
retroperitoneal lymph nodes is unremarkable. Stomach, small bowel,
and colon are not abnormally distended. Stool fills the colon. No
free air or free fluid in the abdomen.

Pelvis: Prostate gland is enlarged, measuring 4.5 cm diameter. No
free or loculated pelvic fluid collections. No pelvic mass or
lymphadenopathy. No inflammatory infiltration. No destructive bone
lesions.
IMPRESSION: 3 mm stone in the distal left ureter without change in position
since previous study.

## 2016-11-01 ENCOUNTER — Encounter: Payer: Self-pay | Admitting: Family Medicine

## 2016-11-01 ENCOUNTER — Ambulatory Visit (INDEPENDENT_AMBULATORY_CARE_PROVIDER_SITE_OTHER): Payer: Federal, State, Local not specified - PPO | Admitting: Family Medicine

## 2016-11-01 VITALS — BP 122/80 | HR 68 | Temp 98.4°F | Resp 16 | Wt 183.0 lb

## 2016-11-01 DIAGNOSIS — I10 Essential (primary) hypertension: Secondary | ICD-10-CM

## 2016-11-01 DIAGNOSIS — E782 Mixed hyperlipidemia: Secondary | ICD-10-CM

## 2016-11-01 DIAGNOSIS — Z125 Encounter for screening for malignant neoplasm of prostate: Secondary | ICD-10-CM

## 2016-11-01 DIAGNOSIS — K120 Recurrent oral aphthae: Secondary | ICD-10-CM

## 2016-11-01 DIAGNOSIS — Z8042 Family history of malignant neoplasm of prostate: Secondary | ICD-10-CM

## 2016-11-01 MED ORDER — VALACYCLOVIR HCL 1 G PO TABS: ORAL_TABLET | ORAL | 3 refills | Status: DC

## 2016-11-01 NOTE — Progress Notes (Signed)
Subjective:     Patient ID: Terry Franco, male   DOB: July 05, 1961, 55 y.o.   MRN: [id number]  HPI  Chief Complaint  Patient presents with  . Labs Only    Pt requesting a PSA be checked today. Was seeing urology for stones this past summer, who advised pt to have PSA checked. Pt also requesting other labs to be checked.  Marland Kitchen Headache    Happening more frequently. Is taking Tylenol for H/A. Believes this is secondary to sinus problem.  . Mouth Lesions    Pt concerned because he is having  to take on a daily basis.  States he is taking Valtrex twice daily frequently with a very brief hiatus between outbreaks. Concerned he is taking too much. We have presumed he is having recurrent herpetic ulcers. For his weekly headaches suggested he resume steroid nasal spray and consider that they will abate if allergy related as we have had the first freeze of the season. He has seen his cardiologist recently with annual return recommended.   Review of Systems     Objective:   Physical Exam  Constitutional: He appears well-developed and well-nourished. Distressed: anxious about his health.  HENT:  Left lower gum with aphthous appearing ulcer. Patient also feels one at base of his tongue but I am unable to visualize.       Assessment:    1. Mixed hyperlipidemia - Lipid panel  2. Family history of prostate cancer  3. Essential hypertension - Comprehensive metabolic panel  4. Screening for prostate cancer - PSA  5. Herpetiform aphthous ulceration - valACYclovir (VALTREX) 1000 MG tablet; One tablet daily for suppression for oral ulcers  Dispense: 90 tablet; Refill: 3 - Ambulatory referral to ENT    Plan:    Further f/u pending lab work.

## 2016-11-01 NOTE — Patient Instructions (Signed)
We will call you with the lab work and about the referral to ENT.

## 2016-11-02 DIAGNOSIS — E782 Mixed hyperlipidemia: Secondary | ICD-10-CM | POA: Diagnosis not present

## 2016-11-02 DIAGNOSIS — I10 Essential (primary) hypertension: Secondary | ICD-10-CM | POA: Diagnosis not present

## 2016-11-02 DIAGNOSIS — Z125 Encounter for screening for malignant neoplasm of prostate: Secondary | ICD-10-CM | POA: Diagnosis not present

## 2016-11-03 ENCOUNTER — Encounter: Payer: Self-pay | Admitting: Family Medicine

## 2016-11-03 ENCOUNTER — Telehealth: Payer: Self-pay

## 2016-11-03 DIAGNOSIS — B009 Herpesviral infection, unspecified: Secondary | ICD-10-CM | POA: Insufficient documentation

## 2016-11-03 LAB — COMPREHENSIVE METABOLIC PANEL
A/G RATIO: 1.9 (ref 1.2–2.2)
ALBUMIN: 4.4 g/dL (ref 3.5–5.5)
ALT: 59 IU/L — ABNORMAL HIGH (ref 0–44)
AST: 35 IU/L (ref 0–40)
Alkaline Phosphatase: 54 IU/L (ref 39–117)
BILIRUBIN TOTAL: 0.6 mg/dL (ref 0.0–1.2)
BUN/Creatinine Ratio: 14 (ref 9–20)
BUN: 14 mg/dL (ref 6–24)
CALCIUM: 9.3 mg/dL (ref 8.7–10.2)
CHLORIDE: 101 mmol/L (ref 96–106)
CO2: 28 mmol/L (ref 18–29)
Creatinine, Ser: 1.01 mg/dL (ref 0.76–1.27)
GFR, EST AFRICAN AMERICAN: 96 mL/min/{1.73_m2} (ref >59)
GFR, EST NON AFRICAN AMERICAN: 83 mL/min/{1.73_m2} (ref >59)
GLOBULIN, TOTAL: 2.3 g/dL (ref 1.5–4.5)
Glucose: 105 mg/dL — ABNORMAL HIGH (ref 65–99)
POTASSIUM: 4.3 mmol/L (ref 3.5–5.2)
SODIUM: 143 mmol/L (ref 134–144)
TOTAL PROTEIN: 6.7 g/dL (ref 6.0–8.5)

## 2016-11-03 LAB — PSA: PROSTATE SPECIFIC AG, SERUM: 1.2 ng/mL (ref 0.0–4.0)

## 2016-11-03 LAB — LIPID PANEL
CHOL/HDL RATIO: 4.9 ratio (ref 0.0–5.0)
Cholesterol, Total: 180 mg/dL (ref 100–199)
HDL: 37 mg/dL — AB (ref >39)
LDL Calculated: 98 mg/dL (ref 0–99)
Triglycerides: 224 mg/dL — ABNORMAL HIGH (ref 0–149)
VLDL Cholesterol Cal: 45 mg/dL — ABNORMAL HIGH (ref 5–40)

## 2016-11-03 NOTE — Telephone Encounter (Signed)
Patient has been advised. KW 

## 2016-11-03 NOTE — Telephone Encounter (Signed)
-----   Message from [name], [place] sent at 11/03/2016  7:48 AM EST ----- PSA is good, cholesterol under control with medication. Minimal elevation of sugar-continue regular exercise. Follow up with ENT as scheduled.

## 2016-11-17 IMAGING — CR DG ABDOMEN 1V
1 series · 1 of 1 positions shown · non-contrast
Comparison: 04/06/2016

CLINICAL DATA: Left ureteral stone.

EXAM:
ABDOMEN - 1 VIEW

[dg abd 1 view]
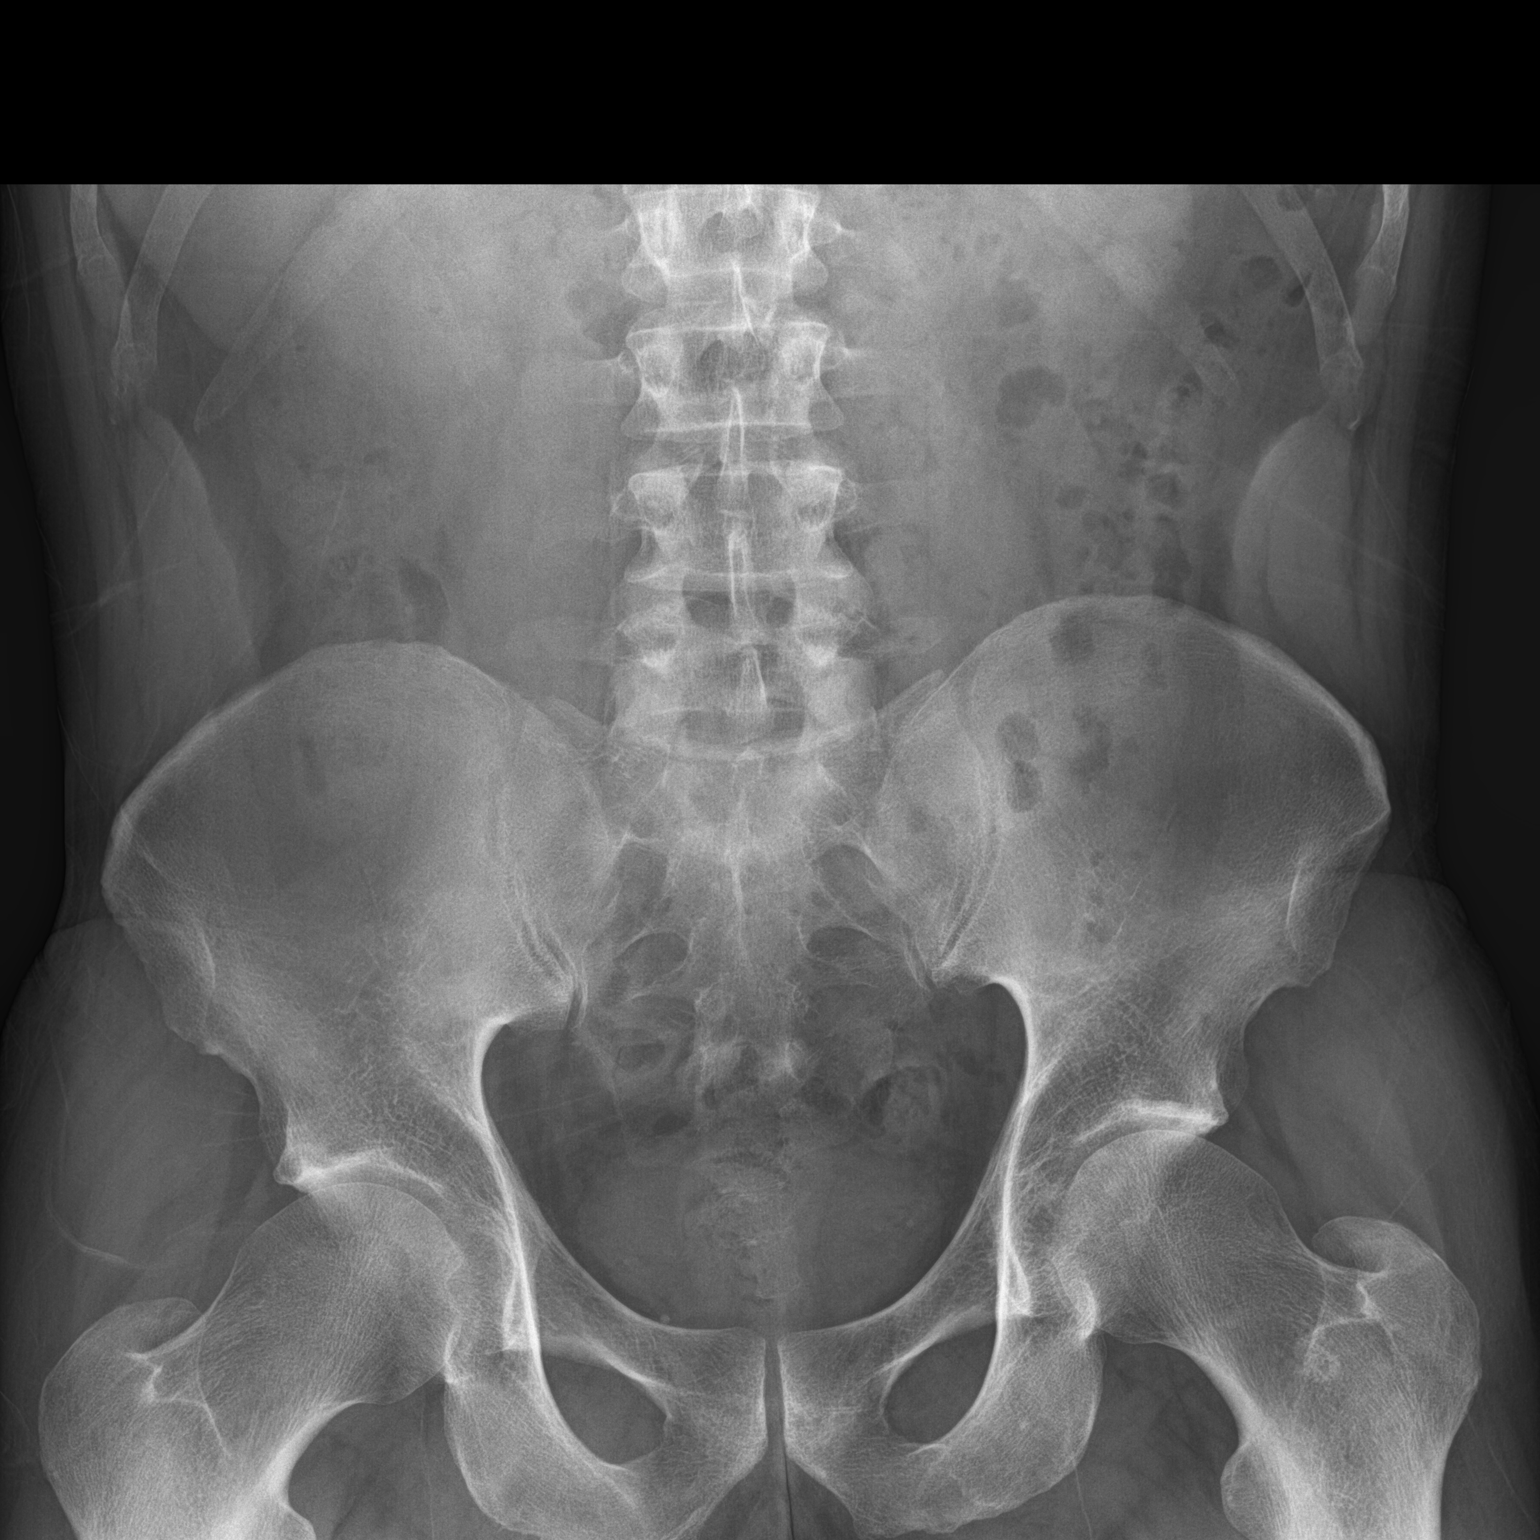

[1 of 1 positions shown; findings below may reference images not displayed]

FINDINGS: Vague calcification projects to the left of midline in the lower
pelvis. This could reflect the previously seen small distal left
ureteral stone. This is difficult to confirm without cross-sectional
imaging as there also is a small phlebolith in the left pelvis on
prior CT. Normal bowel gas pattern. No free air organomegaly. No
acute bony abnormality.
IMPRESSION: Small calcification in the left lower pelvis could reflect the
previously seen distal left ureteral stone. This could be confirmed
with CT urogram if felt clinically indicated.

## 2016-11-27 ENCOUNTER — Other Ambulatory Visit: Payer: Self-pay | Admitting: Family Medicine

## 2016-11-27 DIAGNOSIS — E785 Hyperlipidemia, unspecified: Secondary | ICD-10-CM

## 2016-11-29 ENCOUNTER — Ambulatory Visit (INDEPENDENT_AMBULATORY_CARE_PROVIDER_SITE_OTHER): Payer: Federal, State, Local not specified - PPO | Admitting: Physician Assistant

## 2016-11-29 ENCOUNTER — Encounter: Payer: Self-pay | Admitting: Physician Assistant

## 2016-11-29 VITALS — BP 136/88 | HR 68 | Temp 97.5°F | Resp 16 | Wt 186.0 lb

## 2016-11-29 DIAGNOSIS — H6121 Impacted cerumen, right ear: Secondary | ICD-10-CM | POA: Diagnosis not present

## 2016-11-29 NOTE — Patient Instructions (Signed)
Earwax Buildup Your ears make a substance called earwax. It may also be called cerumen. Sometimes, too much earwax builds up in your ear canal. This can cause ear pain and make it harder for you to hear. CAUSES This condition is caused by too much earwax production or buildup. RISK FACTORS The following factors may make you more likely to develop this condition:  Cleaning your ears often with swabs.  Having narrow ear canals.  Having earwax that is overly thick or sticky.  Having eczema.  Being dehydrated. SYMPTOMS Symptoms of this condition include:  Reduced hearing.  Ear drainage.  Ear pain.  Ear itch.  A feeling of fullness in the ear or feeling that the ear is plugged.  Ringing in the ear.  Coughing. DIAGNOSIS Your health care provider can diagnose this condition based on your symptoms and medical history. Your health care provider will also do an ear exam to look inside your ear with a scope (otoscope). You may also have a hearing test. TREATMENT Treatment for this condition includes:  Over-the-counter or prescription ear drops to soften the earwax.  Earwax removal by a health care provider. This may be done:  By flushing the ear with body-temperature water.  With a medical instrument that has a loop at the end (earwax curette).  With a suction device. HOME CARE INSTRUCTIONS  Take over-the-counter and prescription medicines only as told by your health care provider.  Do not put any objects, including an ear swab, into your ear. You can clean the opening of your ear canal with a washcloth.  Drink enough water to keep your urine clear or pale yellow.  If you have frequent earwax buildup or you use hearing aids, consider seeing your health care provider every 6-12 months for routine preventive ear cleanings. Keep all follow-up visits as told by your health care provider. SEEK MEDICAL CARE IF:  You have ear pain.  Your condition does not improve with  treatment.  You have hearing loss.  You have blood, pus, or other fluid coming from your ear. This information is not intended to replace advice given to you by your health care provider. Make sure you discuss any questions you have with your health care provider. Document Released: 01/11/2005 Document Revised: 03/27/2016 Document Reviewed: 07/21/2015 Elsevier Interactive Patient Education  2017 [employer]  

## 2016-11-29 NOTE — Progress Notes (Signed)
Patient: Terry Franco Male    DOB: [birth year]   55 y.o.   MRN: [id number] Visit Date: 11/29/2016  Today's Provider: Trinna Post, PA-C   Chief Complaint  Patient presents with  . Ear Fullness    Right ear   Subjective:    Ear Fullness   There is pain in the right ear. This is a new problem. The current episode started yesterday. The problem occurs constantly. The problem has been unchanged. There has been no fever. The patient is experiencing no pain. Associated symptoms include hearing loss. Pertinent negatives include no ear discharge, headaches, rhinorrhea or sore throat. He has tried ear drops for the symptoms. The treatment provided no relief.   Patient is 55 y/o male presenting today with right ear fullness after trying to remove wax from ear. No pain, discharge, fever, chills prior to this. Felt like he had some wax in his right ear, put some debrox in it and then rinsed it out with water and felt like he clogged it more. Now having difficulty hearing. Left ear unaffected.     Allergies  Allergen Reactions  . Ibuprofen Swelling  . Versed [Midazolam] Nausea And Vomiting  . Penicillin V Potassium Rash and Other (See Comments)    Has patient had a PCN reaction causing immediate rash, facial/tongue/throat swelling, SOB or lightheadedness with hypotension: unknown Has patient had a PCN reaction causing severe rash involving mucus membranes or skin necrosis: unknown Has patient had a PCN reaction that required hospitalization No Has patient had a PCN reaction occurring within the last 10 years: No If all of the above answers are "NO", then may proceed with Cephalosporin use.      Current Outpatient Prescriptions:  .  fluticasone (FLONASE SENSIMIST) 27.5 MCG/SPRAY nasal spray, Place 2 sprays into the nose daily., Disp: , Rfl:  .  losartan-hydrochlorothiazide (HYZAAR) 100-25 MG per tablet, Take 1 tablet by mouth daily. , Disp: , Rfl:  .  meloxicam (MOBIC) 15 MG  tablet, Take 7.5 mg by mouth 2 (two) times daily. , Disp: , Rfl:  .  montelukast (SINGULAIR) 10 MG tablet, Take 10 mg by mouth daily., Disp: , Rfl: 0 .  pravastatin (PRAVACHOL) 80 MG tablet, take 1 tablet by mouth once daily, Disp: 90 tablet, Rfl: 3 .  PROAIR RESPICLICK 123XX123 (90 Base) MCG/ACT AEPB, Inhale 2 puffs into the lungs every 4 (four) hours as needed (shortness of breath). , Disp: , Rfl: 0 .  tiZANidine (ZANAFLEX) 4 MG tablet, Take 4 mg by mouth 3 (three) times daily. , Disp: , Rfl: 0 .  valACYclovir (VALTREX) 1000 MG tablet, One tablet daily for suppression for oral ulcers, Disp: 90 tablet, Rfl: 3  Review of Systems  Constitutional: Negative.   HENT: Positive for congestion, hearing loss and tinnitus. Negative for ear discharge, ear pain, postnasal drip, rhinorrhea, sinus pain, sinus pressure and sore throat.   Allergic/Immunologic: Positive for environmental allergies.  Neurological: Negative for dizziness, light-headedness and headaches.    Social History  Substance Use Topics  . Smoking status: Never Smoker  . Smokeless tobacco: Not on file  . Alcohol use 0.0 oz/week   Objective:   BP 136/88 (BP Location: Left Arm, Patient Position: Sitting, Cuff Size: Normal)   Pulse 68   Temp 97.5 F (36.4 C) (Oral)   Resp 16   Wt 186 lb (84.4 kg)   BMI 27.47 kg/m   Physical Exam  Constitutional: He appears well-developed and well-nourished.  HENT:  Right Ear: External ear normal.  Left Ear: Tympanic membrane and external ear normal.  Right TM obscured by large amount of soft looking wax.   Skin: Skin is warm and dry.        Assessment & Plan:      Problem List Items Addressed This Visit    None    Visit Diagnoses    Excessive ear wax, right    -  Primary     Patient is 55 y/o male presenting with right ear obstruction 2/2 ear wax. Lavaged in office with improvement in hearing. May continue to use debrox to keep wax soft. May use swimmers ear drops OTC (isopropyl  alcohol) to dry up remaining water.  Return if symptoms worsen or fail to improve.  The entirety of the information documented in the History of Present Illness, Review of Systems and Physical Exam were personally obtained by me. Portions of this information were initially documented by Ashley Royalty, CMA and reviewed by me for thoroughness and accuracy.   Patient Instructions  Earwax Buildup Your ears make a substance called earwax. It may also be called cerumen. Sometimes, too much earwax builds up in your ear canal. This can cause ear pain and make it harder for you to hear. CAUSES This condition is caused by too much earwax production or buildup. RISK FACTORS The following factors may make you more likely to develop this condition:  Cleaning your ears often with swabs.  Having narrow ear canals.  Having earwax that is overly thick or sticky.  Having eczema.  Being dehydrated. SYMPTOMS Symptoms of this condition include:  Reduced hearing.  Ear drainage.  Ear pain.  Ear itch.  A feeling of fullness in the ear or feeling that the ear is plugged.  Ringing in the ear.  Coughing. DIAGNOSIS Your health care provider can diagnose this condition based on your symptoms and medical history. Your health care provider will also do an ear exam to look inside your ear with a scope (otoscope). You may also have a hearing test. TREATMENT Treatment for this condition includes:  Over-the-counter or prescription ear drops to soften the earwax.  Earwax removal by a health care provider. This may be done:  By flushing the ear with body-temperature water.  With a medical instrument that has a loop at the end (earwax curette).  With a suction device. HOME CARE INSTRUCTIONS  Take over-the-counter and prescription medicines only as told by your health care provider.  Do not put any objects, including an ear swab, into your ear. You can clean the opening of your ear canal with a  washcloth.  Drink enough water to keep your urine clear or pale yellow.  If you have frequent earwax buildup or you use hearing aids, consider seeing your health care provider every 6-12 months for routine preventive ear cleanings. Keep all follow-up visits as told by your health care provider. SEEK MEDICAL CARE IF:  You have ear pain.  Your condition does not improve with treatment.  You have hearing loss.  You have blood, pus, or other fluid coming from your ear. This information is not intended to replace advice given to you by your health care provider. Make sure you discuss any questions you have with your health care provider. Document Released: 01/11/2005 Document Revised: 03/27/2016 Document Reviewed: 07/21/2015 Elsevier Interactive Patient Education  2017 [facility]

## 2017-06-25 ENCOUNTER — Encounter: Payer: Self-pay | Admitting: Family Medicine

## 2017-06-25 ENCOUNTER — Ambulatory Visit (INDEPENDENT_AMBULATORY_CARE_PROVIDER_SITE_OTHER): Payer: Federal, State, Local not specified - PPO | Admitting: Family Medicine

## 2017-06-25 VITALS — BP 132/88 | HR 72 | Temp 98.1°F | Resp 16 | Wt 183.0 lb

## 2017-06-25 DIAGNOSIS — S86811A Strain of other muscle(s) and tendon(s) at lower leg level, right leg, initial encounter: Secondary | ICD-10-CM | POA: Diagnosis not present

## 2017-06-25 DIAGNOSIS — I10 Essential (primary) hypertension: Secondary | ICD-10-CM | POA: Diagnosis not present

## 2017-06-25 DIAGNOSIS — E782 Mixed hyperlipidemia: Secondary | ICD-10-CM | POA: Diagnosis not present

## 2017-06-25 NOTE — Patient Instructions (Signed)
Continuing icing of calf and cross train to minimize further injury. May add Tylenol up to 3000 mg/day for back and calf pain.

## 2017-06-25 NOTE — Progress Notes (Signed)
Subjective:     Patient ID: Terry Franco, male   DOB: Oct 21, 1961, 56 y.o.   MRN: [id number]  HPI  Chief Complaint  Patient presents with  . Hypertension  . Hyperlipidemia  . Back Pain    Chronic issue; but has had a recent flare  . Knee Pain    Right back of knee.  States he is now working a job at a [employer] account [employer]. Reportedly involves a lot of lifting. Also works out with machines including leg raises. Remains on meloxicam and tizanidine. Has previously seen Dr. Sharlet Salina and had physical therapy for his lumbar radiculopathy. He is wishes to update labs pending f/u with his cardiologist, Dr. Nehemiah Massed.   Review of Systems  Respiratory: Negative for shortness of breath.   Cardiovascular: Negative for chest pain and palpitations.       Objective:   Physical Exam  Constitutional: He appears well-developed and well-nourished. No distress.  Cardiovascular: Normal rate and regular rhythm.   Pulmonary/Chest: Breath sounds normal.  Musculoskeletal: He exhibits no edema (of lower extremities).  He is tender at medial insertion of right gastrocnemius tendon. KF/KE 5/5. SLR's to 75 degrees without radiation of back pain. Localizes pain to his right paravertebral area.       Assessment:    1. Essential hypertension - Comprehensive metabolic panel  2. Strain of calf muscle, right, initial encounter  3. Mixed hyperlipidemia - Lipid panel    Plan:    Discussed cross training, icing and use of Tylenol. Further f/u pending lab results. Wishes copy of his labs.

## 2017-06-26 ENCOUNTER — Telehealth: Payer: Self-pay

## 2017-06-26 LAB — LIPID PANEL
CHOL/HDL RATIO: 4.5 ratio (ref 0.0–5.0)
Cholesterol, Total: 184 mg/dL (ref 100–199)
HDL: 41 mg/dL (ref >39)
LDL CALC: 92 mg/dL (ref 0–99)
TRIGLYCERIDES: 254 mg/dL — AB (ref 0–149)
VLDL Cholesterol Cal: 51 mg/dL — ABNORMAL HIGH (ref 5–40)

## 2017-06-26 LAB — COMPREHENSIVE METABOLIC PANEL
ALBUMIN: 4.6 g/dL (ref 3.5–5.5)
ALT: 34 IU/L (ref 0–44)
AST: 25 IU/L (ref 0–40)
Albumin/Globulin Ratio: 2.1 (ref 1.2–2.2)
Alkaline Phosphatase: 48 IU/L (ref 39–117)
BUN / CREAT RATIO: 15 (ref 9–20)
BUN: 16 mg/dL (ref 6–24)
Bilirubin Total: 0.9 mg/dL (ref 0.0–1.2)
CALCIUM: 9.7 mg/dL (ref 8.7–10.2)
CO2: 27 mmol/L (ref 20–29)
Chloride: 101 mmol/L (ref 96–106)
Creatinine, Ser: 1.08 mg/dL (ref 0.76–1.27)
GFR, EST AFRICAN AMERICAN: 88 mL/min/{1.73_m2} (ref >59)
GFR, EST NON AFRICAN AMERICAN: 76 mL/min/{1.73_m2} (ref >59)
GLOBULIN, TOTAL: 2.2 g/dL (ref 1.5–4.5)
Glucose: 104 mg/dL — ABNORMAL HIGH (ref 65–99)
Potassium: 3.9 mmol/L (ref 3.5–5.2)
SODIUM: 142 mmol/L (ref 134–144)
TOTAL PROTEIN: 6.8 g/dL (ref 6.0–8.5)

## 2017-06-26 NOTE — Telephone Encounter (Signed)
-----   Message from [name], [place] sent at 06/26/2017  7:41 AM EDT ----- Labs look ok with minimally elevated glucose. Please provide patient with copy of labs.

## 2017-06-26 NOTE — Telephone Encounter (Signed)
Patient has been advised. KW 

## 2017-07-23 DIAGNOSIS — E782 Mixed hyperlipidemia: Secondary | ICD-10-CM | POA: Diagnosis not present

## 2017-07-23 DIAGNOSIS — I10 Essential (primary) hypertension: Secondary | ICD-10-CM | POA: Diagnosis not present

## 2017-09-04 DIAGNOSIS — K080 Exfoliation of teeth due to systemic causes: Secondary | ICD-10-CM | POA: Diagnosis not present

## 2017-09-11 DIAGNOSIS — S61203A Unspecified open wound of left middle finger without damage to nail, initial encounter: Secondary | ICD-10-CM | POA: Diagnosis not present

## 2017-09-18 DIAGNOSIS — K080 Exfoliation of teeth due to systemic causes: Secondary | ICD-10-CM | POA: Diagnosis not present

## 2017-11-02 ENCOUNTER — Other Ambulatory Visit: Payer: Self-pay | Admitting: Family Medicine

## 2017-11-02 DIAGNOSIS — K120 Recurrent oral aphthae: Secondary | ICD-10-CM

## 2017-12-08 ENCOUNTER — Other Ambulatory Visit: Payer: Self-pay | Admitting: Family Medicine

## 2017-12-08 DIAGNOSIS — E785 Hyperlipidemia, unspecified: Secondary | ICD-10-CM

## 2017-12-24 DIAGNOSIS — M9901 Segmental and somatic dysfunction of cervical region: Secondary | ICD-10-CM | POA: Diagnosis not present

## 2017-12-24 DIAGNOSIS — M5416 Radiculopathy, lumbar region: Secondary | ICD-10-CM | POA: Diagnosis not present

## 2017-12-24 DIAGNOSIS — M62830 Muscle spasm of back: Secondary | ICD-10-CM | POA: Diagnosis not present

## 2017-12-24 DIAGNOSIS — M9903 Segmental and somatic dysfunction of lumbar region: Secondary | ICD-10-CM | POA: Diagnosis not present

## 2017-12-26 DIAGNOSIS — M9903 Segmental and somatic dysfunction of lumbar region: Secondary | ICD-10-CM | POA: Diagnosis not present

## 2017-12-26 DIAGNOSIS — M5416 Radiculopathy, lumbar region: Secondary | ICD-10-CM | POA: Diagnosis not present

## 2017-12-26 DIAGNOSIS — M62830 Muscle spasm of back: Secondary | ICD-10-CM | POA: Diagnosis not present

## 2017-12-26 DIAGNOSIS — M9901 Segmental and somatic dysfunction of cervical region: Secondary | ICD-10-CM | POA: Diagnosis not present

## 2017-12-27 DIAGNOSIS — M5416 Radiculopathy, lumbar region: Secondary | ICD-10-CM | POA: Diagnosis not present

## 2017-12-27 DIAGNOSIS — M62830 Muscle spasm of back: Secondary | ICD-10-CM | POA: Diagnosis not present

## 2017-12-27 DIAGNOSIS — M9901 Segmental and somatic dysfunction of cervical region: Secondary | ICD-10-CM | POA: Diagnosis not present

## 2017-12-27 DIAGNOSIS — M9903 Segmental and somatic dysfunction of lumbar region: Secondary | ICD-10-CM | POA: Diagnosis not present

## 2017-12-31 DIAGNOSIS — M62830 Muscle spasm of back: Secondary | ICD-10-CM | POA: Diagnosis not present

## 2017-12-31 DIAGNOSIS — M9901 Segmental and somatic dysfunction of cervical region: Secondary | ICD-10-CM | POA: Diagnosis not present

## 2017-12-31 DIAGNOSIS — M62838 Other muscle spasm: Secondary | ICD-10-CM | POA: Diagnosis not present

## 2017-12-31 DIAGNOSIS — M5136 Other intervertebral disc degeneration, lumbar region: Secondary | ICD-10-CM | POA: Diagnosis not present

## 2017-12-31 DIAGNOSIS — M9903 Segmental and somatic dysfunction of lumbar region: Secondary | ICD-10-CM | POA: Diagnosis not present

## 2017-12-31 DIAGNOSIS — M5416 Radiculopathy, lumbar region: Secondary | ICD-10-CM | POA: Diagnosis not present

## 2018-01-02 DIAGNOSIS — M62830 Muscle spasm of back: Secondary | ICD-10-CM | POA: Diagnosis not present

## 2018-01-02 DIAGNOSIS — M9901 Segmental and somatic dysfunction of cervical region: Secondary | ICD-10-CM | POA: Diagnosis not present

## 2018-01-02 DIAGNOSIS — M5416 Radiculopathy, lumbar region: Secondary | ICD-10-CM | POA: Diagnosis not present

## 2018-01-02 DIAGNOSIS — M9903 Segmental and somatic dysfunction of lumbar region: Secondary | ICD-10-CM | POA: Diagnosis not present

## 2018-01-03 DIAGNOSIS — M9903 Segmental and somatic dysfunction of lumbar region: Secondary | ICD-10-CM | POA: Diagnosis not present

## 2018-01-03 DIAGNOSIS — M5416 Radiculopathy, lumbar region: Secondary | ICD-10-CM | POA: Diagnosis not present

## 2018-01-03 DIAGNOSIS — M9901 Segmental and somatic dysfunction of cervical region: Secondary | ICD-10-CM | POA: Diagnosis not present

## 2018-01-03 DIAGNOSIS — M62830 Muscle spasm of back: Secondary | ICD-10-CM | POA: Diagnosis not present

## 2018-01-07 DIAGNOSIS — M9901 Segmental and somatic dysfunction of cervical region: Secondary | ICD-10-CM | POA: Diagnosis not present

## 2018-01-07 DIAGNOSIS — M9903 Segmental and somatic dysfunction of lumbar region: Secondary | ICD-10-CM | POA: Diagnosis not present

## 2018-01-07 DIAGNOSIS — M62830 Muscle spasm of back: Secondary | ICD-10-CM | POA: Diagnosis not present

## 2018-01-07 DIAGNOSIS — M5416 Radiculopathy, lumbar region: Secondary | ICD-10-CM | POA: Diagnosis not present

## 2018-01-10 DIAGNOSIS — M62830 Muscle spasm of back: Secondary | ICD-10-CM | POA: Diagnosis not present

## 2018-01-10 DIAGNOSIS — M9901 Segmental and somatic dysfunction of cervical region: Secondary | ICD-10-CM | POA: Diagnosis not present

## 2018-01-10 DIAGNOSIS — M9903 Segmental and somatic dysfunction of lumbar region: Secondary | ICD-10-CM | POA: Diagnosis not present

## 2018-01-10 DIAGNOSIS — M5416 Radiculopathy, lumbar region: Secondary | ICD-10-CM | POA: Diagnosis not present

## 2018-02-07 DIAGNOSIS — M62830 Muscle spasm of back: Secondary | ICD-10-CM | POA: Diagnosis not present

## 2018-02-07 DIAGNOSIS — M9903 Segmental and somatic dysfunction of lumbar region: Secondary | ICD-10-CM | POA: Diagnosis not present

## 2018-02-07 DIAGNOSIS — M5416 Radiculopathy, lumbar region: Secondary | ICD-10-CM | POA: Diagnosis not present

## 2018-02-07 DIAGNOSIS — M9901 Segmental and somatic dysfunction of cervical region: Secondary | ICD-10-CM | POA: Diagnosis not present

## 2018-04-10 ENCOUNTER — Ambulatory Visit: Payer: Federal, State, Local not specified - PPO | Admitting: Family Medicine

## 2018-04-10 ENCOUNTER — Encounter: Payer: Self-pay | Admitting: Family Medicine

## 2018-04-10 VITALS — BP 144/82 | HR 68 | Temp 98.1°F | Resp 16 | Wt 185.0 lb

## 2018-04-10 DIAGNOSIS — K120 Recurrent oral aphthae: Secondary | ICD-10-CM

## 2018-04-10 DIAGNOSIS — J301 Allergic rhinitis due to pollen: Secondary | ICD-10-CM

## 2018-04-10 LAB — POCT RAPID STREP A (OFFICE): Rapid Strep A Screen: NEGATIVE

## 2018-04-10 NOTE — Patient Instructions (Signed)
Discussed use of cromolyn sodium for allergy prevention. Let me know if you would like a temporary tranquilizing medication.

## 2018-04-10 NOTE — Progress Notes (Signed)
Subjective:     Patient ID: Terry Franco, male   DOB: 12/13/1961, 57 y.o.   MRN: [id number] Chief Complaint  Patient presents with  . Sore Throat    Patient comes in today c/o dry cough and he also noticed an ulcer on the back of his tonsils as well. He feels it could be allergy related. However, his mother in law was at his house 3 days ago, and was sick as well.    HPI States he is under a lot of stress with finances and dealing with his daughter';s emotional issues which resulted in her losing her job. He is having related physical sx of oral ulcer, increased reflux, and bp elevation. He has started omeprazole yesterday. Reports allergies are flaring as well.  Review of Systems     Objective:   Physical Exam  Constitutional: He appears well-developed and well-nourished. No distress.  HENT:  Patient localized ulcer to the base of his tongue on his tonsil but I am unable to visualize. No tonsillar enlargement.  Lymphadenopathy:    He has no cervical adenopathy.       Assessment:    1. Seasonal allergic rhinitis due to pollen: discussed adding Nasalcrom to his current medicaton  2. Oral aphthous ulcer: written rx for MVLB mixture 90 mg. - POCT rapid strep A    Plan:    May call for temporary tranquilizing medication if stress not improving.

## 2018-05-10 DIAGNOSIS — M62830 Muscle spasm of back: Secondary | ICD-10-CM | POA: Diagnosis not present

## 2018-05-10 DIAGNOSIS — M9903 Segmental and somatic dysfunction of lumbar region: Secondary | ICD-10-CM | POA: Diagnosis not present

## 2018-05-10 DIAGNOSIS — M9901 Segmental and somatic dysfunction of cervical region: Secondary | ICD-10-CM | POA: Diagnosis not present

## 2018-05-10 DIAGNOSIS — M5416 Radiculopathy, lumbar region: Secondary | ICD-10-CM | POA: Diagnosis not present

## 2018-06-05 DIAGNOSIS — I10 Essential (primary) hypertension: Secondary | ICD-10-CM | POA: Diagnosis not present

## 2018-06-05 DIAGNOSIS — E876 Hypokalemia: Secondary | ICD-10-CM | POA: Diagnosis not present

## 2018-06-05 DIAGNOSIS — R002 Palpitations: Secondary | ICD-10-CM | POA: Diagnosis not present

## 2018-06-05 DIAGNOSIS — E782 Mixed hyperlipidemia: Secondary | ICD-10-CM | POA: Diagnosis not present

## 2018-06-06 ENCOUNTER — Encounter: Payer: Self-pay | Admitting: Family Medicine

## 2018-06-06 ENCOUNTER — Ambulatory Visit: Payer: Federal, State, Local not specified - PPO | Admitting: Family Medicine

## 2018-06-06 VITALS — BP 154/92 | HR 85 | Temp 98.4°F | Resp 98 | Wt 187.0 lb

## 2018-06-06 DIAGNOSIS — E782 Mixed hyperlipidemia: Secondary | ICD-10-CM | POA: Diagnosis not present

## 2018-06-06 DIAGNOSIS — Z8669 Personal history of other diseases of the nervous system and sense organs: Secondary | ICD-10-CM

## 2018-06-06 DIAGNOSIS — H6121 Impacted cerumen, right ear: Secondary | ICD-10-CM | POA: Diagnosis not present

## 2018-06-06 MED ORDER — METOPROLOL SUCCINATE ER 25 MG PO TB24: 25.0000 mg | ORAL_TABLET | Freq: Every day | ORAL | 1 refills | Status: DC

## 2018-06-06 MED ORDER — PRAVASTATIN SODIUM 40 MG PO TABS: 40.0000 mg | ORAL_TABLET | Freq: Every day | ORAL | 3 refills | Status: DC

## 2018-06-06 NOTE — Progress Notes (Signed)
  Subjective:     Patient ID: Terry Franco, male   DOB: 1960/12/24, 57 y.o.   MRN: [id number] Chief Complaint  Patient presents with  . Headache    Patient comes in office today with complaints of headache for several weeks, patient states that he experiences a headache 2-3x a week. Patient states that headache is located on the right side of his head. Associated symptoms are ringing in right ear, shortness of breathe, elevated blood pressure and sensitvity to light and noise.    HPI States he has had migraine- like headaches for years and has had a previous neurology evaluation. He is not sure of triggers but admits to stress from his daughter. No recent eye exam and is trying to cut down on his 3 cups of coffee daily. He has seen his cardiologist recently and no medication adjustment was felt necessary. States he will take tylenol, apply a cold compress, and go to a dark room or use an eye mask.  Review of Systems     Objective:   Physical Exam  Constitutional: He appears well-developed and well-nourished. No distress.  HENT:  Right ear occluded by cerumen: After Irrigation per Kat, ear canal is patent and TM is intact.  Eyes: Pupils are equal, round, and reactive to light. EOM are normal.       Assessment:    1. Hx of migraine headaches: will initiate prophylaxis - metoprolol succinate (TOPROL-XL) 25 MG 24 hr tablet; Take 1 tablet (25 mg total) by mouth daily.  Dispense: 30 tablet; Refill: 1  2. Mixed hyperlipidemia - pravastatin (PRAVACHOL) 40 MG tablet; Take 1 tablet (40 mg total) by mouth daily.  Dispense: 90 tablet; Refill: 3  3. Cerumen debris on tympanic membrane of right ear - EAR CERUMEN REMOVAL    Plan:    Update eye exam and decreased caffeine intake. Continue to use Tylenol or Excedrin migraine to abort headaches.

## 2018-06-06 NOTE — Patient Instructions (Signed)
Please update your eye exam and minimize caffeine use. Continue to use tylenol extra strength or Excedrin Migraine to abort your headaches.

## 2018-07-02 ENCOUNTER — Other Ambulatory Visit: Payer: Self-pay | Admitting: Family Medicine

## 2018-07-02 DIAGNOSIS — Z8669 Personal history of other diseases of the nervous system and sense organs: Secondary | ICD-10-CM

## 2018-07-04 DIAGNOSIS — I10 Essential (primary) hypertension: Secondary | ICD-10-CM | POA: Diagnosis not present

## 2018-07-04 DIAGNOSIS — E7800 Pure hypercholesterolemia, unspecified: Secondary | ICD-10-CM | POA: Diagnosis not present

## 2018-07-08 ENCOUNTER — Ambulatory Visit: Payer: Federal, State, Local not specified - PPO | Admitting: Family Medicine

## 2018-07-08 ENCOUNTER — Encounter: Payer: Self-pay | Admitting: Family Medicine

## 2018-07-08 VITALS — BP 140/100 | HR 63 | Temp 98.0°F | Resp 16 | Wt 182.8 lb

## 2018-07-08 DIAGNOSIS — E782 Mixed hyperlipidemia: Secondary | ICD-10-CM

## 2018-07-08 DIAGNOSIS — Z8669 Personal history of other diseases of the nervous system and sense organs: Secondary | ICD-10-CM | POA: Diagnosis not present

## 2018-07-08 DIAGNOSIS — Z125 Encounter for screening for malignant neoplasm of prostate: Secondary | ICD-10-CM | POA: Diagnosis not present

## 2018-07-08 NOTE — Progress Notes (Signed)
  Subjective:     Patient ID: Terry Franco, Terry Franco   DOB: [birth year], 57 y.o.   MRN: [id number] Chief Complaint  Patient presents with  . Migraine    Patient returns to office today for follow up visit from 06/06/18, at last visit patient was started on Metoprolol Succinate 25mg  he states that it has been helping with his symptoms.   HPI States she has only had one migraine headache since 6/20. He has cut down his caffeine use to 2 cups of coffee daily. He is still pending eye exam. Cardiology recently added  Amlodipine to his regimen. He is going to try harder with the DASH diet. Normal renal profile in June. Will update other labs.  Review of Systems     Objective:   Physical Exam  Constitutional: He appears well-developed and well-nourished. No distress.  Cardiovascular: Normal rate and regular rhythm.  Pulmonary/Chest: Breath sounds normal.  Musculoskeletal: He exhibits no edema (of lower extremities).       Assessment:    1. Hx of migraine headaches: continue metoprolol at present dose.  2. Mixed hyperlipidemia - Lipid panel  3. Screening for prostate cancer - PSA    Plan:    Further f/u pending labs. Encouraged eye exam.

## 2018-07-08 NOTE — Patient Instructions (Addendum)
Do update your eye exam. We will call you with the lab results. Do follow up with the cardiologist as scheduled.

## 2018-07-09 DIAGNOSIS — Z125 Encounter for screening for malignant neoplasm of prostate: Secondary | ICD-10-CM | POA: Diagnosis not present

## 2018-07-09 DIAGNOSIS — E782 Mixed hyperlipidemia: Secondary | ICD-10-CM | POA: Diagnosis not present

## 2018-07-10 ENCOUNTER — Telehealth: Payer: Self-pay

## 2018-07-10 LAB — PSA: Prostate Specific Ag, Serum: 1.8 ng/mL (ref 0.0–4.0)

## 2018-07-10 LAB — LIPID PANEL
CHOL/HDL RATIO: 5.5 ratio — AB (ref 0.0–5.0)
Cholesterol, Total: 191 mg/dL (ref 100–199)
HDL: 35 mg/dL — AB (ref >39)
LDL CALC: 114 mg/dL — AB (ref 0–99)
TRIGLYCERIDES: 210 mg/dL — AB (ref 0–149)
VLDL Cholesterol Cal: 42 mg/dL — ABNORMAL HIGH (ref 5–40)

## 2018-07-10 NOTE — Telephone Encounter (Signed)
Patient was advised he states that last time his cholesterol medication was increased he had muscle cramps so he states he wants to think about it first before making a decision. KW

## 2018-07-10 NOTE — Telephone Encounter (Signed)
-----   Message from [name], [place] sent at 07/10/2018  7:36 AM EDT ----- Prostate test ok. LDL cholesterol has crept up above 100. Would recommend increase in pravastatin. Does he agree?

## 2018-07-30 DIAGNOSIS — M5033 Other cervical disc degeneration, cervicothoracic region: Secondary | ICD-10-CM | POA: Diagnosis not present

## 2018-07-30 DIAGNOSIS — M9903 Segmental and somatic dysfunction of lumbar region: Secondary | ICD-10-CM | POA: Diagnosis not present

## 2018-07-30 DIAGNOSIS — M62830 Muscle spasm of back: Secondary | ICD-10-CM | POA: Diagnosis not present

## 2018-07-30 DIAGNOSIS — M9901 Segmental and somatic dysfunction of cervical region: Secondary | ICD-10-CM | POA: Diagnosis not present

## 2018-08-01 DIAGNOSIS — M9901 Segmental and somatic dysfunction of cervical region: Secondary | ICD-10-CM | POA: Diagnosis not present

## 2018-08-01 DIAGNOSIS — M9903 Segmental and somatic dysfunction of lumbar region: Secondary | ICD-10-CM | POA: Diagnosis not present

## 2018-08-01 DIAGNOSIS — M62830 Muscle spasm of back: Secondary | ICD-10-CM | POA: Diagnosis not present

## 2018-08-01 DIAGNOSIS — M5033 Other cervical disc degeneration, cervicothoracic region: Secondary | ICD-10-CM | POA: Diagnosis not present

## 2018-08-06 DIAGNOSIS — R002 Palpitations: Secondary | ICD-10-CM | POA: Diagnosis not present

## 2018-08-06 DIAGNOSIS — I10 Essential (primary) hypertension: Secondary | ICD-10-CM | POA: Diagnosis not present

## 2018-08-06 DIAGNOSIS — E7800 Pure hypercholesterolemia, unspecified: Secondary | ICD-10-CM | POA: Diagnosis not present

## 2018-09-27 ENCOUNTER — Other Ambulatory Visit: Payer: Self-pay | Admitting: Family Medicine

## 2018-09-27 DIAGNOSIS — K120 Recurrent oral aphthae: Secondary | ICD-10-CM

## 2018-10-03 ENCOUNTER — Ambulatory Visit: Payer: Federal, State, Local not specified - PPO | Admitting: Family Medicine

## 2018-10-03 ENCOUNTER — Encounter: Payer: Self-pay | Admitting: Family Medicine

## 2018-10-03 VITALS — BP 132/72 | HR 61 | Temp 98.1°F | Resp 16 | Wt 184.2 lb

## 2018-10-03 DIAGNOSIS — M5136 Other intervertebral disc degeneration, lumbar region: Secondary | ICD-10-CM

## 2018-10-03 DIAGNOSIS — G8929 Other chronic pain: Secondary | ICD-10-CM | POA: Diagnosis not present

## 2018-10-03 DIAGNOSIS — M545 Low back pain, unspecified: Secondary | ICD-10-CM

## 2018-10-03 NOTE — Progress Notes (Signed)
  Subjective:     Patient ID: Terry Franco, male   DOB: 1961-08-21, 57 y.o.   MRN: [id number] Chief Complaint  Patient presents with  . Back Pain    Patient returns to office today for follow up chronic back pain. Patient reports that he has been having morte muscle stiffness in his back and neck, he reports tightnss and pain primarily in lower back. Patient states that he has seen chiropractor but has not noticed much improvement. Patient reports that cardiologist told him to d/c Meloxicam because it can increase blood pressure. Patient has stopped taking muscle relaxer so he would not be dependent on it.    HPI States he remains active but "can't do anything" without exacerbation of his back pains and spasms/"pulling". Reports paternal hx of rheumatoid arthritis.  Review of Systems     Objective:   Physical Exam  Constitutional: He appears well-developed and well-nourished. No distress.  Muscle strength in lower extremities 5/5. SLR's to 80 degrees without radiation of back pain.     Assessment:    1. DDD (degenerative disc disease), lumbar  2. Chronic bilateral low back pain without sciatica - Sedimentation rate - Rheumatoid Factor - Antinuclear Antib (ANA) - CBC with Differential/Platelet    Plan:    Further f/u pending lab results. Discussed resuming muscle relaxants and using Tylenol up to 3000 mg/day. Consider referral back to Dr. Sharlet Salina or to rheumatology if abnormal labs.

## 2018-10-03 NOTE — Patient Instructions (Addendum)
Resume muscle relaxant and use Tylenol up to 3000 mg/day. Let me know if you wish to see Dr. Sharlet Salina again or a rheumatologist. We will call you with the lab results.

## 2018-10-04 LAB — CBC WITH DIFFERENTIAL/PLATELET
BASOS ABS: 0.1 10*3/uL (ref 0.0–0.2)
Basos: 1 %
EOS (ABSOLUTE): 0.2 10*3/uL (ref 0.0–0.4)
Eos: 3 %
Hematocrit: 44.3 % (ref 37.5–51.0)
Hemoglobin: 15.3 g/dL (ref 13.0–17.7)
IMMATURE GRANS (ABS): 0 10*3/uL (ref 0.0–0.1)
Immature Granulocytes: 0 %
Lymphocytes Absolute: 2.3 10*3/uL (ref 0.7–3.1)
Lymphs: 31 %
MCH: 33.1 pg — AB (ref 26.6–33.0)
MCHC: 34.5 g/dL (ref 31.5–35.7)
MCV: 96 fL (ref 79–97)
MONOCYTES: 7 %
Monocytes Absolute: 0.5 10*3/uL (ref 0.1–0.9)
Neutrophils Absolute: 4.3 10*3/uL (ref 1.4–7.0)
Neutrophils: 58 %
PLATELETS: 314 10*3/uL (ref 150–450)
RBC: 4.62 x10E6/uL (ref 4.14–5.80)
RDW: 13.2 % (ref 12.3–15.4)
WBC: 7.3 10*3/uL (ref 3.4–10.8)

## 2018-10-04 LAB — SEDIMENTATION RATE: Sed Rate: 2 mm/hr (ref 0–30)

## 2018-10-04 LAB — RHEUMATOID FACTOR

## 2018-10-04 LAB — ANA: ANA: NEGATIVE

## 2018-10-23 DIAGNOSIS — M5489 Other dorsalgia: Secondary | ICD-10-CM | POA: Diagnosis not present

## 2018-10-23 DIAGNOSIS — M546 Pain in thoracic spine: Secondary | ICD-10-CM | POA: Diagnosis not present

## 2018-10-23 DIAGNOSIS — M5416 Radiculopathy, lumbar region: Secondary | ICD-10-CM | POA: Diagnosis not present

## 2018-10-23 DIAGNOSIS — M5136 Other intervertebral disc degeneration, lumbar region: Secondary | ICD-10-CM | POA: Diagnosis not present

## 2018-11-27 ENCOUNTER — Encounter: Payer: Self-pay | Admitting: Family Medicine

## 2018-11-27 ENCOUNTER — Other Ambulatory Visit: Payer: Self-pay

## 2018-11-27 ENCOUNTER — Ambulatory Visit: Payer: Federal, State, Local not specified - PPO | Admitting: Family Medicine

## 2018-11-27 VITALS — BP 152/100 | HR 62 | Temp 97.7°F | Ht 69.0 in | Wt 186.4 lb

## 2018-11-27 DIAGNOSIS — I10 Essential (primary) hypertension: Secondary | ICD-10-CM | POA: Diagnosis not present

## 2018-11-27 DIAGNOSIS — M62830 Muscle spasm of back: Secondary | ICD-10-CM

## 2018-11-27 MED ORDER — AMLODIPINE BESYLATE 10 MG PO TABS: 10.0000 mg | ORAL_TABLET | Freq: Every day | ORAL | 0 refills | Status: DC

## 2018-11-27 NOTE — Patient Instructions (Addendum)
Do f/u with Dr. Sharlet Salina and [facility] as scheduled.

## 2018-11-27 NOTE — Progress Notes (Signed)
  Subjective:     Patient ID: Terry Franco, male   DOB: 01/10/1961, 57 y.o.   MRN: [id number] Chief Complaint  Patient presents with  . Back Pain    fup.  was given steroids for 12 days and xray done.  having back spasm   HPI States he has seen Dr. Lorelle Formosa. [facility] And place on a prednisone taper which helped. However he helped someone move and it may have flared up his thoracic back spasms. This greatly affects his quality of life. He has f/u with them on 12/20. He has responded well to meloxicam in the past but was discouraged from taking it by his cardiologist. I have suggested today he resume meloxicam and we would increase his amlodipine pending f/u with PM&R.  Review of Systems     Objective:   Physical Exam  Constitutional: He appears well-developed and well-nourished. No distress.  Psychiatric:  anxious       Assessment:    1. Essential hypertension: increase amlodipine  2. Spasm of thoracic back muscle: resume meloxicam pending follow up with Physical Medicine.    Plan:    Nurse bp check in approx. 2 weeks.

## 2018-12-06 ENCOUNTER — Other Ambulatory Visit: Payer: Self-pay | Admitting: Physical Medicine and Rehabilitation

## 2018-12-06 DIAGNOSIS — M5136 Other intervertebral disc degeneration, lumbar region: Secondary | ICD-10-CM | POA: Diagnosis not present

## 2018-12-06 DIAGNOSIS — M5414 Radiculopathy, thoracic region: Secondary | ICD-10-CM

## 2018-12-06 DIAGNOSIS — M546 Pain in thoracic spine: Secondary | ICD-10-CM

## 2018-12-06 DIAGNOSIS — M549 Dorsalgia, unspecified: Secondary | ICD-10-CM | POA: Diagnosis not present

## 2018-12-06 DIAGNOSIS — M62830 Muscle spasm of back: Secondary | ICD-10-CM | POA: Diagnosis not present

## 2018-12-06 DIAGNOSIS — M5416 Radiculopathy, lumbar region: Secondary | ICD-10-CM | POA: Diagnosis not present

## 2018-12-15 ENCOUNTER — Ambulatory Visit
Admission: RE | Admit: 2018-12-15 | Discharge: 2018-12-15 | Disposition: A | Discharge status: Home / Self Care | Payer: Federal, State, Local not specified - PPO | Source: Ambulatory Visit | Attending: Physical Medicine and Rehabilitation | Admitting: Physical Medicine and Rehabilitation

## 2018-12-15 DIAGNOSIS — M4724 Other spondylosis with radiculopathy, thoracic region: Secondary | ICD-10-CM | POA: Diagnosis not present

## 2018-12-15 DIAGNOSIS — M5414 Radiculopathy, thoracic region: Secondary | ICD-10-CM

## 2018-12-15 DIAGNOSIS — M546 Pain in thoracic spine: Secondary | ICD-10-CM

## 2018-12-22 ENCOUNTER — Ambulatory Visit: Payer: Federal, State, Local not specified - PPO

## 2018-12-26 ENCOUNTER — Other Ambulatory Visit: Payer: Self-pay | Admitting: Family Medicine

## 2018-12-26 DIAGNOSIS — Z8669 Personal history of other diseases of the nervous system and sense organs: Secondary | ICD-10-CM

## 2019-01-02 DIAGNOSIS — S61001A Unspecified open wound of right thumb without damage to nail, initial encounter: Secondary | ICD-10-CM | POA: Diagnosis not present

## 2019-01-27 DIAGNOSIS — E7800 Pure hypercholesterolemia, unspecified: Secondary | ICD-10-CM | POA: Diagnosis not present

## 2019-01-27 DIAGNOSIS — I10 Essential (primary) hypertension: Secondary | ICD-10-CM | POA: Diagnosis not present

## 2019-03-04 ENCOUNTER — Other Ambulatory Visit: Payer: Self-pay | Admitting: Family Medicine

## 2019-03-04 ENCOUNTER — Other Ambulatory Visit: Payer: Self-pay | Admitting: Physician Assistant

## 2019-03-04 DIAGNOSIS — I10 Essential (primary) hypertension: Secondary | ICD-10-CM

## 2019-03-04 MED ORDER — AMLODIPINE BESYLATE 10 MG PO TABS: 10.0000 mg | ORAL_TABLET | Freq: Every day | ORAL | 0 refills | Status: DC

## 2019-03-04 NOTE — Telephone Encounter (Signed)
BP uncontrolled. Patient needs follow up for HTN and other chronic conditions. Please schedule follow up, I have given thirty days of this medication.

## 2019-03-04 NOTE — Telephone Encounter (Signed)
[facility] faxed refill request for the following medications: ° °amLODipine (NORVASC) 10 MG tablet  ° ° °Please advise. °

## 2019-03-04 NOTE — Telephone Encounter (Signed)
LMTCB- Patient need follow-up appointment for HTN and other chronic conditions.

## 2019-03-26 ENCOUNTER — Telehealth: Payer: Self-pay | Admitting: Physician Assistant

## 2019-03-26 DIAGNOSIS — E782 Mixed hyperlipidemia: Secondary | ICD-10-CM

## 2019-03-26 MED ORDER — PRAVASTATIN SODIUM 40 MG PO TABS: 40.0000 mg | ORAL_TABLET | Freq: Every day | ORAL | 0 refills | Status: DC

## 2019-03-26 NOTE — Telephone Encounter (Signed)
Call patient regarding medication refill and scheduling an e-visit. He states that the PA at his cardiologist will give him a refill of the medication. He will contact us if he needs anything else.

## 2019-03-26 NOTE — Telephone Encounter (Signed)
Whomever he is going to see for HTN, he needs to make an appointment with as it is uncontrolled. He has not seen cardiology since 07/2018 and I do not see follow up visit scheduled. If he would like me to further fill his BP medications, which he is about to run out of, he needs to schedule here. If he intends to see cardiology then fine, but he will need to get his refills from them if that's the case.

## 2019-03-26 NOTE — Telephone Encounter (Signed)
Please Review

## 2019-03-26 NOTE — Telephone Encounter (Signed)
He needs to schedule follow up for BP. He can do e-visit if he has BP cuff at home, if not he will need to be seen his BP was not controlled.

## 2019-03-26 NOTE — Telephone Encounter (Signed)
[facility] faxed refill request for the following medications:  pravastatin (PRAVACHOL) 40 MG tablet   90 day supply  LOV: 11/27/2018 with Mikki Santee. Pt was seeing Mikki Santee and pt doesn't have an OV scheduled with new provider. Please advise. Thanks TNP

## 2019-03-27 NOTE — Telephone Encounter (Signed)
I called and advised patient as below. He states he saw the Cardiologist Hilbert Odor with [facility]) on 01/27/2019, and he has a follow up appointment scheduled 08/07/2019.

## 2019-03-27 NOTE — Telephone Encounter (Signed)
Noted  

## 2019-03-29 ENCOUNTER — Other Ambulatory Visit: Payer: Self-pay | Admitting: Physician Assistant

## 2019-03-29 DIAGNOSIS — I10 Essential (primary) hypertension: Secondary | ICD-10-CM

## 2019-03-31 NOTE — Telephone Encounter (Signed)
Please Review

## 2019-04-28 ENCOUNTER — Telehealth: Payer: Self-pay

## 2019-04-28 NOTE — Telephone Encounter (Signed)
Needs e-visit.

## 2019-04-28 NOTE — Telephone Encounter (Signed)
Patient scheduled e-visit for tomorrow.

## 2019-04-28 NOTE — Telephone Encounter (Signed)
Patient called office requesting a call back from [place]. Patient states that on Saturday while cleaning he accidentally got a splatter of bleach in his left eye and states that he flushed his eye out but did not seek medical treatment. Patient states that he has irritation of the left eye and some of the right, he denied blurred vision but states that his eyes are watering. Patient is wanting to know if there is a possible eye drop he can buy or he can be prescribed for eye irritation? KW

## 2019-04-29 ENCOUNTER — Ambulatory Visit (INDEPENDENT_AMBULATORY_CARE_PROVIDER_SITE_OTHER): Payer: Federal, State, Local not specified - PPO | Admitting: Physician Assistant

## 2019-04-29 DIAGNOSIS — T2690XA Corrosion of unspecified eye and adnexa, part unspecified, initial encounter: Secondary | ICD-10-CM | POA: Diagnosis not present

## 2019-04-29 DIAGNOSIS — H10213 Acute toxic conjunctivitis, bilateral: Secondary | ICD-10-CM | POA: Diagnosis not present

## 2019-04-29 MED ORDER — GENTAMICIN SULFATE 0.3 % OP OINT: TOPICAL_OINTMENT | Freq: Two times a day (BID) | OPHTHALMIC | 0 refills | Status: DC

## 2019-04-29 NOTE — Patient Instructions (Signed)
Chemical Burn of the Eyes, Adult Chemicals, including many common household products, can burn your eyes. Burns can happen if a liquid chemical splashes into your eyes. They can also happen if a chemical powder or fume blows in your eyes. Chemicals can injure your eyes long after they first make contact. Chemical eye burns can range from mild to severe. The severity of the burn depends on the chemical, the amount that got on the eye, and how long it was on the eye. Severe eye burns usually involve strong acids or alkalies. Examples include chemicals used in drain cleaner. Serious burns can damage the eyelids and eyeballs and cause scarring. They can result in permanent damage and blindness. What are the causes? This condition may be caused by:  An accident involving a household product, such as a cleaner, detergent, or paint thinner.  An industrial accident, such as an Recruitment consultant, spill, or fall.  An airbag going off in a car accident. Airbags can release a chemical powder. What increases the risk? You may be at greater risk for this condition if you work with chemicals. People who work with chemicals include plumbers, Merchandiser, retail, farmers, swimming pool maintenance workers, Pharmacologist workers, and Actuary. What are the signs or symptoms? Symptoms of this condition include:  Eye pain. This may range from mild to so severe that you do not want to open your eyes.  Mild to severe stinging or burning in the eye.  Eye redness and swelling.  Blurred vision.  Deep eye pain when you are exposed to light. This can develop hours or days after the injury has occurred.  A hole (perforation) in the eyeball. This is rare.  A change in eyelid shape (deformity).  Blindness. How is this diagnosed? This condition is diagnosed with a medical history and a physical exam. Your health care provider will ask what type of chemical came in contact with your eye and ask you how and when the contact happened.  This information helps him or her assess how severe your chemical burn is. After your health care provider washes the chemical off your eye, she or he will examine the surface of your eye and eyelid. An eye specialist (ophthalmologist) should also do an eye exam to determine how severe the injury is and decide what treatment you need. This eye exam should be done within 24 hours to prevent complications and vision loss. How is this treated? This condition is treated by flushing the chemical out of your eye (irrigating your eye) with water or saline right away. Irrigation should continue for up to two hours after the injury occurred. Your health care provider may use a medicated eye drop to ease eye pain while the chemical is rinsed off your eye. After the eye has been cleared of the chemical, treatment may include:  Prescription or over-the-counter medicine to ease pain and inflammation.  Antibiotic drops or ointment applied directly to the eye to prevent or treat infection.  Corticosteroid eye drops or ointments to reduce redness and irritation. Severe burns may also require:  A procedure to remove damaged tissue in or around the eye (debridement).  Placement of a bandage soft contact lens or amniotic membrane tissue on the surface of the eye to protect the eye and help it heal.  Surgery to reconstruct the eye and surrounding tissue, or to replace damaged parts of the eye with eye tissue from a donor. Follow these instructions at home:   Take over-the-counter and prescription medicines only as  told by your health care provider.  Do not drive or use heavy machinery while taking prescription pain medicine.  Do not drive or use heavy machinery if your vision is blurry.  Take or apply your antibiotic medicine, ointment, or drops as told by your health care provider. Do not stop using the antibiotic even if you start to feel better.  Wash your hands with soap and water before you apply eye  drops to your eyes. If soap and water are not available, use hand sanitizer.  Do not take baths, swim, or use a hot tub until your health care provider approves.  Until the inflammation is gone or as long as directed by your health care provider: ? Do not wear contact lenses. Wear glasses instead. ? Do not wear eye makeup. ? Do not touch or rub your eyes.  Return to your normal activities as told by your health care provider. Ask your health care provider what activities are safe for you.  Keep all follow-up visits as told by your health care provider. This is important. How is this prevented?  Wear safety glasses or goggles, a face shield, or a full-face respirator when using potentially dangerous chemicals. This equipment should not block your vision. It should be in good condition, fit properly, and feel comfortable.  Do not combine chemicals from leaking or damaged containers.  Do not dump chemicals down drains, storm sewers, or toilets.  Do not try to burn household chemicals. Mark containers clearly and set them aside in a well-ventilated area until they can be discarded properly.  Do not try to use or work with unlabeled chemical containers.  Use household products only according to directions from the manufacturer.  Use chemicals in well-ventilated areas with plenty of fresh air.  Keep all chemicals out of reach of children and pets.  Do not mix chemicals unless directions on the label tell you to do that. Contact a health care provider if:  Your symptoms do not improve or they get worse.  You have fluid, blood, or pus coming from the eye area. Get help right away if:  You are in severe pain.  You have vision loss. Summary  Chemical eye burns can range from mild to severe.  This condition is diagnosed with a medical history and a physical exam.  This condition is treated by flushing the chemical out of your eye (irrigating) with water or saline right away.   Take over-the-counter and prescription medicines only as told by your health care provider.  Wear safety glasses or goggles, a face shield, or a full-face respirator when using potentially dangerous chemicals. This information is not intended to replace advice given to you by your health care provider. Make sure you discuss any questions you have with your health care provider. Document Released: 02/23/2017 Document Revised: 08/23/2018 Document Reviewed: 02/23/2017 Elsevier Interactive Patient Education  2019 [employer].
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
## 2019-04-30 ENCOUNTER — Telehealth: Payer: Self-pay | Admitting: Physician Assistant

## 2019-04-30 DIAGNOSIS — H109 Unspecified conjunctivitis: Secondary | ICD-10-CM

## 2019-04-30 MED ORDER — ERYTHROMYCIN 5 MG/GM OP OINT: TOPICAL_OINTMENT | OPHTHALMIC | 0 refills | Status: DC

## 2019-04-30 NOTE — Telephone Encounter (Signed)
We received a fax from [facility] stating that gentamicin (GENTAK) 0.3 % ophthalmic ointment is currently unavailable.  Please send alternate rx.

## 2019-04-30 NOTE — Telephone Encounter (Signed)
Sent in new rx 

## 2019-06-24 ENCOUNTER — Telehealth: Payer: Self-pay | Admitting: Family Medicine

## 2019-06-24 DIAGNOSIS — Z8669 Personal history of other diseases of the nervous system and sense organs: Secondary | ICD-10-CM

## 2019-06-24 MED ORDER — METOPROLOL SUCCINATE ER 25 MG PO TB24: 25.0000 mg | ORAL_TABLET | Freq: Every day | ORAL | 1 refills | Status: DC

## 2019-06-24 NOTE — Telephone Encounter (Signed)
[facility] faxed refill request for the following medications:  metoprolol succinate (TOPROL-XL) 25 MG 24 hr tablet    Please advise.

## 2019-08-05 ENCOUNTER — Encounter: Payer: Self-pay | Admitting: Physician Assistant

## 2019-08-05 ENCOUNTER — Ambulatory Visit (INDEPENDENT_AMBULATORY_CARE_PROVIDER_SITE_OTHER): Payer: Federal, State, Local not specified - PPO | Admitting: Physician Assistant

## 2019-08-05 DIAGNOSIS — M791 Myalgia, unspecified site: Secondary | ICD-10-CM

## 2019-08-05 NOTE — Progress Notes (Signed)
Patient: Terry Franco Male    DOB: 10-24-1961   58 y.o.   MRN: [id number] Visit Date: 08/05/2019  Today's Provider: Trinna Post, PA-C   Chief Complaint  Patient presents with  . Muscle Pain   Subjective:    Virtual Visit via Video Note  I connected with Manolito Pawelski on 08/05/19 at  4:00 PM EDT by a video enabled telemedicine application and verified that I am speaking with the correct person using two identifiers.   I discussed the limitations of evaluation and management by telemedicine and the availability of in person appointments. The patient expressed understanding and agreed to proceed.  Patient location: home Provider location: [place] office  Persons involved in the visit: patient, provider    HPI   Reports last two weeks he has had aches and pains. Temperature 96.19F. Pulse 64. Symptoms started Monday and has PND, headahce, fatigue. Denies SOB. Denies coughing. Is actually feeling better today. Concerned because he has an upcoming wedding.   Allergies  Allergen Reactions  . Ibuprofen Swelling  . Versed [Midazolam] Nausea And Vomiting  . Penicillin V Potassium Rash and Other (See Comments)    Has patient had a PCN reaction causing immediate rash, facial/tongue/throat swelling, SOB or lightheadedness with hypotension: unknown Has patient had a PCN reaction causing severe rash involving mucus membranes or skin necrosis: unknown Has patient had a PCN reaction that required hospitalization No Has patient had a PCN reaction occurring within the last 10 years: No If all of the above answers are "NO", then may proceed with Cephalosporin use.      Current Outpatient Medications:  .  amLODipine (NORVASC) 10 MG tablet, Take 1 tablet (10 mg total) by mouth daily for 30 days., Disp: 30 tablet, Rfl: 0 .  loratadine (CLARITIN) 10 MG tablet, Take 10 mg by mouth daily., Disp: , Rfl:  .  losartan-hydrochlorothiazide (HYZAAR) 100-25 MG per  tablet, Take 1 tablet by mouth daily. , Disp: , Rfl:  .  meloxicam (MOBIC) 7.5 MG tablet, Take 7.5 mg by mouth daily., Disp: , Rfl:  .  methocarbamol (ROBAXIN) 750 MG tablet, Take 750 mg by mouth 3 (three) times daily., Disp: , Rfl:  .  metoprolol succinate (TOPROL-XL) 25 MG 24 hr tablet, Take 1 tablet (25 mg total) by mouth daily., Disp: 90 tablet, Rfl: 1 .  pravastatin (PRAVACHOL) 40 MG tablet, Take 1 tablet (40 mg total) by mouth daily., Disp: 90 tablet, Rfl: 0 .  triamcinolone (NASACORT ALLERGY 24HR) 55 MCG/ACT AERO nasal inhaler, Place 2 sprays into the nose daily., Disp: , Rfl:  .  valACYclovir (VALTREX) 1000 MG tablet, TAKE 1 TABLET BY MOUTH DAILY, Disp: 90 tablet, Rfl: 3 .  erythromycin ophthalmic ointment, 0.5 inch in each eye four times daily for 5 days. (Patient not taking: Reported on 08/05/2019), Disp: 3.5 g, Rfl: 0 .  gentamicin (GENTAK) 0.3 % ophthalmic ointment, Place into both eyes 2 (two) times daily. (Patient not taking: Reported on 08/05/2019), Disp: 3.5 g, Rfl: 0 .  Glucosamine-Chondroit-Vit C-Mn (GLUCOSAMINE CHONDR 1500 COMPLX) CAPS, Take by mouth., Disp: , Rfl:  .  montelukast (SINGULAIR) 10 MG tablet, Take 10 mg by mouth daily., Disp: , Rfl: 0 .  PROAIR RESPICLICK 144 (90 Base) MCG/ACT AEPB, Inhale 2 puffs into the lungs every 4 (four) hours as needed (shortness of breath). , Disp: , Rfl: 0  Review of Systems  Constitutional: Negative for appetite change, chills and fever.  Respiratory: Negative  for chest tightness, shortness of breath and wheezing.   Cardiovascular: Negative for chest pain and palpitations.  Gastrointestinal: Negative for abdominal pain, nausea and vomiting.    Social History   Tobacco Use  . Smoking status: Never Smoker  . Smokeless tobacco: Never Used  Substance Use Topics  . Alcohol use: Yes    Alcohol/week: 0.0 standard drinks      Objective:   There were no vitals taken for this visit. There were no vitals filed for this  visit.   Physical Exam Constitutional:      Appearance: Normal appearance. He is not ill-appearing.  Pulmonary:     Effort: No respiratory distress.  Neurological:     Mental Status: He is alert.  Psychiatric:        Mood and Affect: Mood normal.        Behavior: Behavior normal.      No results found for any visits on 08/05/19.     Assessment & Plan    1. Myalgia  Will test for COVID as below.  - Novel Coronavirus, NAA ([facility])  The entirety of the information documented in the History of Present Illness, Review of Systems and Physical Exam were personally obtained by me. Portions of this information were initially documented by April M. Sabra Heck, CMA and reviewed by me for thoroughness and accuracy.      Trinna Post, PA-C  [facility]

## 2019-08-06 ENCOUNTER — Other Ambulatory Visit: Payer: Self-pay | Admitting: Internal Medicine

## 2019-08-06 DIAGNOSIS — Z20822 Contact with and (suspected) exposure to covid-19: Secondary | ICD-10-CM

## 2019-08-06 DIAGNOSIS — R6889 Other general symptoms and signs: Secondary | ICD-10-CM | POA: Diagnosis not present

## 2019-08-07 LAB — NOVEL CORONAVIRUS, NAA: SARS-CoV-2, NAA: NOT DETECTED

## 2019-09-22 ENCOUNTER — Other Ambulatory Visit: Payer: Self-pay | Admitting: Physician Assistant

## 2019-09-22 DIAGNOSIS — K120 Recurrent oral aphthae: Secondary | ICD-10-CM

## 2019-09-22 MED ORDER — VALACYCLOVIR HCL 1 G PO TABS: ORAL_TABLET | ORAL | 0 refills | Status: DC

## 2019-09-22 NOTE — Telephone Encounter (Signed)
[facility] faxed refill request for the following medications: ° °valACYclovir (VALTREX) 1000 MG tablet  ° °Please advise. ° °

## 2019-09-24 ENCOUNTER — Ambulatory Visit (INDEPENDENT_AMBULATORY_CARE_PROVIDER_SITE_OTHER): Payer: Federal, State, Local not specified - PPO

## 2019-09-24 ENCOUNTER — Other Ambulatory Visit: Payer: Self-pay

## 2019-09-24 DIAGNOSIS — Z23 Encounter for immunization: Secondary | ICD-10-CM

## 2019-11-12 ENCOUNTER — Encounter: Payer: Self-pay | Admitting: Physician Assistant

## 2019-12-21 ENCOUNTER — Other Ambulatory Visit: Payer: Self-pay | Admitting: Physician Assistant

## 2019-12-21 DIAGNOSIS — Z8669 Personal history of other diseases of the nervous system and sense organs: Secondary | ICD-10-CM

## 2019-12-21 DIAGNOSIS — K120 Recurrent oral aphthae: Secondary | ICD-10-CM

## 2019-12-25 DIAGNOSIS — Z808 Family history of malignant neoplasm of other organs or systems: Secondary | ICD-10-CM | POA: Diagnosis not present

## 2019-12-25 DIAGNOSIS — L814 Other melanin hyperpigmentation: Secondary | ICD-10-CM | POA: Diagnosis not present

## 2019-12-25 DIAGNOSIS — D1801 Hemangioma of skin and subcutaneous tissue: Secondary | ICD-10-CM | POA: Diagnosis not present

## 2019-12-25 DIAGNOSIS — D224 Melanocytic nevi of scalp and neck: Secondary | ICD-10-CM | POA: Diagnosis not present

## 2019-12-25 DIAGNOSIS — L820 Inflamed seborrheic keratosis: Secondary | ICD-10-CM | POA: Diagnosis not present

## 2019-12-25 DIAGNOSIS — D225 Melanocytic nevi of trunk: Secondary | ICD-10-CM | POA: Diagnosis not present

## 2020-02-17 DIAGNOSIS — L821 Other seborrheic keratosis: Secondary | ICD-10-CM | POA: Diagnosis not present

## 2020-02-17 DIAGNOSIS — D229 Melanocytic nevi, unspecified: Secondary | ICD-10-CM

## 2020-02-17 DIAGNOSIS — D225 Melanocytic nevi of trunk: Secondary | ICD-10-CM | POA: Diagnosis not present

## 2020-02-17 DIAGNOSIS — L988 Other specified disorders of the skin and subcutaneous tissue: Secondary | ICD-10-CM | POA: Diagnosis not present

## 2020-02-17 DIAGNOSIS — D2372 Other benign neoplasm of skin of left lower limb, including hip: Secondary | ICD-10-CM | POA: Diagnosis not present

## 2020-02-17 HISTORY — DX: Melanocytic nevi, unspecified: D22.9

## 2020-02-24 DIAGNOSIS — D225 Melanocytic nevi of trunk: Secondary | ICD-10-CM | POA: Diagnosis not present

## 2020-02-24 DIAGNOSIS — Z4802 Encounter for removal of sutures: Secondary | ICD-10-CM | POA: Diagnosis not present

## 2020-03-06 ENCOUNTER — Ambulatory Visit: Payer: Federal, State, Local not specified - PPO | Attending: Internal Medicine

## 2020-03-06 DIAGNOSIS — Z23 Encounter for immunization: Secondary | ICD-10-CM

## 2020-03-06 NOTE — Progress Notes (Signed)
   Covid-19 Vaccination Clinic  Name:  Terry Franco    MRN: [id number] DOB: [birth year]  03/06/2020  [name] was observed post Covid-19 immunization for 15 minutes without incident. He was provided with Vaccine Information Sheet and instruction to access the V-Safe system.   [name] was instructed to call 911 with any severe reactions post vaccine: Marland Kitchen Difficulty breathing  . Swelling of face and throat  . A fast heartbeat  . A bad rash all over body  . Dizziness and weakness   Immunizations Administered    Name Date Dose VIS Date Route   Pfizer COVID-19 Vaccine 03/06/2020  9:58 AM 0.3 mL 11/28/2019 Intramuscular   Manufacturer: [place]   Lot: [id number]   [place]: [id number]

## 2020-03-20 ENCOUNTER — Other Ambulatory Visit: Payer: Self-pay | Admitting: Physician Assistant

## 2020-03-20 DIAGNOSIS — K120 Recurrent oral aphthae: Secondary | ICD-10-CM
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
## 2020-03-25 ENCOUNTER — Ambulatory Visit: Payer: Federal, State, Local not specified - PPO | Admitting: Physician Assistant

## 2020-03-25 ENCOUNTER — Other Ambulatory Visit: Payer: Self-pay

## 2020-03-25 VITALS — BP 151/90 | HR 69 | Temp 97.2°F | Ht 69.0 in | Wt 188.0 lb

## 2020-03-25 DIAGNOSIS — H6121 Impacted cerumen, right ear: Secondary | ICD-10-CM

## 2020-03-25 NOTE — Progress Notes (Signed)
Patient: Regis Mansouri Male    DOB: [birth year]   59 y.o.   MRN: [id number] Visit Date: 03/25/2020  Today's Provider: Trinna Post, PA-C   No chief complaint on file.  Subjective:     Ear Fullness  There is pain in both ears. This is a recurrent problem. The problem occurs constantly. The problem has been unchanged. There has been no fever. The pain is mild. He has tried ear drops for the symptoms. The treatment provided no relief.   He has tried debrox drops yesterday without issue.  Allergies  Allergen Reactions  . Ibuprofen Swelling  . Versed [Midazolam] Nausea And Vomiting  . Penicillin V Potassium Rash and Other (See Comments)    Has patient had a PCN reaction causing immediate rash, facial/tongue/throat swelling, SOB or lightheadedness with hypotension: unknown Has patient had a PCN reaction causing severe rash involving mucus membranes or skin necrosis: unknown Has patient had a PCN reaction that required hospitalization No Has patient had a PCN reaction occurring within the last 10 years: No If all of the above answers are "NO", then may proceed with Cephalosporin use.      Current Outpatient Medications:  .  amLODipine (NORVASC) 10 MG tablet, Take 1 tablet (10 mg total) by mouth daily for 30 days., Disp: 30 tablet, Rfl: 0 .  erythromycin ophthalmic ointment, 0.5 inch in each eye four times daily for 5 days. (Patient not taking: Reported on 08/05/2019), Disp: 3.5 g, Rfl: 0 .  gentamicin (GENTAK) 0.3 % ophthalmic ointment, Place into both eyes 2 (two) times daily. (Patient not taking: Reported on 08/05/2019), Disp: 3.5 g, Rfl: 0 .  Glucosamine-Chondroit-Vit C-Mn (GLUCOSAMINE CHONDR 1500 COMPLX) CAPS, Take by mouth., Disp: , Rfl:  .  loratadine (CLARITIN) 10 MG tablet, Take 10 mg by mouth daily., Disp: , Rfl:  .  losartan-hydrochlorothiazide (HYZAAR) 100-25 MG per tablet, Take 1 tablet by mouth daily. , Disp: , Rfl:  .  meloxicam (MOBIC) 7.5 MG tablet, Take 7.5  mg by mouth daily., Disp: , Rfl:  .  methocarbamol (ROBAXIN) 750 MG tablet, Take 750 mg by mouth 3 (three) times daily., Disp: , Rfl:  .  metoprolol succinate (TOPROL-XL) 25 MG 24 hr tablet, TAKE 1 TABLET(25 MG) BY MOUTH DAILY, Disp: 90 tablet, Rfl: 1 .  montelukast (SINGULAIR) 10 MG tablet, Take 10 mg by mouth daily., Disp: , Rfl: 0 .  pravastatin (PRAVACHOL) 40 MG tablet, Take 1 tablet (40 mg total) by mouth daily., Disp: 90 tablet, Rfl: 0 .  PROAIR RESPICLICK 123XX123 (90 Base) MCG/ACT AEPB, Inhale 2 puffs into the lungs every 4 (four) hours as needed (shortness of breath). , Disp: , Rfl: 0 .  triamcinolone (NASACORT ALLERGY 24HR) 55 MCG/ACT AERO nasal inhaler, Place 2 sprays into the nose daily., Disp: , Rfl:  .  valACYclovir (VALTREX) 1000 MG tablet, TAKE 1 TABLET BY MOUTH DAILY, Disp: 90 tablet, Rfl: 0  Review of Systems  Constitutional: Negative.   HENT: Positive for ear pain.   Eyes: Negative.   Respiratory: Negative.   Cardiovascular: Negative.   Gastrointestinal: Negative.   Endocrine: Negative.   Genitourinary: Negative.   Musculoskeletal: Negative.   Skin: Negative.   Neurological: Negative.   Hematological: Negative.   Psychiatric/Behavioral: Negative.     Social History   Tobacco Use  . Smoking status: Never Smoker  . Smokeless tobacco: Never Used  Substance Use Topics  . Alcohol use: Yes    Alcohol/week: 0.0 standard  drinks      Objective:   There were no vitals taken for this visit. There were no vitals filed for this visit.There is no height or weight on file to calculate BMI.   Physical Exam HENT:     Right Ear: There is impacted cerumen.     Left Ear: Tympanic membrane and ear canal normal. There is no impacted cerumen.     Ears:     Comments: Right ear with impacted cerumen. After lavage, impaction has been removed. There is some erythema and mild bleeding of ear canal.     No results found for any visits on 03/25/20.     Assessment & Plan    1.  Impacted cerumen of right ear  Resolved with ear lavage. Some slight bleeding of ear canal after removal. If patient has pain in his ear canal after several days I recommend he message me and I will send him in antibiotic ear drops.   The entirety of the information documented in the History of Present Illness, Review of Systems and Physical Exam were personally obtained by me. Portions of this information were initially documented by [facility], CMA and reviewed by me for thoroughness and accuracy.    I have spent 15 minutes with this patient, >50% of which was spent on counseling and coordination of care.     Trinna Post, PA-C  [facility]

## 2020-03-27 ENCOUNTER — Ambulatory Visit: Payer: Federal, State, Local not specified - PPO | Attending: Internal Medicine

## 2020-03-27 DIAGNOSIS — Z23 Encounter for immunization: Secondary | ICD-10-CM

## 2020-03-27 NOTE — Progress Notes (Signed)
   Covid-19 Vaccination Clinic  Name:  Jammel Hanko    MRN: [id number] DOB: 1961-04-09  03/27/2020  Mr. Gair was observed post Covid-19 immunization for 15 minutes without incident. He was provided with Vaccine Information Sheet and instruction to access the V-Safe system.   Mr. Montanari was instructed to call 911 with any severe reactions post vaccine: Marland Kitchen Difficulty breathing  . Swelling of face and throat  . A fast heartbeat  . A bad rash all over body  . Dizziness and weakness   Immunizations Administered    Name Date Dose VIS Date Route   Pfizer COVID-19 Vaccine 03/27/2020  9:12 AM 0.3 mL 11/28/2019 Intramuscular   Manufacturer: [place]   Lot: [id number] [id number]   [place]: [id number]

## 2020-05-19 DIAGNOSIS — I10 Essential (primary) hypertension: Secondary | ICD-10-CM | POA: Diagnosis not present

## 2020-05-19 DIAGNOSIS — E7800 Pure hypercholesterolemia, unspecified: Secondary | ICD-10-CM | POA: Diagnosis not present

## 2020-05-20 ENCOUNTER — Ambulatory Visit (INDEPENDENT_AMBULATORY_CARE_PROVIDER_SITE_OTHER): Payer: Federal, State, Local not specified - PPO | Admitting: Psychology

## 2020-05-20 DIAGNOSIS — F4323 Adjustment disorder with mixed anxiety and depressed mood: Secondary | ICD-10-CM | POA: Diagnosis not present

## 2020-05-20 DIAGNOSIS — E7800 Pure hypercholesterolemia, unspecified: Secondary | ICD-10-CM | POA: Diagnosis not present

## 2020-05-21 ENCOUNTER — Telehealth: Payer: Self-pay

## 2020-05-21 ENCOUNTER — Encounter: Payer: Self-pay | Admitting: Physician Assistant

## 2020-05-21 ENCOUNTER — Ambulatory Visit: Payer: Federal, State, Local not specified - PPO | Admitting: Physician Assistant

## 2020-05-21 ENCOUNTER — Other Ambulatory Visit: Payer: Self-pay

## 2020-05-21 VITALS — BP 128/88 | HR 67 | Temp 96.2°F | Wt 186.2 lb

## 2020-05-21 DIAGNOSIS — W57XXXA Bitten or stung by nonvenomous insect and other nonvenomous arthropods, initial encounter: Secondary | ICD-10-CM | POA: Diagnosis not present

## 2020-05-21 DIAGNOSIS — L299 Pruritus, unspecified: Secondary | ICD-10-CM

## 2020-05-21 NOTE — Progress Notes (Signed)
Established patient visit   Patient: Terry Franco   DOB: December 27, 1960   59 y.o. Male  MRN: [id number] Visit Date: 05/21/2020  Today's healthcare provider: Trinna Post, PA-C   Chief Complaint  Patient presents with  . Animal Bite    Tick  I,Allisson Schindel M Amaya Blakeman,acting as a scribe for Trinna Post, PA-C.,have documented all relevant documentation on the behalf of Trinna Post, PA-C,as directed by  Trinna Post, PA-C while in the presence of Trinna Post, PA-C.  Subjective    HPI  Tick Bite Patient presents today for a tick bite. Patient noticed a tick biting him this morning at 2:15 am in the buttock region.  Patients wife removed the tick and now he states the area is red and has a rash. Patient reports cutting grass on Monday, 05/17/2020 and doesn't know if that's when the tick was attached. No myalgias, no rash anywhere else      Medications: Outpatient Medications Prior to Visit  Medication Sig  . Glucosamine-Chondroit-Vit C-Mn (GLUCOSAMINE CHONDR 1500 COMPLX) CAPS Take by mouth.  . loratadine (CLARITIN) 10 MG tablet Take 10 mg by mouth daily.  Marland Kitchen losartan-hydrochlorothiazide (HYZAAR) 100-25 MG per tablet Take 1 tablet by mouth daily.   . meloxicam (MOBIC) 7.5 MG tablet Take 7.5 mg by mouth daily.  . methocarbamol (ROBAXIN) 750 MG tablet Take 750 mg by mouth 3 (three) times daily.  . metoprolol succinate (TOPROL-XL) 25 MG 24 hr tablet TAKE 1 TABLET(25 MG) BY MOUTH DAILY  . montelukast (SINGULAIR) 10 MG tablet Take 10 mg by mouth daily.  . pravastatin (PRAVACHOL) 40 MG tablet Take 1 tablet (40 mg total) by mouth daily.  Marland Kitchen PROAIR RESPICLICK 824 (90 Base) MCG/ACT AEPB Inhale 2 puffs into the lungs every 4 (four) hours as needed (shortness of breath).   . triamcinolone (NASACORT ALLERGY 24HR) 55 MCG/ACT AERO nasal inhaler Place 2 sprays into the nose daily.  . valACYclovir (VALTREX) 1000 MG tablet TAKE 1 TABLET BY MOUTH DAILY  . amLODipine (NORVASC) 10 MG  tablet Take 1 tablet (10 mg total) by mouth daily for 30 days.  Marland Kitchen erythromycin ophthalmic ointment 0.5 inch in each eye four times daily for 5 days. (Patient not taking: Reported on 08/05/2019)  . gentamicin (GENTAK) 0.3 % ophthalmic ointment Place into both eyes 2 (two) times daily. (Patient not taking: Reported on 08/05/2019)   No facility-administered medications prior to visit.    Review of Systems    Objective    BP 128/88 (BP Location: Left Arm, Patient Position: Sitting, Cuff Size: Normal)   Pulse 67   Temp (!) 96.2 F (35.7 C) (Temporal)   Wt 186 lb 3.2 oz (84.5 kg)   SpO2 97%   BMI 27.50 kg/m    Physical Exam Constitutional:      Appearance: Normal appearance.  Cardiovascular:     Rate and Rhythm: Normal rate.  Pulmonary:     Effort: Pulmonary effort is normal.  Skin:    General: Skin is warm and dry.     Findings: No rash.     Comments: Patient declines exam of the bite.   Neurological:     Mental Status: He is alert. Mental status is at baseline.  Psychiatric:        Mood and Affect: Mood normal.        Behavior: Behavior normal.       No results found for any visits on 05/21/20.  Assessment & Plan  1. Itching  Tick seems to have been attached for a very short period of time. Advised on watchful waiting. Counseled on return precautions.  2. Tick bite, initial encounter    Return if symptoms worsen or fail to improve.      ITrinna Post, PA-C, have reviewed all documentation for this visit. The documentation on 05/21/20 for the exam, diagnosis, procedures, and orders are all accurate and complete.    Paulene Floor  [facility] [phone] (phone) [phone] (fax)  [place]

## 2020-05-21 NOTE — Telephone Encounter (Signed)
Copied from [place] [id number]. Topic: Clinical - COVID Pre-Screen >> May 21, 2020  8:14 AM Leward Quan A wrote: 1. To the best of your knowledge, have you been in close contact with anyone with a confirmed diagnosis of COVID 19?  no  If no - Proceed to next question; If yes - Schedule patient for a virtual visit  2. Have you had any one or more of the following: fever, chills, cough, shortness of breath or any flu-like symptoms?  no  If no - Proceed to next question; If yes - Schedule patient for a virtual visit  3. Have you been diagnosed with or have a previous diagnosis of COVID 19?  no  If no - Proceed to next question; If yes - Schedule patient for a virtual visit  4. I am going to go over a few other symptoms with you. Please let me know if you are experiencing any of the following: no  Ear, nose or throat discomfort  A sore throat  Headache  Muscle pain  Diarrhea  Loss of taste or smell  If no - Continue with scheduling process; If yes - Document in scheduling notes   Thank you for answering these questions. Please know we will ask you these questions or similar questions when you arrive for your appointment and again it's how we are keeping everyone safe. Also, to keep you safe, please use the provided hand sanitizer when you enter the building. Terry Franco, we are asking everyone in the building to wear a mask because they help [place] prevent the spread of germs.   Do you have a mask of your own, if not, we are happy to provide one for you. The last thing I want to go over with you is the no visitor guidelines. This means no one can attend the appointment with you unless you need physical assistance. I understand this may be different from your past appointments and I know this may be difficult but please know if someone is driving you we are happy to call them for you once your appointment is over.

## 2020-05-21 NOTE — Patient Instructions (Signed)
Hydrocortisone 1% cream on area.

## 2020-05-26 ENCOUNTER — Telehealth: Payer: Self-pay

## 2020-05-26 DIAGNOSIS — W57XXXS Bitten or stung by nonvenomous insect and other nonvenomous arthropods, sequela: Secondary | ICD-10-CM

## 2020-05-26 MED ORDER — DOXYCYCLINE HYCLATE 100 MG PO TABS: 100.0000 mg | ORAL_TABLET | Freq: Two times a day (BID) | ORAL | 0 refills | Status: AC

## 2020-05-26 NOTE — Telephone Encounter (Signed)
Doxycycline sent

## 2020-05-26 NOTE — Telephone Encounter (Signed)
Copied from [place] [phone]. Topic: General - Other >> May 26, 2020  2:12 PM Keene Breath wrote: Reason for CRM: Patient called to inform the nurse or doctor that he has developed a rash at the site of his tic bite since seeing the doctor.  He stated that doctor wanted him to call and let her know so that he could be prescribed some medication.  Patient also indicated that he is leaving to go out of town for a funeral on Friday.  Please advise and send  some medication as soon as possible to patient's local pharmacy.  CB# [phone]

## 2020-05-26 NOTE — Addendum Note (Signed)
Addended by: Trinna Post on: 05/26/2020 03:48 PM   Modules accepted: Orders

## 2020-05-26 NOTE — Telephone Encounter (Signed)
Patient was advised.  

## 2020-06-03 ENCOUNTER — Ambulatory Visit (INDEPENDENT_AMBULATORY_CARE_PROVIDER_SITE_OTHER): Payer: Federal, State, Local not specified - PPO | Admitting: Psychology

## 2020-06-03 DIAGNOSIS — F4323 Adjustment disorder with mixed anxiety and depressed mood: Secondary | ICD-10-CM

## 2020-06-09 ENCOUNTER — Ambulatory Visit (INDEPENDENT_AMBULATORY_CARE_PROVIDER_SITE_OTHER): Payer: Federal, State, Local not specified - PPO | Admitting: Psychology

## 2020-06-09 DIAGNOSIS — F4323 Adjustment disorder with mixed anxiety and depressed mood: Secondary | ICD-10-CM | POA: Diagnosis not present

## 2020-06-18 ENCOUNTER — Other Ambulatory Visit: Payer: Self-pay | Admitting: Physician Assistant

## 2020-06-18 DIAGNOSIS — K120 Recurrent oral aphthae: Secondary | ICD-10-CM

## 2020-06-21 ENCOUNTER — Other Ambulatory Visit: Payer: Self-pay | Admitting: Physician Assistant

## 2020-06-21 DIAGNOSIS — K120 Recurrent oral aphthae: Secondary | ICD-10-CM

## 2020-06-23 ENCOUNTER — Ambulatory Visit (INDEPENDENT_AMBULATORY_CARE_PROVIDER_SITE_OTHER): Payer: Federal, State, Local not specified - PPO | Admitting: Psychology

## 2020-06-23 DIAGNOSIS — F4323 Adjustment disorder with mixed anxiety and depressed mood: Secondary | ICD-10-CM | POA: Diagnosis not present

## 2020-06-28 ENCOUNTER — Other Ambulatory Visit: Payer: Self-pay

## 2020-06-28 ENCOUNTER — Ambulatory Visit: Payer: Federal, State, Local not specified - PPO | Admitting: Dermatology

## 2020-06-28 ENCOUNTER — Encounter: Payer: Self-pay | Admitting: Dermatology

## 2020-06-28 DIAGNOSIS — Z86018 Personal history of other benign neoplasm: Secondary | ICD-10-CM | POA: Diagnosis not present

## 2020-06-28 DIAGNOSIS — Z1283 Encounter for screening for malignant neoplasm of skin: Secondary | ICD-10-CM | POA: Diagnosis not present

## 2020-06-28 DIAGNOSIS — L578 Other skin changes due to chronic exposure to nonionizing radiation: Secondary | ICD-10-CM

## 2020-06-28 DIAGNOSIS — W57XXXA Bitten or stung by nonvenomous insect and other nonvenomous arthropods, initial encounter: Secondary | ICD-10-CM

## 2020-06-28 DIAGNOSIS — D2371 Other benign neoplasm of skin of right lower limb, including hip: Secondary | ICD-10-CM | POA: Diagnosis not present

## 2020-06-28 DIAGNOSIS — S80862A Insect bite (nonvenomous), left lower leg, initial encounter: Secondary | ICD-10-CM

## 2020-06-28 DIAGNOSIS — D1800 Hemangioma unspecified site: Secondary | ICD-10-CM

## 2020-06-28 DIAGNOSIS — L573 Poikiloderma of Civatte: Secondary | ICD-10-CM | POA: Diagnosis not present

## 2020-06-28 DIAGNOSIS — D239 Other benign neoplasm of skin, unspecified: Secondary | ICD-10-CM

## 2020-06-28 DIAGNOSIS — S80861A Insect bite (nonvenomous), right lower leg, initial encounter: Secondary | ICD-10-CM

## 2020-06-28 DIAGNOSIS — L814 Other melanin hyperpigmentation: Secondary | ICD-10-CM

## 2020-06-28 DIAGNOSIS — D229 Melanocytic nevi, unspecified: Secondary | ICD-10-CM

## 2020-06-28 DIAGNOSIS — L821 Other seborrheic keratosis: Secondary | ICD-10-CM

## 2020-06-28 DIAGNOSIS — D2372 Other benign neoplasm of skin of left lower limb, including hip: Secondary | ICD-10-CM

## 2020-06-28 NOTE — Progress Notes (Signed)
   Follow-Up Visit   Subjective  Terry Franco is a 59 y.o. male who presents for the following: Annual Exam (Lesion on the R hip area that has been there a few months, and a red spot on his L neck that he would like checked). The patient presents for Total-Body Skin Exam (TBSE) for skin cancer screening and mole check.  The following portions of the chart were reviewed this encounter and updated as appropriate:  Tobacco  Allergies  Meds  Problems  Med Hx  Surg Hx  Fam Hx     Review of Systems:  No other skin or systemic complaints except as noted in HPI or Assessment and Plan.  Objective  Well appearing patient in no apparent distress; mood and affect are within normal limits.  A full examination was performed including scalp, head, eyes, ears, nose, lips, neck, chest, axillae, abdomen, back, buttocks, bilateral upper extremities, bilateral lower extremities, hands, feet, fingers, toes, fingernails, and toenails. All findings within normal limits unless otherwise noted below.  Objective  R epigastric: Scar with no evidence of recurrence.   Objective  neck: Erythema  Objective  legs: Firm pink/brown papulenodule with dimple sign.   Objective  B/L legs: Erythematous papule   Assessment & Plan    History of dysplastic nevus R epigastric  Clear. Observe for recurrence. Call clinic for new or changing lesions.  Recommend regular skin exams, daily broad-spectrum spf 30+ sunscreen use, and photoprotection.     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    Dermatofibroma legs  Benign, observe.    Bug bite without infection, initial encounter B/L legs  Resolving - Benign, observe.    Skin cancer screening   Lentigines - Scattered tan macules - Discussed due to sun exposure - Benign, observe - Call for any changes  Seborrheic Keratoses - Stuck-on, waxy, tan-brown papules and plaques  - Discussed benign etiology and prognosis. - Observe -  Call for any changes  Melanocytic Nevi - Tan-brown and/or pink-flesh-colored symmetric macules and papules - Benign appearing on exam today - Observation - Call clinic for new or changing moles - Recommend daily use of broad spectrum spf 30+ sunscreen to sun-exposed areas.   Hemangiomas - Red papules - Discussed benign nature - Observe - Call for any changes  Actinic Damage - diffuse scaly erythematous macules with underlying dyspigmentation - Recommend daily broad spectrum sunscreen SPF 30+ to sun-exposed areas, reapply every 2 hours as needed.  - Call for new or changing lesions.  Skin cancer screening performed today.  Return in about 1 year (around 06/28/2021) for TBSE.  Luther Redo, CMA, am acting as scribe for Sarina Ser, MD .  Documentation: I have reviewed the above documentation for accuracy and completeness, and I agree with the above.  Sarina Ser, MD

## 2020-06-29 ENCOUNTER — Encounter: Payer: Self-pay | Admitting: Dermatology

## 2020-07-07 ENCOUNTER — Ambulatory Visit (INDEPENDENT_AMBULATORY_CARE_PROVIDER_SITE_OTHER): Payer: Federal, State, Local not specified - PPO | Admitting: Psychology

## 2020-07-07 DIAGNOSIS — F4323 Adjustment disorder with mixed anxiety and depressed mood: Secondary | ICD-10-CM | POA: Diagnosis not present

## 2020-07-21 ENCOUNTER — Ambulatory Visit (INDEPENDENT_AMBULATORY_CARE_PROVIDER_SITE_OTHER): Payer: Federal, State, Local not specified - PPO | Admitting: Psychology

## 2020-07-21 DIAGNOSIS — F4323 Adjustment disorder with mixed anxiety and depressed mood: Secondary | ICD-10-CM | POA: Diagnosis not present

## 2020-08-11 ENCOUNTER — Ambulatory Visit (INDEPENDENT_AMBULATORY_CARE_PROVIDER_SITE_OTHER): Payer: Federal, State, Local not specified - PPO | Admitting: Psychology

## 2020-08-11 DIAGNOSIS — F4323 Adjustment disorder with mixed anxiety and depressed mood: Secondary | ICD-10-CM | POA: Diagnosis not present

## 2020-09-15 ENCOUNTER — Ambulatory Visit (INDEPENDENT_AMBULATORY_CARE_PROVIDER_SITE_OTHER): Payer: Federal, State, Local not specified - PPO | Admitting: Psychology

## 2020-09-15 DIAGNOSIS — F4323 Adjustment disorder with mixed anxiety and depressed mood: Secondary | ICD-10-CM | POA: Diagnosis not present

## 2020-09-16 ENCOUNTER — Other Ambulatory Visit: Payer: Self-pay | Admitting: Physician Assistant

## 2020-09-16 DIAGNOSIS — K120 Recurrent oral aphthae: Secondary | ICD-10-CM

## 2020-09-16 NOTE — Telephone Encounter (Signed)
Requested Prescriptions  Pending Prescriptions Disp Refills  . valACYclovir (VALTREX) 1000 MG tablet [Pharmacy Med Name: VALACYCLOVIR 1GM TABLETS] 90 tablet 0    Sig: TAKE 1 TABLET BY MOUTH DAILY     Antimicrobials:  Antiviral Agents - Anti-Herpetic Passed - 09/16/2020  6:34 AM      Passed - Valid encounter within last 12 months    Recent Outpatient Visits          3 months ago [place], PA-C   5 months ago Impacted cerumen of right ear   [facility] [place], Wendee Beavers, [place]   1 year ago [place], [place]   1 year ago Chemical injury of eye, unspecified laterality, initial encounter   [facility] [place], Wendee Beavers, PA-C   1 year ago Essential hypertension   [facility] [place], [place]

## 2020-09-29 ENCOUNTER — Other Ambulatory Visit: Payer: Self-pay

## 2020-09-29 ENCOUNTER — Ambulatory Visit: Payer: Federal, State, Local not specified - PPO | Admitting: Physician Assistant

## 2020-09-29 ENCOUNTER — Encounter: Payer: Self-pay | Admitting: Physician Assistant

## 2020-09-29 VITALS — BP 129/84 | HR 54 | Temp 98.5°F | Resp 16 | Ht 69.0 in | Wt 177.8 lb

## 2020-09-29 DIAGNOSIS — H6121 Impacted cerumen, right ear: Secondary | ICD-10-CM

## 2020-09-29 NOTE — Patient Instructions (Signed)
Health Maintenance After Age 61 After age 61, you are at a higher risk for certain long-term diseases and infections as well as injuries from falls. Falls are a major cause of broken bones and head injuries in people who are older than age 61. Getting regular preventive care can help to keep you healthy and well. Preventive care includes getting regular testing and making lifestyle changes as recommended by your health care provider. Talk with your health care provider about:  Which screenings and tests you should have. A screening is a test that checks for a disease when you have no symptoms.  A diet and exercise plan that is right for you. What should I know about screenings and tests to prevent falls? Screening and testing are the best ways to find a health problem early. Early diagnosis and treatment give you the best chance of managing medical conditions that are common after age 61. Certain conditions and lifestyle choices may make you more likely to have a fall. Your health care provider may recommend:  Regular vision checks. Poor vision and conditions such as cataracts can make you more likely to have a fall. If you wear glasses, make sure to get your prescription updated if your vision changes.  Medicine review. Work with your health care provider to regularly review all of the medicines you are taking, including over-the-counter medicines. Ask your health care provider about any side effects that may make you more likely to have a fall. Tell your health care provider if any medicines that you take make you feel dizzy or sleepy.  Osteoporosis screening. Osteoporosis is a condition that causes the bones to get weaker. This can make the bones weak and cause them to break more easily.  Blood pressure screening. Blood pressure changes and medicines to control blood pressure can make you feel dizzy.  Strength and balance checks. Your health care provider may recommend certain tests to check your  strength and balance while standing, walking, or changing positions.  Foot health exam. Foot pain and numbness, as well as not wearing proper footwear, can make you more likely to have a fall.  Depression screening. You may be more likely to have a fall if you have a fear of falling, feel emotionally low, or feel unable to do activities that you used to do.  Alcohol use screening. Using too much alcohol can affect your balance and may make you more likely to have a fall. What actions can I take to lower my risk of falls? General instructions  Talk with your health care provider about your risks for falling. Tell your health care provider if: ? You fall. Be sure to tell your health care provider about all falls, even ones that seem minor. ? You feel dizzy, sleepy, or off-balance.  Take over-the-counter and prescription medicines only as told by your health care provider. These include any supplements.  Eat a healthy diet and maintain a healthy weight. A healthy diet includes low-fat dairy products, low-fat (lean) meats, and fiber from whole grains, beans, and lots of fruits and vegetables. Home safety  Remove any tripping hazards, such as rugs, cords, and clutter.  Install safety equipment such as grab bars in bathrooms and safety rails on stairs.  Keep rooms and walkways well-lit. Activity   Follow a regular exercise program to stay fit. This will help you maintain your balance. Ask your health care provider what types of exercise are appropriate for you.  If you need a cane or   walker, use it as recommended by your health care provider.  Wear supportive shoes that have nonskid soles. Lifestyle  Do not drink alcohol if your health care provider tells you not to drink.  If you drink alcohol, limit how much you have: ? 0-1 drink a day for women. ? 0-2 drinks a day for men.  Be aware of how much alcohol is in your drink. In the [place] one drink equals one typical bottle of beer (12  oz), one-half glass of wine (5 oz), or one shot of hard liquor (1 oz).  Do not use any products that contain nicotine or tobacco, such as cigarettes and e-cigarettes. If you need help quitting, ask your health care provider. Summary  Having a healthy lifestyle and getting preventive care can help to protect your health and wellness after age 61.  Screening and testing are the best way to find a health problem early and help you avoid having a fall. Early diagnosis and treatment give you the best chance for managing medical conditions that are more common for people who are older than age 61.  Falls are a major cause of broken bones and head injuries in people who are older than age 61. Take precautions to prevent a fall at home.  Work with your health care provider to learn what changes you can make to improve your health and wellness and to prevent falls. This information is not intended to replace advice given to you by your health care provider. Make sure you discuss any questions you have with your health care provider. Document Revised: 03/27/2019 Document Reviewed: 10/17/2017 Elsevier Patient Education  2020 Elsevier Inc.  

## 2020-09-29 NOTE — Progress Notes (Signed)
Established patient visit   Patient: Terry Franco   DOB: 1961-02-15   59 y.o. Male  MRN: [id number] Visit Date: 09/29/2020  Today's healthcare provider: Trinna Post, PA-C   Chief Complaint  Patient presents with  . Ear Fullness   Subjective    Ear Fullness  There is pain in the right ear. This is a recurrent problem. The current episode started 1 to 4 weeks ago (2 weeks ). The problem has been gradually worsening. There has been no fever. The pain is moderate. Associated symptoms include headaches. Pertinent negatives include no coughing, ear discharge, neck pain, rhinorrhea or sore throat. He has tried ear drops for the symptoms. The treatment provided no relief.    Wt Readings from Last 3 Encounters:  09/29/20 177 lb 12.8 oz (80.6 kg)  05/21/20 186 lb 3.2 oz (84.5 kg)  03/25/20 188 lb (85.3 kg)       Medications: Outpatient Medications Prior to Visit  Medication Sig  . loratadine (CLARITIN) 10 MG tablet Take 10 mg by mouth daily.  Marland Kitchen losartan-hydrochlorothiazide (HYZAAR) 100-25 MG per tablet Take 1 tablet by mouth daily.   . meloxicam (MOBIC) 7.5 MG tablet Take 7.5 mg by mouth daily.  . methocarbamol (ROBAXIN) 750 MG tablet Take 750 mg by mouth 3 (three) times daily.  . metoprolol succinate (TOPROL-XL) 25 MG 24 hr tablet TAKE 1 TABLET(25 MG) BY MOUTH DAILY  . montelukast (SINGULAIR) 10 MG tablet Take 10 mg by mouth daily.  . pravastatin (PRAVACHOL) 40 MG tablet Take 1 tablet (40 mg total) by mouth daily.  Marland Kitchen triamcinolone (NASACORT ALLERGY 24HR) 55 MCG/ACT AERO nasal inhaler Place 2 sprays into the nose daily.  . valACYclovir (VALTREX) 1000 MG tablet TAKE 1 TABLET BY MOUTH DAILY  . amLODipine (NORVASC) 10 MG tablet Take 1 tablet (10 mg total) by mouth daily for 30 days.  Marland Kitchen erythromycin ophthalmic ointment 0.5 inch in each eye four times daily for 5 days. (Patient not taking: Reported on 08/05/2019)  . gentamicin (GENTAK) 0.3 % ophthalmic ointment Place into both  eyes 2 (two) times daily. (Patient not taking: Reported on 08/05/2019)  . Glucosamine-Chondroit-Vit C-Mn (GLUCOSAMINE CHONDR 1500 COMPLX) CAPS Take by mouth.  Marland Kitchen PROAIR RESPICLICK 185 (90 Base) MCG/ACT AEPB Inhale 2 puffs into the lungs every 4 (four) hours as needed (shortness of breath).  (Patient not taking: Reported on 06/28/2020)   No facility-administered medications prior to visit.    Review of Systems  HENT: Negative for ear discharge, rhinorrhea and sore throat.   Respiratory: Negative for cough.   Musculoskeletal: Negative for neck pain.  Neurological: Positive for headaches.      Objective    BP 129/84   Pulse (!) 54   Temp 98.5 F (36.9 C)   Resp 16   Ht 5\' 9"  (1.753 m)   Wt 177 lb 12.8 oz (80.6 kg)   BMI 26.26 kg/m    Physical Exam Constitutional:      Appearance: Normal appearance.  HENT:     Right Ear: Tympanic membrane normal. There is impacted cerumen.     Left Ear: Tympanic membrane normal. There is no impacted cerumen.     Ears:     Comments: There is some soft wax in right ear but canal is patent.  Cardiovascular:     Rate and Rhythm: Normal rate.  Pulmonary:     Effort: Pulmonary effort is normal. No respiratory distress.  Neurological:     Mental Status: He  is alert and oriented to person, place, and time. Mental status is at baseline.  Psychiatric:        Mood and Affect: Mood normal.        Behavior: Behavior normal.       No results found for any visits on 09/29/20.  Assessment & Plan    1. Impacted cerumen of right ear  Resolved with ear lavage. Patient reports tinnitus worsening - offered ENT referral, patient defers at this time. Patient will return for CPE in one week.    Return in about 1 week (around 10/06/2020) for CPE.      ITrinna Post, PA-C, have reviewed all documentation for this visit. The documentation on 09/29/20 for the exam, diagnosis, procedures, and orders are all accurate and complete.  The entirety of the  information documented in the History of Present Illness, Review of Systems and Physical Exam were personally obtained by me. Portions of this information were initially documented by Wilburt Finlay, CMA and reviewed by me for thoroughness and accuracy.   I spent 20 minutes dedicated to the care of this patient on the date of this encounter to include pre-visit review of records, face-to-face time with the patient discussing earwax, and post visit ordering of testing.   Paulene Floor  [facility] [phone] (phone) [phone] (fax)  [place]

## 2020-10-04 DIAGNOSIS — M5441 Lumbago with sciatica, right side: Secondary | ICD-10-CM | POA: Diagnosis not present

## 2020-10-04 DIAGNOSIS — M5416 Radiculopathy, lumbar region: Secondary | ICD-10-CM | POA: Diagnosis not present

## 2020-10-04 DIAGNOSIS — M545 Low back pain, unspecified: Secondary | ICD-10-CM | POA: Diagnosis not present

## 2020-10-04 DIAGNOSIS — M533 Sacrococcygeal disorders, not elsewhere classified: Secondary | ICD-10-CM | POA: Diagnosis not present

## 2020-10-04 DIAGNOSIS — M5136 Other intervertebral disc degeneration, lumbar region: Secondary | ICD-10-CM | POA: Diagnosis not present

## 2020-10-04 DIAGNOSIS — G8929 Other chronic pain: Secondary | ICD-10-CM | POA: Diagnosis not present

## 2020-10-07 ENCOUNTER — Ambulatory Visit (INDEPENDENT_AMBULATORY_CARE_PROVIDER_SITE_OTHER): Payer: Federal, State, Local not specified - PPO

## 2020-10-07 ENCOUNTER — Other Ambulatory Visit: Payer: Self-pay

## 2020-10-07 DIAGNOSIS — Z23 Encounter for immunization: Secondary | ICD-10-CM

## 2020-10-08 ENCOUNTER — Encounter: Payer: Self-pay | Admitting: Physician Assistant

## 2020-10-08 ENCOUNTER — Ambulatory Visit (INDEPENDENT_AMBULATORY_CARE_PROVIDER_SITE_OTHER): Payer: Federal, State, Local not specified - PPO | Admitting: Physician Assistant

## 2020-10-08 ENCOUNTER — Other Ambulatory Visit: Payer: Self-pay

## 2020-10-08 VITALS — BP 119/82 | HR 65 | Temp 99.0°F | Resp 16 | Ht 69.0 in | Wt 177.0 lb

## 2020-10-08 DIAGNOSIS — Z1211 Encounter for screening for malignant neoplasm of colon: Secondary | ICD-10-CM

## 2020-10-08 DIAGNOSIS — Z0000 Encounter for general adult medical examination without abnormal findings: Secondary | ICD-10-CM

## 2020-10-08 NOTE — Patient Instructions (Signed)

## 2020-10-08 NOTE — Progress Notes (Signed)
Complete physical exam   Patient: Terry Franco   DOB: Aug 19, 1961   59 y.o. Male  MRN: [id number] Visit Date: 10/08/2020  Today's healthcare provider: Trinna Post, PA-C   Chief Complaint  Patient presents with  . Annual Exam   Subjective    Terry Franco is a 59 y.o. male who presents today for a complete physical exam.  He reports consuming a general diet. The patient does not participate in regular exercise at present. He generally feels well. He reports sleeping fairly well. He does not have additional problems to discuss today.   Due for colonoscopy, last colonoscopy 2016 with recommend repeat in 5 years.   Due for PSA screening.   Past Medical History:  Diagnosis Date  . Allergy   . Anxiety   . Arthritis   . Atypical mole 02/17/2020   R epigastric/excision  . Depression   . GERD (gastroesophageal reflux disease)   . Herpes simplex infection    type 1  . Hyperlipidemia   . Hypertension    Past Surgical History:  Procedure Laterality Date  . HERNIA REPAIR     Social History   Socioeconomic History  . Marital status: Married    Spouse name: Not on file  . Number of children: Not on file  . Years of education: Not on file  . Highest education level: Not on file  Occupational History  . Not on file  Tobacco Use  . Smoking status: Never Smoker  . Smokeless tobacco: Never Used  Vaping Use  . Vaping Use: Never used  Substance and Sexual Activity  . Alcohol use: Yes    Alcohol/week: 0.0 standard drinks  . Drug use: No  . Sexual activity: Not on file  Other Topics Concern  . Not on file  Social History Narrative  . Not on file   Social Determinants of Health   Financial Resource Strain:   . Difficulty of Paying Living Expenses: Not on file  Food Insecurity:   . Worried About Charity fundraiser in the Last Year: Not on file  . Ran Out of Food in the Last Year: Not on file  Transportation Needs:   . Lack of Transportation (Medical):  Not on file  . Lack of Transportation (Non-Medical): Not on file  Physical Activity:   . Days of Exercise per Week: Not on file  . Minutes of Exercise per Session: Not on file  Stress:   . Feeling of Stress : Not on file  Social Connections:   . Frequency of Communication with Friends and Family: Not on file  . Frequency of Social Gatherings with Friends and Family: Not on file  . Attends Religious Services: Not on file  . Active Member of Clubs or Organizations: Not on file  . Attends Archivist Meetings: Not on file  . Marital Status: Not on file  Intimate Partner Violence:   . Fear of Current or Ex-Partner: Not on file  . Emotionally Abused: Not on file  . Physically Abused: Not on file  . Sexually Abused: Not on file   Family Status  Relation Name Status  . Mother  Deceased  . Father  Deceased  . Annamarie Major  (Not Specified)  . Brother  (Not Specified)  . Neg Hx  (Not Specified)   Family History  Problem Relation Age of Onset  . Cancer Mother        melanoma  . Cancer Father  lung cancer  . Heart disease Father   . Benign prostatic hyperplasia Father   . Prostate cancer Paternal Uncle   . Benign prostatic hyperplasia Brother   . Urolithiasis Brother   . Kidney cancer Neg Hx   . Kidney disease Neg Hx    Allergies  Allergen Reactions  . Ibuprofen Swelling  . Versed [Midazolam] Nausea And Vomiting  . Penicillin V Potassium Rash and Other (See Comments)    Has patient had a PCN reaction causing immediate rash, facial/tongue/throat swelling, SOB or lightheadedness with hypotension: unknown Has patient had a PCN reaction causing severe rash involving mucus membranes or skin necrosis: unknown Has patient had a PCN reaction that required hospitalization No Has patient had a PCN reaction occurring within the last 10 years: No If all of the above answers are "NO", then may proceed with Cephalosporin use.     Patient Care Team: Paulene Floor as  PCP - General (Physician Assistant)   Medications: Outpatient Medications Prior to Visit  Medication Sig  . loratadine (CLARITIN) 10 MG tablet Take 10 mg by mouth daily.  Marland Kitchen losartan-hydrochlorothiazide (HYZAAR) 100-25 MG per tablet Take 1 tablet by mouth daily.   . meloxicam (MOBIC) 7.5 MG tablet Take 7.5 mg by mouth daily.  . methocarbamol (ROBAXIN) 750 MG tablet Take 750 mg by mouth 3 (three) times daily.  . metoprolol succinate (TOPROL-XL) 25 MG 24 hr tablet TAKE 1 TABLET(25 MG) BY MOUTH DAILY  . pravastatin (PRAVACHOL) 40 MG tablet Take 1 tablet (40 mg total) by mouth daily.  Marland Kitchen triamcinolone (NASACORT ALLERGY 24HR) 55 MCG/ACT AERO nasal inhaler Place 2 sprays into the nose daily.  . valACYclovir (VALTREX) 1000 MG tablet TAKE 1 TABLET BY MOUTH DAILY  . amLODipine (NORVASC) 10 MG tablet Take 1 tablet (10 mg total) by mouth daily for 30 days.  Marland Kitchen erythromycin ophthalmic ointment 0.5 inch in each eye four times daily for 5 days. (Patient not taking: Reported on 08/05/2019)  . gentamicin (GENTAK) 0.3 % ophthalmic ointment Place into both eyes 2 (two) times daily. (Patient not taking: Reported on 08/05/2019)  . Glucosamine-Chondroit-Vit C-Mn (GLUCOSAMINE CHONDR 1500 COMPLX) CAPS Take by mouth.  . montelukast (SINGULAIR) 10 MG tablet Take 10 mg by mouth daily. (Patient not taking: Reported on 10/08/2020)  . PROAIR RESPICLICK 517 (90 Base) MCG/ACT AEPB Inhale 2 puffs into the lungs every 4 (four) hours as needed (shortness of breath).  (Patient not taking: Reported on 06/28/2020)   No facility-administered medications prior to visit.    Review of Systems  Constitutional: Negative.   HENT: Negative.   Eyes: Negative.   Respiratory: Negative.   Cardiovascular: Negative.   Gastrointestinal: Negative.   Endocrine: Negative.   Genitourinary: Negative.   Musculoskeletal: Negative.   Skin: Negative.   Allergic/Immunologic: Negative.   Neurological: Negative.   Hematological: Negative.     Psychiatric/Behavioral: Negative.       Objective    BP 119/82   Pulse 65   Temp 99 F (37.2 C)   Resp 16   Ht 5\' 9"  (1.753 m)   Wt 177 lb (80.3 kg)   BMI 26.14 kg/m    Physical Exam Constitutional:      Appearance: Normal appearance.  HENT:     Right Ear: Tympanic membrane normal.     Left Ear: Tympanic membrane normal.  Eyes:     Pupils: Pupils are equal, round, and reactive to light.  Cardiovascular:     Rate and Rhythm: Normal rate  and regular rhythm.  Pulmonary:     Effort: Pulmonary effort is normal.     Breath sounds: Normal breath sounds.  Abdominal:     General: Bowel sounds are normal.  Musculoskeletal:        General: Normal range of motion.  Skin:    General: Skin is warm and dry.  Neurological:     Mental Status: He is alert and oriented to person, place, and time. Mental status is at baseline.  Psychiatric:        Mood and Affect: Mood normal.        Behavior: Behavior normal.       Last depression screening scores PHQ 2/9 Scores 10/08/2020 08/05/2019 06/25/2017  PHQ - 2 Score 1 - 0  PHQ- 9 Score - - 0  Exception Documentation - Patient refusal -   Last fall risk screening Fall Risk  10/08/2020  Falls in the past year? 0  Number falls in past yr: 0  Injury with Fall? 0  Risk for fall due to : No Fall Risks  Follow up Falls evaluation completed   Last Audit-C alcohol use screening Alcohol Use Disorder Test (AUDIT) 10/08/2020  1. How often do you have a drink containing alcohol? 2  2. How many drinks containing alcohol do you have on a typical day when you are drinking? 0  3. How often do you have six or more drinks on one occasion? 1  AUDIT-C Score 3  4. How often during the last year have you found that you were not able to stop drinking once you had started? 0  5. How often during the last year have you failed to do what was normally expected from you because of drinking? 0  6. How often during the last year have you needed a first  drink in the morning to get yourself going after a heavy drinking session? 0  7. How often during the last year have you had a feeling of guilt of remorse after drinking? 0  8. How often during the last year have you been unable to remember what happened the night before because you had been drinking? 0  9. Have you or someone else been injured as a result of your drinking? 0  10. Has a relative or friend or a doctor or another health worker been concerned about your drinking or suggested you cut down? 0  Alcohol Use Disorder Identification Test Final Score (AUDIT) 3   A score of 3 or more in women, and 4 or more in men indicates increased risk for alcohol abuse, EXCEPT if all of the points are from question 1   No results found for any visits on 10/08/20.  Assessment & Plan    Routine Health Maintenance and Physical Exam  Exercise Activities and Dietary recommendations Goals   None     Immunization History  Administered Date(s) Administered  . Influenza Split 10/17/2016  . Influenza,inj,Quad PF,6+ Mos 09/24/2019  . Influenza-Unspecified 09/23/2017  . PFIZER SARS-COV-2 Vaccination 03/06/2020, 03/27/2020, 10/07/2020  . Pneumococcal Conjugate-13 10/17/2016  . Tdap 12/22/2017    Health Maintenance  Topic Date Due  . Hepatitis C Screening  Never done  . HIV Screening  Never done  . COLONOSCOPY  Never done  . INFLUENZA VACCINE  03/17/2021 (Originally 07/18/2020)  . TETANUS/TDAP  12/23/2027  . COVID-19 Vaccine  Completed    Discussed health benefits of physical activity, and encouraged him to engage in regular exercise appropriate for his age  and condition.  1. Annual physical exam  - HIV Antibody (routine testing w rflx) - TSH - Lipid panel - Comprehensive metabolic panel - CBC with Differential/Platelet - Hepatitis C Antibody - PSA  2. Colon cancer screening  - Ambulatory referral to Gastroenterology    Return in about 1 year (around 10/08/2021) for CPE.     ITrinna Post, PA-C, have reviewed all documentation for this visit. The documentation on 10/08/20 for the exam, diagnosis, procedures, and orders are all accurate and complete.  The entirety of the information documented in the History of Present Illness, Review of Systems and Physical Exam were personally obtained by me. Portions of this information were initially documented by [facility] and reviewed by me for thoroughness and accuracy.     Paulene Floor  [facility] [phone] (phone) [phone] (fax)  [place]

## 2020-10-09 LAB — COMPREHENSIVE METABOLIC PANEL
ALT: 33 IU/L (ref 0–44)
AST: 26 IU/L (ref 0–40)
Albumin/Globulin Ratio: 1.9 (ref 1.2–2.2)
Albumin: 4.4 g/dL (ref 3.8–4.9)
Alkaline Phosphatase: 67 IU/L (ref 44–121)
BUN/Creatinine Ratio: 11 (ref 10–24)
BUN: 11 mg/dL (ref 8–27)
Bilirubin Total: 0.7 mg/dL (ref 0.0–1.2)
CO2: 23 mmol/L (ref 20–29)
Calcium: 9.5 mg/dL (ref 8.6–10.2)
Chloride: 101 mmol/L (ref 96–106)
Creatinine, Ser: 1.01 mg/dL (ref 0.76–1.27)
GFR calc Af Amer: 93 mL/min/{1.73_m2} (ref >59)
GFR calc non Af Amer: 80 mL/min/{1.73_m2} (ref >59)
Globulin, Total: 2.3 g/dL (ref 1.5–4.5)
Glucose: 93 mg/dL (ref 65–99)
Potassium: 4.1 mmol/L (ref 3.5–5.2)
Sodium: 140 mmol/L (ref 134–144)
Total Protein: 6.7 g/dL (ref 6.0–8.5)

## 2020-10-09 LAB — CBC WITH DIFFERENTIAL/PLATELET
Basophils Absolute: 0.1 10*3/uL (ref 0.0–0.2)
Basos: 1 %
EOS (ABSOLUTE): 0.1 10*3/uL (ref 0.0–0.4)
Eos: 2 %
Hematocrit: 44 % (ref 37.5–51.0)
Hemoglobin: 15.5 g/dL (ref 13.0–17.7)
Immature Grans (Abs): 0 10*3/uL (ref 0.0–0.1)
Immature Granulocytes: 0 %
Lymphocytes Absolute: 1.6 10*3/uL (ref 0.7–3.1)
Lymphs: 21 %
MCH: 32.9 pg (ref 26.6–33.0)
MCHC: 35.2 g/dL (ref 31.5–35.7)
MCV: 93 fL (ref 79–97)
Monocytes Absolute: 0.8 10*3/uL (ref 0.1–0.9)
Monocytes: 11 %
Neutrophils Absolute: 5 10*3/uL (ref 1.4–7.0)
Neutrophils: 65 %
Platelets: 277 10*3/uL (ref 150–450)
RBC: 4.71 x10E6/uL (ref 4.14–5.80)
RDW: 13.2 % (ref 11.6–15.4)
WBC: 7.6 10*3/uL (ref 3.4–10.8)

## 2020-10-09 LAB — HIV ANTIBODY (ROUTINE TESTING W REFLEX): HIV Screen 4th Generation wRfx: NONREACTIVE

## 2020-10-09 LAB — LIPID PANEL
Chol/HDL Ratio: 5.5 ratio — ABNORMAL HIGH (ref 0.0–5.0)
Cholesterol, Total: 204 mg/dL — ABNORMAL HIGH (ref 100–199)
HDL: 37 mg/dL — ABNORMAL LOW (ref >39)
LDL Chol Calc (NIH): 135 mg/dL — ABNORMAL HIGH (ref 0–99)
Triglycerides: 180 mg/dL — ABNORMAL HIGH (ref 0–149)
VLDL Cholesterol Cal: 32 mg/dL (ref 5–40)

## 2020-10-09 LAB — PSA: Prostate Specific Ag, Serum: 1.4 ng/mL (ref 0.0–4.0)

## 2020-10-09 LAB — HEPATITIS C ANTIBODY: Hep C Virus Ab: <0.1 s/co ratio (ref 0.0–0.9)

## 2020-10-09 LAB — TSH: TSH: 2.14 u[IU]/mL (ref 0.450–4.500)

## 2020-10-13 ENCOUNTER — Ambulatory Visit (INDEPENDENT_AMBULATORY_CARE_PROVIDER_SITE_OTHER): Payer: Federal, State, Local not specified - PPO | Admitting: Psychology

## 2020-10-13 DIAGNOSIS — M533 Sacrococcygeal disorders, not elsewhere classified: Secondary | ICD-10-CM | POA: Diagnosis not present

## 2020-10-13 DIAGNOSIS — F4323 Adjustment disorder with mixed anxiety and depressed mood: Secondary | ICD-10-CM | POA: Diagnosis not present

## 2020-10-15 ENCOUNTER — Encounter: Payer: Self-pay | Admitting: Physician Assistant

## 2020-10-15 DIAGNOSIS — M461 Sacroiliitis, not elsewhere classified: Secondary | ICD-10-CM | POA: Diagnosis not present

## 2020-10-20 ENCOUNTER — Telehealth: Payer: Self-pay

## 2020-10-20 ENCOUNTER — Telehealth (INDEPENDENT_AMBULATORY_CARE_PROVIDER_SITE_OTHER): Payer: Self-pay | Admitting: Gastroenterology

## 2020-10-20 ENCOUNTER — Other Ambulatory Visit: Payer: Self-pay

## 2020-10-20 DIAGNOSIS — Z1211 Encounter for screening for malignant neoplasm of colon: Secondary | ICD-10-CM

## 2020-10-20 MED ORDER — GOLYTELY 236 G PO SOLR: 4000.0000 mL | Freq: Once | ORAL | 0 refills | Status: AC

## 2020-10-20 NOTE — Progress Notes (Signed)
Gastroenterology Pre-Procedure Review  Request Date: Monday 11/22/20 Requesting Physician: Dr. Allen Norris  PATIENT REVIEW QUESTIONS: The patient responded to the following health history questions as indicated:    1. Are you having any GI issues? no 2. Do you have a personal history of Polyps? no 3. Do you have a family history of Colon Cancer or Polyps? yes (brother had colon polyps) 4. Diabetes Mellitus? no 5. Joint replacements in the past 12 months?no 6. Major health problems in the past 3 months?no 7. Any artificial heart valves, MVP, or defibrillator?no    MEDICATIONS & ALLERGIES:    Patient reports the following regarding taking any anticoagulation/antiplatelet therapy:   Plavix, Coumadin, Eliquis, Xarelto, Lovenox, Pradaxa, Brilinta, or Effient? no Aspirin? no  Patient confirms/reports the following medications:  Current Outpatient Medications  Medication Sig Dispense Refill  . loratadine (CLARITIN) 10 MG tablet Take 10 mg by mouth daily.    Marland Kitchen losartan-hydrochlorothiazide (HYZAAR) 100-25 MG per tablet Take 1 tablet by mouth daily.     . meloxicam (MOBIC) 7.5 MG tablet Take 7.5 mg by mouth daily.    . methocarbamol (ROBAXIN) 750 MG tablet Take 750 mg by mouth 3 (three) times daily.    . metoprolol succinate (TOPROL-XL) 25 MG 24 hr tablet TAKE 1 TABLET(25 MG) BY MOUTH DAILY 90 tablet 1  . pravastatin (PRAVACHOL) 40 MG tablet Take 1 tablet (40 mg total) by mouth daily. 90 tablet 0  . triamcinolone (NASACORT ALLERGY 24HR) 55 MCG/ACT AERO nasal inhaler Place 2 sprays into the nose daily.    . valACYclovir (VALTREX) 1000 MG tablet TAKE 1 TABLET BY MOUTH DAILY 90 tablet 0  . amLODipine (NORVASC) 10 MG tablet Take 1 tablet (10 mg total) by mouth daily for 30 days. 30 tablet 0  . montelukast (SINGULAIR) 10 MG tablet Take 10 mg by mouth daily. (Patient not taking: Reported on 10/08/2020)  0  . polyethylene glycol (GOLYTELY) 236 g solution Take 4,000 mLs by mouth once for 1 dose. 4000 mL 0   . PROAIR RESPICLICK 073 (90 Base) MCG/ACT AEPB Inhale 2 puffs into the lungs every 4 (four) hours as needed (shortness of breath).  (Patient not taking: Reported on 06/28/2020)  0   No current facility-administered medications for this visit.    Patient confirms/reports the following allergies:  Allergies  Allergen Reactions  . Ibuprofen Swelling  . Versed [Midazolam] Nausea And Vomiting  . Penicillin V Potassium Rash and Other (See Comments)    Has patient had a PCN reaction causing immediate rash, facial/tongue/throat swelling, SOB or lightheadedness with hypotension: unknown Has patient had a PCN reaction causing severe rash involving mucus membranes or skin necrosis: unknown Has patient had a PCN reaction that required hospitalization No Has patient had a PCN reaction occurring within the last 10 years: No If all of the above answers are "NO", then may proceed with Cephalosporin use.     Orders Placed This Encounter  Procedures  . Procedural/ Surgical Case Request: COLONOSCOPY WITH PROPOFOL    Standing Status:   Standing    Number of Occurrences:   1    Order Specific Question:   Pre-op diagnosis    Answer:   screening colonoscopy    Order Specific Question:   CPT Code    Answer:   71062    AUTHORIZATION INFORMATION Primary Insurance: 1D#: Group #:  Secondary Insurance: 1D#: Group #:  SCHEDULE INFORMATION: Date: Monday 11/22/20 Time: Location:[facility]

## 2020-10-20 NOTE — Telephone Encounter (Signed)
Contacted patient this morning to triage and schedule his colonoscopy.  I was unable to get in touch with him.  LVM for him to call the office to reschedule his triage call.  Thanks,  [place], [place]

## 2020-10-25 DIAGNOSIS — M533 Sacrococcygeal disorders, not elsewhere classified: Secondary | ICD-10-CM | POA: Diagnosis not present

## 2020-10-27 ENCOUNTER — Ambulatory Visit: Payer: Federal, State, Local not specified - PPO | Admitting: Psychology

## 2020-10-27 DIAGNOSIS — M533 Sacrococcygeal disorders, not elsewhere classified: Secondary | ICD-10-CM | POA: Diagnosis not present

## 2020-11-01 DIAGNOSIS — M533 Sacrococcygeal disorders, not elsewhere classified: Secondary | ICD-10-CM | POA: Diagnosis not present

## 2020-11-08 DIAGNOSIS — M5441 Lumbago with sciatica, right side: Secondary | ICD-10-CM | POA: Diagnosis not present

## 2020-11-08 DIAGNOSIS — M5416 Radiculopathy, lumbar region: Secondary | ICD-10-CM | POA: Diagnosis not present

## 2020-11-08 DIAGNOSIS — M5136 Other intervertebral disc degeneration, lumbar region: Secondary | ICD-10-CM | POA: Diagnosis not present

## 2020-11-08 DIAGNOSIS — M461 Sacroiliitis, not elsewhere classified: Secondary | ICD-10-CM | POA: Diagnosis not present

## 2020-11-09 ENCOUNTER — Ambulatory Visit (INDEPENDENT_AMBULATORY_CARE_PROVIDER_SITE_OTHER): Payer: Federal, State, Local not specified - PPO | Admitting: Psychology

## 2020-11-09 DIAGNOSIS — F4323 Adjustment disorder with mixed anxiety and depressed mood: Secondary | ICD-10-CM | POA: Diagnosis not present

## 2020-11-10 DIAGNOSIS — M533 Sacrococcygeal disorders, not elsewhere classified: Secondary | ICD-10-CM | POA: Diagnosis not present

## 2020-11-15 ENCOUNTER — Telehealth: Payer: Self-pay

## 2020-11-15 ENCOUNTER — Other Ambulatory Visit: Payer: Self-pay

## 2020-11-15 DIAGNOSIS — M533 Sacrococcygeal disorders, not elsewhere classified: Secondary | ICD-10-CM | POA: Diagnosis not present

## 2020-11-15 NOTE — Telephone Encounter (Signed)
Returned patients call. Patient requested to reschedule his procedure.

## 2020-11-15 NOTE — Progress Notes (Signed)
New instructions have been sent via my chart and mailed to home address.

## 2020-11-17 DIAGNOSIS — M533 Sacrococcygeal disorders, not elsewhere classified: Secondary | ICD-10-CM | POA: Diagnosis not present

## 2020-11-24 DIAGNOSIS — M533 Sacrococcygeal disorders, not elsewhere classified: Secondary | ICD-10-CM | POA: Diagnosis not present

## 2020-12-01 DIAGNOSIS — M533 Sacrococcygeal disorders, not elsewhere classified: Secondary | ICD-10-CM | POA: Diagnosis not present

## 2020-12-08 DIAGNOSIS — M533 Sacrococcygeal disorders, not elsewhere classified: Secondary | ICD-10-CM | POA: Diagnosis not present

## 2020-12-14 ENCOUNTER — Other Ambulatory Visit: Payer: Self-pay | Admitting: Physician Assistant

## 2020-12-14 DIAGNOSIS — K120 Recurrent oral aphthae: Secondary | ICD-10-CM

## 2020-12-27 ENCOUNTER — Encounter: Payer: Federal, State, Local not specified - PPO | Admitting: Dermatology

## 2021-01-05 ENCOUNTER — Encounter: Payer: Federal, State, Local not specified - PPO | Admitting: Dermatology

## 2021-01-06 ENCOUNTER — Other Ambulatory Visit: Payer: Federal, State, Local not specified - PPO | Attending: Gastroenterology

## 2021-01-10 ENCOUNTER — Encounter: Admission: RE | Payer: Self-pay | Source: Home / Self Care

## 2021-01-10 ENCOUNTER — Ambulatory Visit
Admission: RE | Admit: 2021-01-10 | Payer: Federal, State, Local not specified - PPO | Source: Home / Self Care | Admitting: Gastroenterology

## 2021-01-10 SURGERY — COLONOSCOPY WITH PROPOFOL
Anesthesia: General

## 2021-02-17 ENCOUNTER — Telehealth: Payer: Self-pay | Admitting: Physician Assistant

## 2021-02-17 DIAGNOSIS — K120 Recurrent oral aphthae: Secondary | ICD-10-CM

## 2021-02-17 NOTE — Telephone Encounter (Signed)
[facility] faxed refill request for the following medications:  valACYclovir (VALTREX) 1000 MG tablet  Last Rx: 12/14/20 LOV: 10/08/20 Please advise. Thanks TNP

## 2021-02-18 MED ORDER — VALACYCLOVIR HCL 1 G PO TABS: 1000.0000 mg | ORAL_TABLET | Freq: Every day | ORAL | 0 refills | Status: DC

## 2021-02-18 NOTE — Telephone Encounter (Signed)
Refilled

## 2021-02-21 MED ORDER — VALACYCLOVIR HCL 1 G PO TABS: 1000.0000 mg | ORAL_TABLET | Freq: Every day | ORAL | 0 refills | Status: DC

## 2021-02-21 NOTE — Telephone Encounter (Signed)
Medication request for Valtrex was send to [facility] per patients request. Patient was notified via vm that medication was send into pharmacy as well.

## 2021-02-21 NOTE — Telephone Encounter (Signed)
This was supposed to be sent to [facility].  Can we send to the correct pharmacy?

## 2021-03-23 ENCOUNTER — Ambulatory Visit (INDEPENDENT_AMBULATORY_CARE_PROVIDER_SITE_OTHER): Payer: Federal, State, Local not specified - PPO

## 2021-03-23 ENCOUNTER — Other Ambulatory Visit: Payer: Self-pay

## 2021-03-23 DIAGNOSIS — Z23 Encounter for immunization: Secondary | ICD-10-CM

## 2021-04-26 ENCOUNTER — Telehealth: Payer: Self-pay

## 2021-04-26 NOTE — Telephone Encounter (Signed)
Copied from [place] [phone]. Topic: General - Other >> Apr 26, 2021 12:55 PM Tessa Lerner A wrote: Reason for CRM: Patient is requesting to have their care transferred to either Dr. Caryn Section or Dr. Rosanna Randy  Patient would like to continue being seen by the practice  Patient stressed that they are currently experiencing no medical issues  Please contact to further advise if needed

## 2021-04-27 NOTE — Telephone Encounter (Signed)
I'm not taking new permanent patients, but can see him until we get a new physician.

## 2021-04-28 NOTE — Telephone Encounter (Signed)
Patient informed as below.  He does want to be assigned to the new Doc when they arrive at [facility]

## 2021-05-17 ENCOUNTER — Other Ambulatory Visit: Payer: Self-pay

## 2021-05-17 DIAGNOSIS — K120 Recurrent oral aphthae: Secondary | ICD-10-CM

## 2021-05-17 MED ORDER — VALACYCLOVIR HCL 1 G PO TABS
1000.0000 mg | ORAL_TABLET | Freq: Every day | ORAL | 0 refills | Status: DC
Start: 2021-05-17 — End: 2021-08-15

## 2021-05-17 NOTE — Telephone Encounter (Signed)
[facility] faxed refill request for the following medications:  valACYclovir (VALTREX) 1000 MG tablet   Please advise.

## 2021-05-26 ENCOUNTER — Other Ambulatory Visit: Payer: Self-pay

## 2021-05-26 ENCOUNTER — Ambulatory Visit: Payer: Federal, State, Local not specified - PPO | Admitting: Dermatology

## 2021-05-26 DIAGNOSIS — D2262 Melanocytic nevi of left upper limb, including shoulder: Secondary | ICD-10-CM | POA: Diagnosis not present

## 2021-05-26 DIAGNOSIS — L820 Inflamed seborrheic keratosis: Secondary | ICD-10-CM | POA: Diagnosis not present

## 2021-05-26 DIAGNOSIS — D1800 Hemangioma unspecified site: Secondary | ICD-10-CM

## 2021-05-26 DIAGNOSIS — D485 Neoplasm of uncertain behavior of skin: Secondary | ICD-10-CM

## 2021-05-26 DIAGNOSIS — D239 Other benign neoplasm of skin, unspecified: Secondary | ICD-10-CM

## 2021-05-26 DIAGNOSIS — D229 Melanocytic nevi, unspecified: Secondary | ICD-10-CM

## 2021-05-26 DIAGNOSIS — Z1283 Encounter for screening for malignant neoplasm of skin: Secondary | ICD-10-CM | POA: Diagnosis not present

## 2021-05-26 DIAGNOSIS — L814 Other melanin hyperpigmentation: Secondary | ICD-10-CM | POA: Diagnosis not present

## 2021-05-26 DIAGNOSIS — L578 Other skin changes due to chronic exposure to nonionizing radiation: Secondary | ICD-10-CM

## 2021-05-26 DIAGNOSIS — L821 Other seborrheic keratosis: Secondary | ICD-10-CM

## 2021-05-26 DIAGNOSIS — Z86018 Personal history of other benign neoplasm: Secondary | ICD-10-CM | POA: Diagnosis not present

## 2021-05-26 HISTORY — DX: Other benign neoplasm of skin, unspecified: D23.9

## 2021-05-26 NOTE — Progress Notes (Signed)
Follow-Up Visit   Subjective  Terry Franco is a 60 y.o. male who presents for the following: Annual Exam (Hx dysplastic nevus ). The patient presents for Total-Body Skin Exam (TBSE) for skin cancer screening and mole check.  The following portions of the chart were reviewed this encounter and updated as appropriate:   Tobacco  Allergies  Meds  Problems  Med Hx  Surg Hx  Fam Hx      Review of Systems:  No other skin or systemic complaints except as noted in HPI or Assessment and Plan.  Objective  Well appearing patient in no apparent distress; mood and affect are within normal limits.  A full examination was performed including scalp, head, eyes, ears, nose, lips, neck, chest, axillae, abdomen, back, buttocks, bilateral upper extremities, bilateral lower extremities, hands, feet, fingers, toes, fingernails, and toenails. All findings within normal limits unless otherwise noted below.  L prox tricep of the lat axillary fold 0.6 cm irregular brown macule  Face x 2 - total of 2 (2) Erythematous keratotic or waxy stuck-on papule or plaque.   Assessment & Plan  Neoplasm of uncertain behavior of skin L prox tricep of the lat axillary fold  Epidermal / dermal shaving  Lesion diameter (cm):  0.6 Informed consent: discussed and consent obtained   Timeout: patient name, date of birth, surgical site, and procedure verified   Procedure prep:  Patient was prepped and draped in usual sterile fashion Prep type:  Isopropyl alcohol Anesthesia: the lesion was anesthetized in a standard fashion   Anesthetic:  1% lidocaine w/ epinephrine 1-100,000 buffered w/ 8.4% NaHCO3 Instrument used: flexible razor blade   Hemostasis achieved with: pressure, aluminum chloride and electrodesiccation   Outcome: patient tolerated procedure well   Post-procedure details: sterile dressing applied and wound care instructions given   Dressing type: bandage and petrolatum    Specimen 1 - Surgical  pathology Differential Diagnosis: D48.5 r/o dysplastic nevus  Check Margins: No 0.6 cm irregular brown macule   Inflamed seborrheic keratosis Face x 2 - total of 2  Destruction of lesion - Face x 2 - total of 2 Complexity: simple   Destruction method: cryotherapy   Informed consent: discussed and consent obtained   Timeout:  patient name, date of birth, surgical site, and procedure verified Lesion destroyed using liquid nitrogen: Yes   Region frozen until ice ball extended beyond lesion: Yes   Outcome: patient tolerated procedure well with no complications   Post-procedure details: wound care instructions given    Skin cancer screening  Lentigines - Scattered tan macules - Due to sun exposure - Benign-appering, observe - Recommend daily broad spectrum sunscreen SPF 30+ to sun-exposed areas, reapply every 2 hours as needed. - Call for any changes  Seborrheic Keratoses - Stuck-on, waxy, tan-brown papules and/or plaques  - Benign-appearing - Discussed benign etiology and prognosis. - Observe - Call for any changes  Melanocytic Nevi - Tan-brown and/or pink-flesh-colored symmetric macules and papules - Benign appearing on exam today - Observation - Call clinic for new or changing moles - Recommend daily use of broad spectrum spf 30+ sunscreen to sun-exposed areas.   Hemangiomas - Red papules - Discussed benign nature - Observe - Call for any changes  Actinic Damage - Chronic condition, secondary to cumulative UV/sun exposure - diffuse scaly erythematous macules with underlying dyspigmentation - Recommend daily broad spectrum sunscreen SPF 30+ to sun-exposed areas, reapply every 2 hours as needed.  - Staying in the shade or wearing long  sleeves, sun glasses (UVA+UVB protection) and wide brim hats (4-inch brim around the entire circumference of the hat) are also recommended for sun protection.  - Call for new or changing lesions.  History of Dysplastic Nevus - No  evidence of recurrence today - Recommend regular full body skin exams - Recommend daily broad spectrum sunscreen SPF 30+ to sun-exposed areas, reapply every 2 hours as needed.  - Call if any new or changing lesions are noted between office visits  Skin cancer screening performed today.  Return in about 1 year (around 05/26/2022) for TBSE.  Luther Redo, CMA, am acting as scribe for Sarina Ser, MD .  Documentation: I have reviewed the above documentation for accuracy and completeness, and I agree with the above.  Sarina Ser, MD

## 2021-05-26 NOTE — Patient Instructions (Addendum)
If you have any questions or concerns for your doctor, please call our main line at [phone] and press option 4 to reach your doctor's medical assistant. If no one answers, please leave a voicemail as directed and we will return your call as soon as possible. Messages left after 4 pm will be answered the following business day.   You may also send us a message via MyChart. We typically respond to MyChart messages within 1-2 business days.  For prescription refills, please ask your pharmacy to contact our office. Our fax number is [phone].  If you have an urgent issue when the clinic is closed that cannot wait until the next business day, you can page your doctor at the number below.    Please note that while we do our best to be available for urgent issues outside of office hours, we are not available 24/7.   If you have an urgent issue and are unable to reach us, you may choose to seek medical care at your doctor's office, retail clinic, urgent care center, or emergency room.  If you have a medical emergency, please immediately call 911 or go to the emergency department.  Pager Numbers  - Dr. Kowalski: [phone]  - Dr. Moye: [phone]  - Dr. Stewart: [phone]  In the event of inclement weather, please call our main line at [phone] for an update on the status of any delays or closures.  Dermatology Medication Tips: Please keep the boxes that topical medications come in in order to help keep track of the instructions about where and how to use these. Pharmacies typically print the medication instructions only on the boxes and not directly on the medication tubes.   If your medication is too expensive, please contact our office at [phone] option 4 or send us a message through MyChart.   We are unable to tell what your co-pay for medications will be in advance as this is different depending on your insurance coverage. However, we may be able to find a substitute  medication at lower cost or fill out paperwork to get insurance to cover a needed medication.   If a prior authorization is required to get your medication covered by your insurance company, please allow us 1-2 business days to complete this process.  Drug prices often vary depending on where the prescription is filled and some pharmacies may offer cheaper prices.  The website www.goodrx.com contains coupons for medications through different pharmacies. The prices here do not account for what the cost may be with help from insurance (it may be cheaper with your insurance), but the website can give you the price if you did not use any insurance.  - You can print the associated coupon and take it with your prescription to the pharmacy.  - You may also stop by our office during regular business hours and pick up a GoodRx coupon card.  - If you need your prescription sent electronically to a different pharmacy, notify our office through [facility] MyChart or by phone at [phone] option 4.   Wound Care Instructions  Cleanse wound gently with soap and water once a day then pat dry with clean gauze. Apply a thing coat of Petrolatum (petroleum jelly, "Vaseline") over the wound (unless you have an allergy to this). We recommend that you use a new, sterile tube of Vaseline. Do not pick or remove scabs. Do not remove the yellow or white "healing tissue" from the base of the wound.  Cover the   wound with fresh, clean, nonstick gauze and secure with paper tape. You may use Band-Aids in place of gauze and tape if the would is small enough, but would recommend trimming much of the tape off as there is often too much. Sometimes Band-Aids can irritate the skin.  You should call the office for your biopsy report after 1 week if you have not already been contacted.  If you experience any problems, such as abnormal amounts of bleeding, swelling, significant bruising, significant pain, or evidence of infection,  please call the office immediately.  FOR ADULT SURGERY PATIENTS: If you need something for pain relief you may take 1 extra strength Tylenol (acetaminophen) AND 2 Ibuprofen (200mg each) together every 4 hours as needed for pain. (do not take these if you are allergic to them or if you have a reason you should not take them.) Typically, you may only need pain medication for 1 to 3 days.    

## 2021-05-30 ENCOUNTER — Encounter: Payer: Self-pay | Admitting: Dermatology

## 2021-05-31 DIAGNOSIS — Z20822 Contact with and (suspected) exposure to covid-19: Secondary | ICD-10-CM | POA: Diagnosis not present

## 2021-06-01 ENCOUNTER — Telehealth: Payer: Self-pay

## 2021-06-01 NOTE — Telephone Encounter (Signed)
Advised patient of results and scheduled appointment 12/2021/hd

## 2021-06-01 NOTE — Telephone Encounter (Signed)
-----   Message from Ralene Bathe, MD sent at 06/01/2021 12:40 PM EDT ----- Diagnosis Skin , L prox tricep of the lat axillary fold DYSPLASTIC NEVUS WITH MODERATE TO SEVERE ATYPIA, SEE DESCRIPTION  Severe dysplastic Margins clear but close May need additional procedure Schedule appt for January 2023 for re-evaluation

## 2021-06-29 ENCOUNTER — Encounter: Payer: Federal, State, Local not specified - PPO | Admitting: Dermatology

## 2021-07-06 ENCOUNTER — Encounter: Payer: Self-pay | Admitting: Dermatology

## 2021-07-13 ENCOUNTER — Encounter: Payer: Self-pay | Admitting: Dermatology

## 2021-07-13 DIAGNOSIS — Z20822 Contact with and (suspected) exposure to covid-19: Secondary | ICD-10-CM | POA: Diagnosis not present

## 2021-07-21 ENCOUNTER — Telehealth: Payer: Federal, State, Local not specified - PPO | Admitting: Family Medicine

## 2021-07-21 ENCOUNTER — Encounter: Payer: Self-pay | Admitting: Family Medicine

## 2021-07-21 ENCOUNTER — Ambulatory Visit: Payer: Self-pay

## 2021-07-21 DIAGNOSIS — J40 Bronchitis, not specified as acute or chronic: Secondary | ICD-10-CM | POA: Diagnosis not present

## 2021-07-21 MED ORDER — BENZONATATE 200 MG PO CAPS: 200.0000 mg | ORAL_CAPSULE | Freq: Two times a day (BID) | ORAL | 0 refills | Status: DC | PRN

## 2021-07-21 MED ORDER — PREDNISONE 5 MG PO TABS: ORAL_TABLET | ORAL | 0 refills | Status: DC

## 2021-07-21 MED ORDER — DOXYCYCLINE HYCLATE 100 MG PO TABS: 100.0000 mg | ORAL_TABLET | Freq: Two times a day (BID) | ORAL | 0 refills | Status: DC

## 2021-07-21 NOTE — Progress Notes (Signed)
MyChart Video Visit    Virtual Visit via Video Note   This visit type was conducted due to national recommendations for restrictions regarding the COVID-19 Pandemic (e.g. social distancing) in an effort to limit this patient's exposure and mitigate transmission in our community. This patient is at least at moderate risk for complications without adequate follow up. This format is felt to be most appropriate for this patient at this time. Physical exam was limited by quality of the video and audio technology used for the visit.   Patient location: Home Provider location: Office  I discussed the limitations of evaluation and management by telemedicine and the availability of in person appointments. The patient expressed understanding and agreed to proceed.  Patient: Terry Franco   DOB: 04/01/1961   60 y.o. Male  MRN: [id number] Visit Date: 07/21/2021  Today's healthcare provider: Vernie Murders, PA-C   No chief complaint on file.  Subjective    Cough The problem has been unchanged. The cough is Productive of sputum. Associated symptoms include headaches and postnasal drip. Pertinent negatives include no chest pain, ear pain, fever, sore throat, shortness of breath or wheezing.   Pt. Reports he has had a cough x 2 weeks. Has been COVID 19 negative x 3. Productive cough with brown mucus. Robitussin, Mucinex not helping. No fever.  Past Medical History:  Diagnosis Date   Allergy    Anxiety    Arthritis    Atypical mole 02/17/2020   R epigastric/excision   Depression    Dysplastic nevus 05/26/2021   left prox tricep at the axillary fold - severe - margins free but close, recheck 12/2021   GERD (gastroesophageal reflux disease)    Herpes simplex infection    type 1   Hyperlipidemia    Hypertension    Social History   Tobacco Use   Smoking status: Never   Smokeless tobacco: Never  Vaping Use   Vaping Use: Never used  Substance Use Topics   Alcohol use: Yes     Alcohol/week: 0.0 standard drinks   Drug use: No   Allergies  Allergen Reactions   Ibuprofen Swelling   Versed [Midazolam] Nausea And Vomiting   Penicillin V Potassium Rash and Other (See Comments)    Has patient had a PCN reaction causing immediate rash, facial/tongue/throat swelling, SOB or lightheadedness with hypotension: unknown Has patient had a PCN reaction causing severe rash involving mucus membranes or skin necrosis: unknown Has patient had a PCN reaction that required hospitalization No Has patient had a PCN reaction occurring within the last 10 years: No If all of the above answers are "NO", then may proceed with Cephalosporin use.       Medications: Outpatient Medications Prior to Visit  Medication Sig   amLODipine (NORVASC) 10 MG tablet Take 1 tablet (10 mg total) by mouth daily for 30 days.   loratadine (CLARITIN) 10 MG tablet Take 10 mg by mouth daily.   losartan-hydrochlorothiazide (HYZAAR) 100-25 MG per tablet Take 1 tablet by mouth daily.    meloxicam (MOBIC) 7.5 MG tablet Take 7.5 mg by mouth daily.   methocarbamol (ROBAXIN) 750 MG tablet Take 750 mg by mouth 3 (three) times daily.   metoprolol succinate (TOPROL-XL) 25 MG 24 hr tablet TAKE 1 TABLET(25 MG) BY MOUTH DAILY   montelukast (SINGULAIR) 10 MG tablet Take 10 mg by mouth daily. (Patient not taking: No sig reported)   pravastatin (PRAVACHOL) 40 MG tablet Take 1 tablet (40 mg total) by mouth daily.  PROAIR RESPICLICK 123XX123 (90 Base) MCG/ACT AEPB Inhale 2 puffs into the lungs every 4 (four) hours as needed (shortness of breath).  (Patient not taking: Reported on 06/28/2020)   triamcinolone (NASACORT) 55 MCG/ACT AERO nasal inhaler Place 2 sprays into the nose daily.   valACYclovir (VALTREX) 1000 MG tablet Take 1 tablet (1,000 mg total) by mouth daily.   No facility-administered medications prior to visit.    Review of Systems  Constitutional:  Negative for fever.  HENT:  Positive for postnasal drip. Negative  for ear pain and sore throat.   Respiratory:  Positive for cough. Negative for shortness of breath and wheezing.   Cardiovascular:  Negative for chest pain.  Neurological:  Positive for headaches.     Objective    There were no vitals taken for this visit.   Physical Exam: WDWN male in no apparent distress.  Head: Normocephalic, atraumatic. Neck: Supple, NROM Respiratory: No apparent distress but having a hacking cough. Psych: Normal mood and affect   Assessment & Plan     1. Bronchitis Developed sore throat, rhinorrhea, cough, sneezing, headache, chills and muscle aches on 07-08-21. Started Robitussin, Zicam and Mucinex. No fever or loss of sense of taste and COVID tests negative 2 times in the past week. Only symptom remaining now is the cough with some clear sputum production. Family has been negative for COVID. Will treat with Doxycycline, Prednisone taper and Tessalon Perles. Increase fluid intake, rest and monitor for return of COVID-like symptoms. Recheck if no better in 3-4 days. - predniSONE (DELTASONE) 5 MG tablet; Taper down by 1 tablet by mouth daily starting at 6 day 1, then 5 day 2, 4 day 3, 3 day 4, 2 day 5 and 1 day 6. Divide dosage among meals and bedtime each day.  Dispense: 21 tablet; Refill: 0 - benzonatate (TESSALON) 200 MG capsule; Take 1 capsule (200 mg total) by mouth 2 (two) times daily as needed for cough.  Dispense: 20 capsule; Refill: 0 - doxycycline (VIBRA-TABS) 100 MG tablet; Take 1 tablet (100 mg total) by mouth 2 (two) times daily.  Dispense: 20 tablet; Refill: 0   No follow-ups on file.     I discussed the assessment and treatment plan with the patient. The patient was provided an opportunity to ask questions and all were answered. The patient agreed with the plan and demonstrated an understanding of the instructions.   The patient was advised to call back or seek an in-person evaluation if the symptoms worsen or if the condition fails to improve as  anticipated.  I provided 20 minutes of non-face-to-face time during this encounter.  I, Evelene Roussin, PA-C, have reviewed all documentation for this visit. The documentation on 07/21/21 for the exam, diagnosis, procedures, and orders are all accurate and complete.   Vernie Murders, PA-C [employer] [phone] (phone) [phone] (fax)  [place]

## 2021-07-21 NOTE — Telephone Encounter (Signed)
Patient shares that they have been experiencing a cough for almost two weeks   The patient has tested negative for COVID 3 times   The patient shares that the cough is particularly uncomfortable in the mornings   The patient would like to discuss medication options further  Reason for Disposition  [1] Continuous (nonstop) coughing interferes with work or school AND [2] no improvement using cough treatment per Care Advice  Answer Assessment - Initial Assessment Questions 1. ONSET: "When did the cough begin?"      2 weeks ago 2. SEVERITY: "How bad is the cough today?"      Moderate 3. SPUTUM: "Describe the color of your sputum" (none, dry cough; clear, white, yellow, green)     Sometimes brown 4. HEMOPTYSIS: "Are you coughing up any blood?" If so ask: "How much?" (flecks, streaks, tablespoons, etc.)     No 5. DIFFICULTY BREATHING: "Are you having difficulty breathing?" If Yes, ask: "How bad is it?" (e.g., mild, moderate, severe)    - MILD: No SOB at rest, mild SOB with walking, speaks normally in sentences, can lie down, no retractions, pulse < 100.    - MODERATE: SOB at rest, SOB with minimal exertion and prefers to sit, cannot lie down flat, speaks in phrases, mild retractions, audible wheezing, pulse 100-120.    - SEVERE: Very SOB at rest, speaks in single words, struggling to breathe, sitting hunched forward, retractions, pulse > 120      No 6. FEVER: "Do you have a fever?" If Yes, ask: "What is your temperature, how was it measured, and when did it start?"     No 7. CARDIAC HISTORY: "Do you have any history of heart disease?" (e.g., heart attack, congestive heart failure)      No 8. LUNG HISTORY: "Do you have any history of lung disease?"  (e.g., pulmonary embolus, asthma, emphysema)     No 9. PE RISK FACTORS: "Do you have a history of blood clots?" (or: recent major surgery, recent prolonged travel, bedridden)     No 10. OTHER SYMPTOMS: "Do you have any other symptoms?" (e.g.,  runny nose, wheezing, chest pain)       No 11. PREGNANCY: "Is there any chance you are pregnant?" "When was your last menstrual period?"       N/a 12. TRAVEL: "Have you traveled out of the country in the last month?" (e.g., travel history, exposures)       No  Protocols used: Cough - Acute Productive-A-AH

## 2021-07-21 NOTE — Telephone Encounter (Signed)
Pt. Reports he has had a cough x 2 weeks. Has been COVID 19 negative x 3. Productive cough with brown mucus. Robitussin, Mucinex not helping. No fever. Virtual visit made for today.

## 2021-07-26 ENCOUNTER — Ambulatory Visit: Payer: Self-pay | Admitting: *Deleted

## 2021-07-26 NOTE — Telephone Encounter (Signed)
Agree patient should go to an Urgent Care or the ED to rule out pneumonia or PE.

## 2021-07-26 NOTE — Telephone Encounter (Signed)
Reason for Disposition  Cough has been present for > 3 weeks    Greater than 2 weeks not 3  Answer Assessment - Initial Assessment Questions 1. ONSET: "When did the cough begin?"      July 22 2. SEVERITY: "How bad is the cough today?"      Not as bad as in July but gets coughing spells with left chest discomfort  3. SPUTUM: "Describe the color of your sputum" (none, dry cough; clear, white, yellow, green)     Clear some brown at times  4. HEMOPTYSIS: "Are you coughing up any blood?" If so ask: "How much?" (flecks, streaks, tablespoons, etc.)     Denies 5. DIFFICULTY BREATHING: "Are you having difficulty breathing?" If Yes, ask: "How bad is it?" (e.g., mild, moderate, severe)    - MILD: No SOB at rest, mild SOB with walking, speaks normally in sentences, can lie down, no retractions, pulse < 100.    - MODERATE: SOB at rest, SOB with minimal exertion and prefers to sit, cannot lie down flat, speaks in phrases, mild retractions, audible wheezing, pulse 100-120.    - SEVERE: Very SOB at rest, speaks in single words, struggling to breathe, sitting hunched forward, retractions, pulse > 120      Denies SOB 6. FEVER: "Do you have a fever?" If Yes, ask: "What is your temperature, how was it measured, and when did it start?"     no 7. CARDIAC HISTORY: "Do you have any history of heart disease?" (e.g., heart attack, congestive heart failure)      HTN , high cholesterol 8. LUNG HISTORY: "Do you have any history of lung disease?"  (e.g., pulmonary embolus, asthma, emphysema)     Allergies  9. PE RISK FACTORS: "Do you have a history of blood clots?" (or: recent major surgery, recent prolonged travel, bedridden)     na 10. OTHER SYMPTOMS: "Do you have any other symptoms?" (e.g., runny nose, wheezing, chest pain)       Cough , productive at times , weak 11. PREGNANCY: "Is there any chance you are pregnant?" "When was your last menstrual period?"       na 12. TRAVEL: "Have you traveled out of the  country in the last month?" (e.g., travel history, exposures)       na  Protocols used: Cough - Acute Productive-A-AH

## 2021-07-26 NOTE — Telephone Encounter (Signed)
C/o left chest discomfort under left breast area. Patient denies SOB, dizziness or lightheadedness. C/o coughing spells and unsure if chest discomfort is due to coughing or possible signs of pneumonia . C/o symptoms of coughing spells 3-4 times a day. Recently seen by PCP in July and completed course of prednisone and has 4 days of antibiotics left. C/o finishing 3rd bottle of robitussin since July 22. C/o feeling  "Foggy" and weak. Instructed patient to increase water intake and drink warm fluids for cough. Continue hard candy and cough drops as needed. Patient concerned he may need chest x-ray due to continued productive cough and hx pneumonia in the past. Please advise. Care advise given. Patient verbalized understanding of care advise and to call back or go to [facility] or ED if symptoms worsen.

## 2021-07-27 ENCOUNTER — Ambulatory Visit
Admission: RE | Admit: 2021-07-27 | Discharge: 2021-07-27 | Disposition: A | Discharge status: Home / Self Care | Payer: Federal, State, Local not specified - PPO | Source: Ambulatory Visit | Attending: Family Medicine | Admitting: Family Medicine

## 2021-07-27 ENCOUNTER — Ambulatory Visit
Admission: RE | Admit: 2021-07-27 | Discharge: 2021-07-27 | Disposition: A | Discharge status: Home / Self Care | Payer: Federal, State, Local not specified - PPO | Attending: Family Medicine | Admitting: Family Medicine

## 2021-07-27 ENCOUNTER — Other Ambulatory Visit: Payer: Self-pay

## 2021-07-27 DIAGNOSIS — R059 Cough, unspecified: Secondary | ICD-10-CM | POA: Diagnosis not present

## 2021-07-27 DIAGNOSIS — I10 Essential (primary) hypertension: Secondary | ICD-10-CM | POA: Diagnosis not present

## 2021-07-27 DIAGNOSIS — R079 Chest pain, unspecified: Secondary | ICD-10-CM | POA: Diagnosis not present

## 2021-07-27 NOTE — Telephone Encounter (Signed)
Patient states he is improving some but is concerned about having pneumonia.  Requested CXR.  Dr Rosanna Randy has authorized getting x-ray. Have ordered CXR and will call patient when results are available.

## 2021-08-08 ENCOUNTER — Other Ambulatory Visit: Payer: Self-pay | Admitting: Family Medicine

## 2021-08-08 ENCOUNTER — Telehealth: Payer: Self-pay | Admitting: Family Medicine

## 2021-08-08 MED ORDER — ALBUTEROL SULFATE HFA 108 (90 BASE) MCG/ACT IN AERS
2.0000 | INHALATION_SPRAY | Freq: Four times a day (QID) | RESPIRATORY_TRACT | 0 refills | Status: DC | PRN
Start: 2021-08-08 — End: 2022-10-05

## 2021-08-08 NOTE — Telephone Encounter (Signed)
Done

## 2021-08-08 NOTE — Telephone Encounter (Signed)
If still having a cough, should be evaluated where he is for additional treatment and be sure no pneumonia.

## 2021-08-08 NOTE — Telephone Encounter (Signed)
Pt is calling to follow up from 07/21/21 dx of Bronchitis Pt reports still having cough. Pt states during OV - an inhaler was mentioned. Is this still an option? Pt is now in [place].  Please advise CB- [phone]

## 2021-08-08 NOTE — Telephone Encounter (Signed)
Pt advised.  He states he had a chest x-ray 07/27/2021 and it was normal.  He says for the most part he has improved but is cough will not go away.  Pt denies fevers, chills, shortness of breath.  He was like to try something (inhaler if possible) to help with is cough.  Pt is in [place] right now please use [facility] there.   Thanks,   -Mickel Baas

## 2021-08-15 ENCOUNTER — Other Ambulatory Visit: Payer: Self-pay | Admitting: Family Medicine

## 2021-08-15 DIAGNOSIS — K120 Recurrent oral aphthae: Secondary | ICD-10-CM

## 2021-08-18 NOTE — Progress Notes (Signed)
Established patient visit   Patient: Terry Franco   DOB: 12-11-1961   60 y.o. Male  MRN: [id number] Visit Date: 08/19/2021  Today's healthcare provider: Gwyneth Sprout, FNP   Chief Complaint  Patient presents with   Cough   Burn    Patient states that he was pulling a sugary bagel out of toaster and states that hot sugar dripped on the crease of his middle finger on his left hand and states that now he has noticed a dark spot under skin and would like to have it looked at.    Subjective  -------------------------------------------------------------------------------------------------------------------- Cough This is a recurrent problem. The current episode started more than 1 month ago. The problem occurs constantly. The cough is Productive of sputum. Associated symptoms include heartburn, postnasal drip, a sore throat and shortness of breath. Pertinent negatives include no chest pain, chills, ear congestion, ear pain, fever, headaches, hemoptysis, myalgias, nasal congestion, rash, rhinorrhea, sweats, weight loss or wheezing. Associated symptoms comments: reflux. Nothing aggravates the symptoms. He has tried prescription cough suppressant and steroid inhaler (Doxycycline and Prednisone) for the symptoms. The treatment provided mild relief. His past medical history is significant for bronchitis.  HPI     Burn    Additional comments: Patient states that he was pulling a sugary bagel out of toaster and states that hot sugar dripped on the crease of his middle finger on his left hand and states that now he has noticed a dark spot under skin and would like to have it looked at.       Last edited by Minette Headland, CMA on 08/19/2021  1:17 PM.      Medications: Outpatient Medications Prior to Visit  Medication Sig   albuterol (VENTOLIN HFA) 108 (90 Base) MCG/ACT inhaler Inhale 2 puffs into the lungs every 6 (six) hours as needed for wheezing or shortness of breath.   amLODipine  (NORVASC) 5 MG tablet Take 5 mg by mouth daily.   loratadine (CLARITIN) 10 MG tablet Take 10 mg by mouth daily.   losartan-hydrochlorothiazide (HYZAAR) 100-25 MG per tablet Take 1 tablet by mouth daily.    meloxicam (MOBIC) 15 MG tablet Take 15 mg by mouth daily.   methocarbamol (ROBAXIN) 750 MG tablet Take 750 mg by mouth 3 (three) times daily.   metoprolol succinate (TOPROL-XL) 25 MG 24 hr tablet TAKE 1 TABLET(25 MG) BY MOUTH DAILY   pravastatin (PRAVACHOL) 40 MG tablet Take 1 tablet (40 mg total) by mouth daily.   triamcinolone (NASACORT) 55 MCG/ACT AERO nasal inhaler Place 2 sprays into the nose daily.   valACYclovir (VALTREX) 1000 MG tablet TAKE ONE TABLET BY MOUTH ONE TIME DAILY   amLODipine (NORVASC) 10 MG tablet Take 1 tablet (10 mg total) by mouth daily for 30 days.   [DISCONTINUED] benzonatate (TESSALON) 200 MG capsule Take 1 capsule (200 mg total) by mouth 2 (two) times daily as needed for cough.   [DISCONTINUED] doxycycline (VIBRA-TABS) 100 MG tablet Take 1 tablet (100 mg total) by mouth 2 (two) times daily.   [DISCONTINUED] meloxicam (MOBIC) 7.5 MG tablet Take 7.5 mg by mouth daily. (Patient not taking: Reported on 08/19/2021)   [DISCONTINUED] montelukast (SINGULAIR) 10 MG tablet Take 10 mg by mouth daily. (Patient not taking: No sig reported)   [DISCONTINUED] predniSONE (DELTASONE) 5 MG tablet Taper down by 1 tablet by mouth daily starting at 6 day 1, then 5 day 2, 4 day 3, 3 day 4, 2 day 5 and 1  day 6. Divide dosage among meals and bedtime each day.   No facility-administered medications prior to visit.    Review of Systems  Constitutional:  Positive for activity change, appetite change and fatigue. Negative for chills, fever and weight loss.  HENT:  Positive for postnasal drip, sneezing and sore throat. Negative for ear pain and rhinorrhea.   Respiratory:  Positive for cough and shortness of breath. Negative for hemoptysis and wheezing.   Cardiovascular:  Negative for chest pain.   Gastrointestinal:  Positive for heartburn.  Musculoskeletal:  Negative for myalgias.  Skin:  Negative for rash.  Neurological:  Negative for headaches.      Objective  -------------------------------------------------------------------------------------------------------------------- BP 130/85   Pulse 70   Temp 98 F (36.7 C) (Oral)   Resp 16   Wt 177 lb 6.4 oz (80.5 kg)   SpO2 95%   BMI 26.20 kg/m    Physical Exam Vitals and nursing note reviewed.  Constitutional:      General: He is not in acute distress.    Appearance: He is normal weight. He is not ill-appearing, toxic-appearing or diaphoretic.  HENT:     Head: Normocephalic and atraumatic.     Nose: Congestion and rhinorrhea present.     Mouth/Throat:     Mouth: Mucous membranes are moist.     Pharynx: Oropharynx is clear.  Cardiovascular:     Rate and Rhythm: Normal rate and regular rhythm.     Pulses: Normal pulses.     Heart sounds: Normal heart sounds. No murmur heard.   No friction rub. No gallop.  Pulmonary:     Effort: Pulmonary effort is normal. No respiratory distress.     Breath sounds: Normal breath sounds. No stridor. No wheezing, rhonchi or rales.  Chest:     Chest wall: No tenderness.  Abdominal:     General: Bowel sounds are normal.     Palpations: Abdomen is soft.  Musculoskeletal:        General: No swelling, tenderness, deformity or signs of injury. Normal range of motion.  Skin:    General: Skin is warm and dry.     Capillary Refill: Capillary refill takes less than 2 seconds.     Coloration: Skin is not jaundiced or pale.     Findings: No bruising, erythema, lesion or rash.  Neurological:     General: No focal deficit present.     Mental Status: He is alert and oriented to person, place, and time. Mental status is at baseline.  Psychiatric:        Mood and Affect: Mood normal.        Behavior: Behavior normal.        Thought Content: Thought content normal.        Judgment: Judgment  normal.     No results found for any visits on 08/19/21.  Assessment & Plan  ---------------------------------------------------------------------------------------------------------------------- Problem List Items Addressed This Visit       Cardiovascular and Mediastinum   BP (high blood pressure)    Chronic condition Well controlled Followed by cards      Relevant Medications   amLODipine (NORVASC) 5 MG tablet     Digestive   GERD (gastroesophageal reflux disease)    Chronic condition Poor control Has not been on medication Start ppi       Relevant Medications   pantoprazole (PROTONIX) 40 MG tablet   sucralfate (CARAFATE) 1 GM/10ML suspension   Barrett's esophagus determined by endoscopy    Patient reported;  was previous placed on medication, presumably PPI, from GI however did not follow up for refills  Referral back to GI for repeat scan and possible swallow study        Other   Chronic cough - Primary    Ongoing since 07/08/21 Has taken 6 covid tests- all negative Wax/wanes Sleeps OK- however, coughs/spits up for 45 mins upon wakening D/c of diet and activity Add inhaler amb ref pulm for pfts Repeat cxr      Relevant Medications   pantoprazole (PROTONIX) 40 MG tablet   budesonide-formoterol (SYMBICORT) 80-4.5 MCG/ACT inhaler   sucralfate (CARAFATE) 1 GM/10ML suspension   Other Relevant Orders   Ambulatory referral to Gastroenterology   Ambulatory referral to Pulmonology   DG Chest 2 View   CBC with Differential/Platelet   Comprehensive metabolic panel   Hemoglobin A1c   VITAMIN D 25 Hydroxy (Vit-D Deficiency, Fractures)   Vitamin B12   TSH   PSA   Magnesium   Lipid panel   Other viral warts    L middle finger; small movable lesion No longer discolored Continue to monitor        Return if symptoms worsen or fail to improve.      Vonna Kotyk, FNP, have reviewed all documentation for this visit. The documentation on 08/19/21 for the  exam, diagnosis, procedures, and orders are all accurate and complete.    Gwyneth Sprout, [place] [phone] (phone) [phone] (fax)  [place]

## 2021-08-19 ENCOUNTER — Encounter: Payer: Self-pay | Admitting: Family Medicine

## 2021-08-19 ENCOUNTER — Ambulatory Visit (INDEPENDENT_AMBULATORY_CARE_PROVIDER_SITE_OTHER): Payer: Federal, State, Local not specified - PPO | Admitting: Family Medicine

## 2021-08-19 ENCOUNTER — Ambulatory Visit
Admission: RE | Admit: 2021-08-19 | Discharge: 2021-08-19 | Disposition: A | Discharge status: Home / Self Care | Payer: Federal, State, Local not specified - PPO | Attending: Family Medicine | Admitting: Family Medicine

## 2021-08-19 ENCOUNTER — Ambulatory Visit
Admission: RE | Admit: 2021-08-19 | Discharge: 2021-08-19 | Disposition: A | Discharge status: Home / Self Care | Payer: Federal, State, Local not specified - PPO | Source: Ambulatory Visit | Attending: Family Medicine | Admitting: Family Medicine

## 2021-08-19 VITALS — BP 130/85 | HR 70 | Temp 98.0°F | Resp 16 | Wt 177.4 lb

## 2021-08-19 DIAGNOSIS — I10 Essential (primary) hypertension: Secondary | ICD-10-CM | POA: Diagnosis not present

## 2021-08-19 DIAGNOSIS — K219 Gastro-esophageal reflux disease without esophagitis: Secondary | ICD-10-CM

## 2021-08-19 DIAGNOSIS — K2289 Other specified disease of esophagus: Secondary | ICD-10-CM | POA: Insufficient documentation

## 2021-08-19 DIAGNOSIS — R053 Chronic cough: Secondary | ICD-10-CM | POA: Insufficient documentation

## 2021-08-19 DIAGNOSIS — B078 Other viral warts: Secondary | ICD-10-CM | POA: Insufficient documentation

## 2021-08-19 DIAGNOSIS — K2270 Barrett's esophagus without dysplasia: Secondary | ICD-10-CM

## 2021-08-19 DIAGNOSIS — R059 Cough, unspecified: Secondary | ICD-10-CM | POA: Diagnosis not present

## 2021-08-19 MED ORDER — PANTOPRAZOLE SODIUM 40 MG PO TBEC: 40.0000 mg | DELAYED_RELEASE_TABLET | Freq: Two times a day (BID) | ORAL | 6 refills | Status: DC

## 2021-08-19 MED ORDER — SUCRALFATE 1 GM/10ML PO SUSP: 1.0000 g | Freq: Three times a day (TID) | ORAL | 1 refills | Status: DC

## 2021-08-19 MED ORDER — METHYLPREDNISOLONE ACETATE 40 MG/ML IJ SUSP
40.0000 mg | Freq: Once | INTRAMUSCULAR | Status: AC
  Administered 2021-08-19: 40 mg via INTRAMUSCULAR

## 2021-08-19 MED ORDER — BUDESONIDE-FORMOTEROL FUMARATE 80-4.5 MCG/ACT IN AERO: 2.0000 | INHALATION_SPRAY | Freq: Two times a day (BID) | RESPIRATORY_TRACT | 3 refills | Status: DC

## 2021-08-19 MED ORDER — METHYLPREDNISOLONE SODIUM SUCC 40 MG IJ SOLR: 40.0000 mg | Freq: Once | INTRAMUSCULAR | Status: DC

## 2021-08-19 NOTE — Assessment & Plan Note (Signed)
Ongoing since 07/08/21 Has taken 6 covid tests- all negative Wax/wanes Sleeps OK- however, coughs/spits up for 45 mins upon wakening D/c of diet and activity Add inhaler amb ref pulm for pfts Repeat cxr

## 2021-08-19 NOTE — Assessment & Plan Note (Signed)
Patient reported; was previous placed on medication, presumably PPI, from GI however did not follow up for refills  Referral back to GI for repeat scan and possible swallow study

## 2021-08-19 NOTE — Assessment & Plan Note (Signed)
L middle finger; small movable lesion No longer discolored Continue to monitor

## 2021-08-19 NOTE — Assessment & Plan Note (Signed)
Chronic condition Well controlled Followed by cards

## 2021-08-19 NOTE — Assessment & Plan Note (Signed)
Chronic condition Poor control Has not been on medication Start ppi

## 2021-08-20 LAB — COMPREHENSIVE METABOLIC PANEL
ALT: 40 IU/L (ref 0–44)
AST: 26 IU/L (ref 0–40)
Albumin/Globulin Ratio: 1.8 (ref 1.2–2.2)
Albumin: 4.6 g/dL (ref 3.8–4.8)
Alkaline Phosphatase: 68 IU/L (ref 44–121)
BUN/Creatinine Ratio: 12 (ref 10–24)
BUN: 12 mg/dL (ref 8–27)
Bilirubin Total: 0.6 mg/dL (ref 0.0–1.2)
CO2: 22 mmol/L (ref 20–29)
Calcium: 9.6 mg/dL (ref 8.6–10.2)
Chloride: 100 mmol/L (ref 96–106)
Creatinine, Ser: 0.97 mg/dL (ref 0.76–1.27)
Globulin, Total: 2.5 g/dL (ref 1.5–4.5)
Glucose: 92 mg/dL (ref 65–99)
Potassium: 3.9 mmol/L (ref 3.5–5.2)
Sodium: 139 mmol/L (ref 134–144)
Total Protein: 7.1 g/dL (ref 6.0–8.5)
eGFR: 89 mL/min/{1.73_m2} (ref >59)

## 2021-08-20 LAB — TSH: TSH: 2.16 u[IU]/mL (ref 0.450–4.500)

## 2021-08-20 LAB — CBC WITH DIFFERENTIAL/PLATELET
Basophils Absolute: 0.1 10*3/uL (ref 0.0–0.2)
Basos: 1 %
EOS (ABSOLUTE): 0.2 10*3/uL (ref 0.0–0.4)
Eos: 2 %
Hematocrit: 44.5 % (ref 37.5–51.0)
Hemoglobin: 15.4 g/dL (ref 13.0–17.7)
Immature Grans (Abs): 0 10*3/uL (ref 0.0–0.1)
Immature Granulocytes: 0 %
Lymphocytes Absolute: 2.2 10*3/uL (ref 0.7–3.1)
Lymphs: 24 %
MCH: 32.8 pg (ref 26.6–33.0)
MCHC: 34.6 g/dL (ref 31.5–35.7)
MCV: 95 fL (ref 79–97)
Monocytes Absolute: 0.7 10*3/uL (ref 0.1–0.9)
Monocytes: 8 %
Neutrophils Absolute: 5.9 10*3/uL (ref 1.4–7.0)
Neutrophils: 65 %
Platelets: 315 10*3/uL (ref 150–450)
RBC: 4.7 x10E6/uL (ref 4.14–5.80)
RDW: 13.3 % (ref 11.6–15.4)
WBC: 9.1 10*3/uL (ref 3.4–10.8)

## 2021-08-20 LAB — HEMOGLOBIN A1C
Est. average glucose Bld gHb Est-mCnc: 114 mg/dL
Hgb A1c MFr Bld: 5.6 % (ref 4.8–5.6)

## 2021-08-20 LAB — LIPID PANEL
Chol/HDL Ratio: 5.2 ratio — ABNORMAL HIGH (ref 0.0–5.0)
Cholesterol, Total: 202 mg/dL — ABNORMAL HIGH (ref 100–199)
HDL: 39 mg/dL — ABNORMAL LOW (ref >39)
LDL Chol Calc (NIH): 131 mg/dL — ABNORMAL HIGH (ref 0–99)
Triglycerides: 180 mg/dL — ABNORMAL HIGH (ref 0–149)
VLDL Cholesterol Cal: 32 mg/dL (ref 5–40)

## 2021-08-20 LAB — MAGNESIUM: Magnesium: 2.2 mg/dL (ref 1.6–2.3)

## 2021-08-20 LAB — PSA: Prostate Specific Ag, Serum: 2.1 ng/mL (ref 0.0–4.0)

## 2021-08-20 LAB — VITAMIN D 25 HYDROXY (VIT D DEFICIENCY, FRACTURES): Vit D, 25-Hydroxy: 32 ng/mL (ref 30.0–100.0)

## 2021-08-20 LAB — VITAMIN B12: Vitamin B-12: 663 pg/mL (ref 232–1245)

## 2021-08-21 ENCOUNTER — Other Ambulatory Visit: Payer: Self-pay | Admitting: Family Medicine

## 2021-08-21 DIAGNOSIS — E782 Mixed hyperlipidemia: Secondary | ICD-10-CM

## 2021-08-21 MED ORDER — ROSUVASTATIN CALCIUM 10 MG PO TABS: 10.0000 mg | ORAL_TABLET | Freq: Every day | ORAL | 3 refills | Status: DC

## 2021-08-21 MED ORDER — COENZYME Q10 30 MG PO CAPS: 30.0000 mg | ORAL_CAPSULE | Freq: Three times a day (TID) | ORAL | 3 refills | Status: DC

## 2021-08-26 DIAGNOSIS — R002 Palpitations: Secondary | ICD-10-CM | POA: Diagnosis not present

## 2021-08-26 DIAGNOSIS — I10 Essential (primary) hypertension: Secondary | ICD-10-CM | POA: Diagnosis not present

## 2021-08-26 DIAGNOSIS — R0602 Shortness of breath: Secondary | ICD-10-CM | POA: Diagnosis not present

## 2021-08-26 DIAGNOSIS — E7800 Pure hypercholesterolemia, unspecified: Secondary | ICD-10-CM | POA: Diagnosis not present

## 2021-09-12 DIAGNOSIS — R0602 Shortness of breath: Secondary | ICD-10-CM | POA: Diagnosis not present

## 2021-09-21 DIAGNOSIS — R002 Palpitations: Secondary | ICD-10-CM | POA: Diagnosis not present

## 2021-09-21 DIAGNOSIS — E7800 Pure hypercholesterolemia, unspecified: Secondary | ICD-10-CM | POA: Diagnosis not present

## 2021-09-21 DIAGNOSIS — I10 Essential (primary) hypertension: Secondary | ICD-10-CM | POA: Diagnosis not present

## 2021-10-03 ENCOUNTER — Other Ambulatory Visit: Payer: Self-pay

## 2021-10-03 ENCOUNTER — Ambulatory Visit: Payer: Federal, State, Local not specified - PPO | Admitting: Gastroenterology

## 2021-10-03 ENCOUNTER — Encounter: Payer: Self-pay | Admitting: Gastroenterology

## 2021-10-03 VITALS — BP 121/87 | HR 60 | Temp 98.1°F | Ht 69.0 in | Wt 174.0 lb

## 2021-10-03 DIAGNOSIS — K2270 Barrett's esophagus without dysplasia: Secondary | ICD-10-CM

## 2021-10-03 DIAGNOSIS — Z1211 Encounter for screening for malignant neoplasm of colon: Secondary | ICD-10-CM

## 2021-10-03 DIAGNOSIS — R1319 Other dysphagia: Secondary | ICD-10-CM

## 2021-10-03 MED ORDER — PEG 3350-KCL-NABCB-NACL-NASULF 236 G PO SOLR: 4000.0000 mL | Freq: Once | ORAL | 0 refills | Status: AC

## 2021-10-03 NOTE — Progress Notes (Signed)
Terry Franco [address]  [place]  [place], [place] [address]  Main: [phone]  Fax: [phone]   Gastroenterology Consultation  Referring Provider:     Gwyneth Sprout, FNP Primary Care Physician:  Gwyneth Sprout, FNP Reason for Consultation:     Chronic cough        HPI:    Chief Complaint  Patient presents with   New Patient (Initial Visit)   Cough    X 3 months... pt reports Sx have improved x 2 weeks.. but still has intermittent epigastric discomfort, reports he can feel his food going down almost as if it will get stuck.... Pt is taking Pantoprazole QD w/ some relief... pt reports he has seen allergist in the past and has had EGD was told he had Barrett's esophagus    Terry Franco is a 60 y.o. y/o male referred for consultation & management  by Dr. Rollene Rotunda, Jaci Standard, FNP.  Patient reports having a cough for 9 weeks and states PCP referred him to pulmonology for this as well.  PCP note reviewed from 08/19/2021 and they recommended ambulatory referral to pulmonary for PFTs.  Chest x-ray was also done and did not show any active cardiopulmonary disease.  Patient was also started on PPI by PCP and states his cough has improved.  Patient states he was previously on PPI after his previous endoscopy but the prescription only lasted 6 weeks and since he was asymptomatic after the 6 weeks, he never followed up or continue the medication after that.  He also reports dysphagia and odynophagia for the same amount of time.  No weight loss.  No nausea or vomiting.  Does report increased life stressors over the last year and states when this occurs his reflux gets worse.  Reports having a previous upper endoscopy and was told that he had surveillance esophagus in 2016.  I do see that patient had encounters with Dr. Allen Norris and Dr. Tyson Babinski in 2010, 2011 and 2016.  However, the notes or procedure reports related to these encounters are not available.  Patient states she may have it  available in his MyChart and can try to access it and send it to [place] if he is able to find it.  I have looked under media, provation, procedures, and I am unable to find previous report.  CBC CMP from 08/19/2021 reassuring  Past Medical History:  Diagnosis Date   Allergy    Anxiety    Arthritis    Atypical mole 02/17/2020   R epigastric/excision   Depression    Dysplastic nevus 05/26/2021   left prox tricep at the axillary fold - severe - margins free but close, recheck 12/2021   GERD (gastroesophageal reflux disease)    Herpes simplex infection    type 1   Hyperlipidemia    Hypertension     Past Surgical History:  Procedure Laterality Date   HERNIA REPAIR      Prior to Admission medications   Medication Sig Start Date End Date Taking? Authorizing Provider  amLODipine (NORVASC) 5 MG tablet Take 5 mg by mouth daily. 08/15/21  Yes [provider]  co-enzyme Q-10 30 MG capsule Take 1 capsule (30 mg total) by mouth 3 (three) times daily. 08/21/21  Yes Gwyneth Sprout, FNP  loratadine (CLARITIN) 10 MG tablet Take 10 mg by mouth daily.   Yes [provider]  losartan-hydrochlorothiazide (HYZAAR) 100-25 MG per tablet Take 1 tablet by mouth daily.  12/30/14  Yes [provider]  meloxicam (MOBIC) 15 MG tablet Take 15 mg by mouth daily. 08/15/21  Yes [provider]  methocarbamol (ROBAXIN) 750 MG tablet Take 750 mg by mouth 3 (three) times daily.   Yes [provider]  metoprolol succinate (TOPROL-XL) 25 MG 24 hr tablet TAKE 1 TABLET(25 MG) BY MOUTH DAILY 12/21/19  Yes Pollak, Adriana M, PA-C  pantoprazole (PROTONIX) 40 MG tablet Take 1 tablet (40 mg total) by mouth 2 (two) times daily. 08/19/21  Yes Tally Joe T, FNP  polyethylene glycol (GOLYTELY) 236 g solution Take 4,000 mLs by mouth once for 1 dose. 10/03/21 10/03/21 Yes Terry Franco B, MD  rosuvastatin (CRESTOR) 10 MG tablet Take 1 tablet (10 mg total) by mouth daily. 08/21/21  Yes Gwyneth Sprout, FNP  sucralfate (CARAFATE) 1 GM/10ML suspension Take 10 mLs (1 g total) by mouth 4 (four) times daily -  with meals and at bedtime. 08/19/21  Yes Gwyneth Sprout, FNP  triamcinolone (NASACORT) 55 MCG/ACT AERO nasal inhaler Place 2 sprays into the nose daily.   Yes [provider]  valACYclovir (VALTREX) 1000 MG tablet TAKE ONE TABLET BY MOUTH ONE TIME DAILY 08/15/21  Yes Birdie Sons, MD  albuterol (VENTOLIN HFA) 108 (90 Base) MCG/ACT inhaler Inhale 2 puffs into the lungs every 6 (six) hours as needed for wheezing or shortness of breath. Patient not taking: Reported on 10/03/2021 08/08/21   Chrismon, Vickki Muff, PA-C  budesonide-formoterol (SYMBICORT) 80-4.5 MCG/ACT inhaler Inhale 2 puffs into the lungs 2 (two) times daily. Patient not taking: Reported on 10/03/2021 08/19/21   Gwyneth Sprout, FNP    Family History  Problem Relation Age of Onset   Cancer Mother        melanoma   Cancer Father        lung cancer   Heart disease Father    Benign prostatic hyperplasia Father    Prostate cancer Paternal Uncle    Benign prostatic hyperplasia Brother    Urolithiasis Brother    Kidney cancer Neg Hx    Kidney disease Neg Hx      Social History   Tobacco Use   Smoking status: Never   Smokeless tobacco: Never  Vaping Use   Vaping Use: Never used  Substance Use Topics   Alcohol use: Yes    Alcohol/week: 0.0 standard drinks   Drug use: No    Allergies as of 10/03/2021 - Review Complete 10/03/2021  Allergen Reaction Noted   Ibuprofen Swelling 06/04/2015   Versed [midazolam] Nausea And Vomiting 03/21/2016   Penicillin v potassium Rash and Other (See Comments) 06/04/2015    Review of Systems:    All systems reviewed and negative except where noted in HPI.   Physical Exam:  Constitutional: General:   Alert,  Well-developed, well-nourished, pleasant and cooperative in NAD BP 121/87   Pulse 60   Temp 98.1 F (36.7 C) (Oral)   Ht 5\' 9"  (1.753 m)   Wt 174 lb (78.9 kg)   BMI  25.70 kg/m   Eyes:  Sclera clear, no icterus.   Conjunctiva pink. PERRLA  Ears:  No scars, lesions or masses, Normal auditory acuity. Nose:  No deformity, discharge, or lesions. Mouth:  No deformity or lesions, oropharynx pink & moist.  Neck:  Supple; no masses or thyromegaly.  Respiratory: Normal respiratory effort, Normal percussion  Gastrointestinal: Soft, non-tender and non-distended without masses, hepatosplenomegaly or hernias noted.  No guarding or rebound tenderness.  Cardiac: No clubbing or edema.  No cyanosis. Normal posterior tibial pedal pulses noted.  Lymphatic:  No significant cervical or axillary adenopathy.  Psych:  Alert and cooperative. Normal mood and affect.  Musculoskeletal:  Normal gait. Head normocephalic, atraumatic. Symmetrical without gross deformities. 5/5 Upper and Lower extremity strength bilaterally.  Skin: Warm. Intact without significant lesions or rashes. No jaundice.  Neurologic:  Face symmetrical, tongue midline, Normal sensation to touch;  grossly normal neurologically.  Psych:  Alert and oriented x3, Alert and cooperative. Normal mood and affect.   Labs: CBC    Component Value Date/Time   WBC 9.1 08/19/2021 1402   WBC 8.9 04/06/2016 2347   RBC 4.70 08/19/2021 1402   RBC 4.48 04/06/2016 2347   HGB 15.4 08/19/2021 1402   HCT 44.5 08/19/2021 1402   PLT 315 08/19/2021 1402   MCV 95 08/19/2021 1402   MCH 32.8 08/19/2021 1402   MCH 32.2 04/06/2016 2347   MCHC 34.6 08/19/2021 1402   MCHC 36.7 (H) 04/06/2016 2347   RDW 13.3 08/19/2021 1402   LYMPHSABS 2.2 08/19/2021 1402   EOSABS 0.2 08/19/2021 1402   BASOSABS 0.1 08/19/2021 1402   CMP     Component Value Date/Time   NA 139 08/19/2021 1402   K 3.9 08/19/2021 1402   CL 100 08/19/2021 1402   CO2 22 08/19/2021 1402   GLUCOSE 92 08/19/2021 1402   GLUCOSE 114 (H) 04/06/2016 2347   BUN 12 08/19/2021 1402   CREATININE 0.97 08/19/2021 1402   CALCIUM 9.6 08/19/2021 1402   PROT 7.1  08/19/2021 1402   ALBUMIN 4.6 08/19/2021 1402   AST 26 08/19/2021 1402   ALT 40 08/19/2021 1402   ALKPHOS 68 08/19/2021 1402   BILITOT 0.6 08/19/2021 1402   GFRNONAA 80 10/08/2020 1101   GFRAA 93 10/08/2020 1101    Imaging Studies: Chest x-ray no active cardiopulmonary disease  Assessment and Plan:   Terry Franco is a 60 y.o. y/o male has been referred for chronic cough which has resolved at this time, but patient reporting dysphagia, history of Barrett's esophagus, chronic reflux  EGD indicated for dysphagia and follow-up of Barrett's esophagus, GERD  Our clinic will try to access in system to see if previous EGD records can be found.  Patient was also advised to send [place] the records if he is able to locate it in his MyChart   Patient is also reporting breakthrough symptoms of reflux despite taking Protonix twice daily  His cough may have been related to acid reflux as it has improved with PPI.  Continue pulmonology work-up as recommended by PCP as well  Acid reflux lifestyle modifications recommended  He may have a hiatal hernia.  He may need pH study in the future  He attributes a lot of his symptoms to increase stressors that he said he states his reflux gets worse when he has increased stress and he is working on this.  He states he was also due for screening colonoscopy last year.  I do see that a referral was placed, however due to [place] pandemic patient states he did not get this done.  Agreeable to screening colonoscopy at this time as well  I have discussed alternative options, risks & benefits,  which include, but are not limited to, bleeding, infection, perforation,respiratory complication & drug reaction.  The patient agrees with this plan & written consent will be obtained.      Dr Terry Franco  Speech recognition software was used to dictate  the above note.

## 2021-10-06 ENCOUNTER — Telehealth: Payer: Self-pay | Admitting: Gastroenterology

## 2021-10-06 NOTE — Telephone Encounter (Signed)
Previous records obtained and sent for scanning. EGD in 2016 done for dysphagia showed mucosal changes suspicious for barretts. Dilation was done with 54 Fr maloney dilator. Pathology showed mild inflammation at the GE junction with no intestinal metaplasia. A mid esophagus biopsy specimen showed slightly inflamed squamous epithelium with inlet patch, with no intestinal metaplasia.

## 2021-10-10 ENCOUNTER — Other Ambulatory Visit: Payer: Self-pay

## 2021-10-10 ENCOUNTER — Ambulatory Visit (INDEPENDENT_AMBULATORY_CARE_PROVIDER_SITE_OTHER): Payer: Federal, State, Local not specified - PPO | Admitting: Family Medicine

## 2021-10-10 ENCOUNTER — Encounter: Payer: Self-pay | Admitting: Family Medicine

## 2021-10-10 ENCOUNTER — Encounter: Payer: Self-pay | Admitting: Physician Assistant

## 2021-10-10 VITALS — BP 131/79 | HR 48 | Temp 97.3°F | Resp 16 | Ht 69.0 in | Wt 175.2 lb

## 2021-10-10 DIAGNOSIS — J301 Allergic rhinitis due to pollen: Secondary | ICD-10-CM

## 2021-10-10 DIAGNOSIS — M5116 Intervertebral disc disorders with radiculopathy, lumbar region: Secondary | ICD-10-CM

## 2021-10-10 DIAGNOSIS — R001 Bradycardia, unspecified: Secondary | ICD-10-CM | POA: Diagnosis not present

## 2021-10-10 DIAGNOSIS — F4329 Adjustment disorder with other symptoms: Secondary | ICD-10-CM | POA: Diagnosis not present

## 2021-10-10 DIAGNOSIS — Z125 Encounter for screening for malignant neoplasm of prostate: Secondary | ICD-10-CM

## 2021-10-10 DIAGNOSIS — Z0000 Encounter for general adult medical examination without abnormal findings: Secondary | ICD-10-CM | POA: Insufficient documentation

## 2021-10-10 DIAGNOSIS — K219 Gastro-esophageal reflux disease without esophagitis: Secondary | ICD-10-CM

## 2021-10-10 DIAGNOSIS — I10 Essential (primary) hypertension: Secondary | ICD-10-CM
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
## 2021-10-10 NOTE — Assessment & Plan Note (Signed)
Pt reports broken tooth- due for dental Due for eye exam Things to do to keep yourself healthy  - Exercise at least 30-45 minutes a day, 3-4 days a week.  - Eat a low-fat diet with lots of fruits and vegetables, up to 7-9 servings per day.  - Seatbelts can save your life. Wear them always.  - Smoke detectors on every level of your home, check batteries every year.  - Eye Doctor - have an eye exam every 1-2 years  - Safe sex - if you may be exposed to STDs, use a condom.  - Alcohol -  If you drink, do it moderately, less than 2 drinks per day.  - [place]. Choose someone to speak for you if you are not able.  - Depression is common in our stressful world.If you're feeling down or losing interest in things you normally enjoy, please come in for a visit.  - Violence - If anyone is threatening or hurting you, please call immediately.

## 2021-10-10 NOTE — Assessment & Plan Note (Signed)
Denies complaints; routine screening

## 2021-10-10 NOTE — Assessment & Plan Note (Signed)
Chronic, stable Managed by cards per pt pref

## 2021-10-10 NOTE — Assessment & Plan Note (Signed)
Continue to trend; recommend reaching back out to cards if in 30s Recommend change metoprolol

## 2021-10-10 NOTE — Assessment & Plan Note (Signed)
Upcoming scope Less complaints- still some evening complaints Remains sleeping flat d/t preference compliant with medication

## 2021-10-11 ENCOUNTER — Ambulatory Visit: Payer: Federal, State, Local not specified - PPO | Admitting: Internal Medicine

## 2021-10-11 ENCOUNTER — Encounter: Payer: Self-pay | Admitting: Internal Medicine

## 2021-10-11 VITALS — BP 130/88 | HR 48 | Temp 97.1°F | Ht 69.0 in | Wt 173.6 lb

## 2021-10-11 DIAGNOSIS — J321 Chronic frontal sinusitis: Secondary | ICD-10-CM | POA: Diagnosis not present

## 2021-10-11 DIAGNOSIS — G4719 Other hypersomnia: Secondary | ICD-10-CM

## 2021-10-11 DIAGNOSIS — R0602 Shortness of breath: Secondary | ICD-10-CM | POA: Diagnosis not present

## 2021-10-11 LAB — PSA: Prostate Specific Ag, Serum: 1.3 ng/mL (ref 0.0–4.0)

## 2021-10-11 NOTE — Patient Instructions (Signed)
Follow-up GI Follow-up cardiology ENT referral for chronic sinusitis Sleep test to assess for sleep apnea

## 2021-10-11 NOTE — Progress Notes (Signed)
[facility] Consultation      Date: 10/11/2021,   MRN# [id number] Terry Franco 03-Jul-1961    CHIEF COMPLAINT:   Cough and SOB  HISTORY OF PRESENT ILLNESS   60 year old pleasant male seen today for assessment of cough with difficulty breathing with intermittent chest pain and fatigue over the last several months cough has slightly improved however his symptoms started in July of this year he is a non-smoker nonalcoholic but has a extensive secondhand smoke exposure In July 2022 patient had severe viral illness Tested COVID negative   Has associated symptoms of throat pain difficulty swallowing along with heartburn and indigestion  Chest x-ray reviewed from September 2022 which showed no active disease No evidence of pneumonia No effusions  No cardiomegaly  Office Spirometry 09/2021 FEV1 100% predicted FVC 88% predicted Normal ratio SPIRO WNL  Regarding GERD patient is on Protonix twice a day He has appointment with GI for endoscopy with EGD and colonoscopy   Patient has a history of allergic rhinitis Multiple antihistamines has been tried [facility] allergist Chronic postnasal drip Patient needs to see ENT  Patient had a low heart rate Patient had cardiology assessment No significant findings Patient on beta-blocker Needs to follow-up with cardiology regarding dosage  Patient does see allergist Seems to have reactive airways disease especially with pollen and grass and in the fall time Findings suggest asthma   Patient is seen today for problems and issues with sleep related to excessive daytime sleepiness Patient has been having extreme fatigue and tiredness, lack of energy +  very Loud snoring every night + struggling breathe at night and gasps for air   Discussed sleep data and reviewed with patient.  Encouraged proper weight management.  Discussed driving precautions and its relationship with hypersomnolence.  Discussed operating  dangerous equipment and its relationship with hypersomnolence.  Discussed sleep hygiene, and benefits of a fixed sleep waked time.  The importance of getting eight or more hours of sleep discussed with patient.  Discussed limiting the use of the computer and television before bedtime.  Decrease naps during the day, so night time sleep will become enhanced.  Limit caffeine, and sleep deprivation.  HTN, stroke, and heart failure are potential risk factors.   Discussed risk of untreated sleep apnea including cardiac arrhthymias, stroke, DM, pulm HTN.    EPWORTH SLEEP SCORE 5   PAST MEDICAL HISTORY   Past Medical History:  Diagnosis Date   Allergy    Anxiety    Arthritis    Atypical mole 02/17/2020   R epigastric/excision   Depression    Dysplastic nevus 05/26/2021   left prox tricep at the axillary fold - severe - margins free but close, recheck 12/2021   GERD (gastroesophageal reflux disease)    Herpes simplex infection    type 1   Hyperlipidemia    Hypertension      SURGICAL HISTORY   Past Surgical History:  Procedure Laterality Date   HERNIA REPAIR       FAMILY HISTORY   Family History  Problem Relation Age of Onset   Cancer Mother        melanoma   Cancer Father        lung cancer   Heart disease Father    Benign prostatic hyperplasia Father    Prostate cancer Paternal Uncle    Benign prostatic hyperplasia Brother    Urolithiasis Brother    Kidney cancer Neg Hx    Kidney disease Neg Hx  SOCIAL HISTORY   Social History   Tobacco Use   Smoking status: Never   Smokeless tobacco: Never  Vaping Use   Vaping Use: Never used  Substance Use Topics   Alcohol use: Yes    Alcohol/week: 0.0 standard drinks   Drug use: No     MEDICATIONS    Home Medication:  Current Outpatient Rx   Order #: [id number] Class: Normal   Order #: [id number] Class: Historical Med   Order #: [id number] Class: Normal   Order #: [id number] Class: Normal   Order #:  [id number] Class: Historical Med   Order #: [id number] Class: Historical Med   Order #: [id number] Class: Historical Med   Order #: [id number] Class: Historical Med   Order #: [id number] Class: Normal   Order #: [id number] Class: Normal   Order #: [id number] Class: Historical Med   Order #: [id number] Class: Normal   Order #: [id number] Class: Normal   Order #: [id number] Class: Historical Med    Current Medication:  Current Outpatient Medications:    albuterol (VENTOLIN HFA) 108 (90 Base) MCG/ACT inhaler, Inhale 2 puffs into the lungs every 6 (six) hours as needed for wheezing or shortness of breath., Disp: 8 g, Rfl: 0   amLODipine (NORVASC) 5 MG tablet, Take 5 mg by mouth daily., Disp: , Rfl:    budesonide-formoterol (SYMBICORT) 80-4.5 MCG/ACT inhaler, Inhale 2 puffs into the lungs 2 (two) times daily., Disp: 1 each, Rfl: 3   co-enzyme Q-10 30 MG capsule, Take 1 capsule (30 mg total) by mouth 3 (three) times daily., Disp: 270 capsule, Rfl: 3   loratadine (CLARITIN) 10 MG tablet, Take 10 mg by mouth daily., Disp: , Rfl:    losartan-hydrochlorothiazide (HYZAAR) 100-25 MG per tablet, Take 1 tablet by mouth daily. , Disp: , Rfl:    meloxicam (MOBIC) 15 MG tablet, Take 15 mg by mouth daily., Disp: , Rfl:    methocarbamol (ROBAXIN) 750 MG tablet, Take 750 mg by mouth 3 (three) times daily., Disp: , Rfl:    metoprolol succinate (TOPROL-XL) 25 MG 24 hr tablet, TAKE 1 TABLET(25 MG) BY MOUTH DAILY, Disp: 90 tablet, Rfl: 1   pantoprazole (PROTONIX) 40 MG tablet, Take 1 tablet (40 mg total) by mouth 2 (two) times daily., Disp: 60 tablet, Rfl: 6   pantoprazole (PROTONIX) 40 MG tablet, Take 1 tablet by mouth 2 (two) times daily., Disp: , Rfl:    rosuvastatin (CRESTOR) 10 MG tablet, Take 1 tablet (10 mg total) by mouth daily., Disp: 90 tablet, Rfl: 3   sucralfate (CARAFATE) 1 GM/10ML suspension, Take 10 mLs (1 g total) by mouth 4 (four) times daily -  with meals and at bedtime., Disp: 414 mL, Rfl: 1   triamcinolone  (NASACORT) 55 MCG/ACT AERO nasal inhaler, Place 2 sprays into the nose daily., Disp: , Rfl:     ALLERGIES   Ibuprofen, Versed [midazolam], and Penicillin v potassium     REVIEW OF SYSTEMS     Review of Systems:  Gen:  Denies  fever, sweats, chills weight loss  HEENT: Denies blurred vision, double vision, ear pain, eye pain, hearing loss, nose bleeds, sore throat Cardiac:  No dizziness, chest pain or heaviness, chest tightness,edema, No JVD Resp:   + cough, -sputum production, +shortness of breath,-wheezing, -hemoptysis,  Gi: Denies swallowing difficulty, stomach pain, nausea or vomiting, diarrhea, constipation, bowel incontinence Gu:  Denies bladder incontinence, burning urine Ext:   Denies Joint pain, stiffness or swelling Skin: Denies  skin rash, easy bruising or bleeding or hives Endoc:  Denies polyuria, polydipsia , polyphagia or  weight change Psych:   Denies depression, insomnia or hallucinations  Other:  All other systems negative BP 130/88 (BP Location: Left Arm, Patient Position: Sitting, Cuff Size: Normal)   Pulse (!) 48   Temp (!) 97.1 F (36.2 C) (Oral)   Ht 5\' 5"  (1.651 m)   Wt 173 lb 9.6 oz (78.7 kg)   SpO2 99%   BMI 28.89 kg/m      Physical Examination:   General Appearance: No distress  EYES PERRLA, EOM intact.   NECK Supple, No JVD Pulmonary: normal breath sounds, No wheezing.  CardiovascularNormal S1,S2.  No m/r/g.   Abdomen: Benign, Soft, non-tender. Skin:   warm, no rashes, no ecchymosis  Extremities: normal, no cyanosis, clubbing. Neuro:without focal findings,  speech normal  PSYCHIATRIC: Mood, affect within normal limits.   ALL OTHER ROS ARE NEGATIVE        ASSESSMENT/PLAN   60 year old pleasant white male seen today for a persistent cough with some shortness of breath most likely related to a viral pneumonia and bronchitis in July 2022 with signs symptoms of reflux disease along with allergic rhinitis in the setting of probable  underlying diagnosis of reactive airways disease with asthma  His cough and exercise tolerance has improved with time This is most likely related to a viral illness that he had in July    Cardiology/Lung assessment has been negative his chest x-ray does not show any active disease Is office spirometry shows normal spirometry Patient has EGD and colonoscopy pending this week  Patient does have signs symptoms of excessive daytime sleepiness along with snoring with some shortness of breath at night with extreme fatigue he will need to be tested for sleep apnea  Signs and symptoms of reactive airways disease with asthma May consider methacholine challenge test at next office visit At this time he has no inhalers Patient has tried inhalers in the past but does not use them  #1 follow-up GI-EGD and colonoscopy pending #2 follow-up cardiology-patient with low heart rate has been on beta-blocker for many years I recommend that he call for reassessment #3 ENT referral chronic sinusitis and allergic rhinitis  #4 excessive daytime sleepiness with snoring patient will need home sleep test     MEDICATION ADJUSTMENTS/LABS AND TESTS ORDERED: Follow-up GI Follow-up cardiology ENT referral for chronic sinusitis Sleep test to assess for sleep apnea   CURRENT MEDICATIONS REVIEWED AT LENGTH WITH PATIENT TODAY   Patient  satisfied with Plan of action and management. All questions answered  Follow up 3-5 months  Total Time Spent 50 mins   Terry Franco, M.D.  [facility]  Medical Director [place] Director [facility]-P[facility] Department

## 2021-10-11 NOTE — Addendum Note (Signed)
Addended by: Claudette Head A on: 10/11/2021 02:47 PM   Modules accepted: Orders

## 2021-10-13 ENCOUNTER — Ambulatory Visit: Payer: Federal, State, Local not specified - PPO | Admitting: Registered Nurse

## 2021-10-13 ENCOUNTER — Ambulatory Visit
Admission: RE | Admit: 2021-10-13 | Discharge: 2021-10-13 | Disposition: A | Discharge status: Home / Self Care | Payer: Federal, State, Local not specified - PPO | Attending: Gastroenterology | Admitting: Gastroenterology

## 2021-10-13 ENCOUNTER — Encounter: Payer: Self-pay | Admitting: Gastroenterology

## 2021-10-13 ENCOUNTER — Encounter
Admission: RE | Disposition: A | Discharge status: Home / Self Care | Payer: Self-pay | Source: Home / Self Care | Attending: Gastroenterology

## 2021-10-13 DIAGNOSIS — Z801 Family history of malignant neoplasm of trachea, bronchus and lung: Secondary | ICD-10-CM | POA: Insufficient documentation

## 2021-10-13 DIAGNOSIS — R1319 Other dysphagia: Secondary | ICD-10-CM

## 2021-10-13 DIAGNOSIS — K2100 Gastro-esophageal reflux disease with esophagitis, without bleeding: Secondary | ICD-10-CM | POA: Insufficient documentation

## 2021-10-13 DIAGNOSIS — K635 Polyp of colon: Secondary | ICD-10-CM | POA: Diagnosis not present

## 2021-10-13 DIAGNOSIS — Z808 Family history of malignant neoplasm of other organs or systems: Secondary | ICD-10-CM | POA: Insufficient documentation

## 2021-10-13 DIAGNOSIS — D126 Benign neoplasm of colon, unspecified: Secondary | ICD-10-CM | POA: Diagnosis not present

## 2021-10-13 DIAGNOSIS — K317 Polyp of stomach and duodenum: Secondary | ICD-10-CM

## 2021-10-13 DIAGNOSIS — D120 Benign neoplasm of cecum: Secondary | ICD-10-CM | POA: Diagnosis not present

## 2021-10-13 DIAGNOSIS — Z1211 Encounter for screening for malignant neoplasm of colon: Secondary | ICD-10-CM

## 2021-10-13 DIAGNOSIS — K2270 Barrett's esophagus without dysplasia: Secondary | ICD-10-CM | POA: Diagnosis not present

## 2021-10-13 DIAGNOSIS — K2289 Other specified disease of esophagus: Secondary | ICD-10-CM | POA: Diagnosis not present

## 2021-10-13 DIAGNOSIS — R12 Heartburn: Secondary | ICD-10-CM

## 2021-10-13 DIAGNOSIS — K222 Esophageal obstruction: Secondary | ICD-10-CM

## 2021-10-13 HISTORY — PX: ESOPHAGOGASTRODUODENOSCOPY (EGD) WITH PROPOFOL: SHX5813

## 2021-10-13 HISTORY — PX: COLONOSCOPY WITH PROPOFOL: SHX5780

## 2021-10-13 SURGERY — ESOPHAGOGASTRODUODENOSCOPY (EGD) WITH PROPOFOL
Anesthesia: General

## 2021-10-13 MED ORDER — LIDOCAINE HCL (CARDIAC) PF 100 MG/5ML IV SOSY
PREFILLED_SYRINGE | INTRAVENOUS | Status: DC | PRN
  Administered 2021-10-13: 40 mg via INTRAVENOUS

## 2021-10-13 MED ORDER — PROPOFOL 10 MG/ML IV BOLUS
INTRAVENOUS | Status: DC | PRN
Start: 2021-10-13 — End: 2021-10-13
  Administered 2021-10-13: 10 mg via INTRAVENOUS
  Administered 2021-10-13: 80 mg via INTRAVENOUS

## 2021-10-13 MED ORDER — GLYCOPYRROLATE 0.2 MG/ML IJ SOLN
INTRAMUSCULAR | Status: AC
  Filled 2021-10-13: qty 1

## 2021-10-13 MED ORDER — DEXMEDETOMIDINE (PRECEDEX) IN NS 20 MCG/5ML (4 MCG/ML) IV SYRINGE
PREFILLED_SYRINGE | INTRAVENOUS | Status: DC | PRN
  Administered 2021-10-13: 4 ug via INTRAVENOUS
  Administered 2021-10-13: 12 ug via INTRAVENOUS

## 2021-10-13 MED ORDER — GLYCOPYRROLATE 0.2 MG/ML IJ SOLN
INTRAMUSCULAR | Status: DC | PRN
  Administered 2021-10-13 (×2): .2 mg via INTRAVENOUS

## 2021-10-13 MED ORDER — PROPOFOL 500 MG/50ML IV EMUL
INTRAVENOUS | Status: AC
  Filled 2021-10-13: qty 50

## 2021-10-13 MED ORDER — LIDOCAINE HCL (PF) 2 % IJ SOLN
INTRAMUSCULAR | Status: AC
  Filled 2021-10-13: qty 5

## 2021-10-13 MED ORDER — PROPOFOL 500 MG/50ML IV EMUL
INTRAVENOUS | Status: DC | PRN
  Administered 2021-10-13: 175 ug/kg/min via INTRAVENOUS

## 2021-10-13 MED ORDER — SODIUM CHLORIDE 0.9 % IV SOLN: INTRAVENOUS | Status: DC

## 2021-10-13 NOTE — Op Note (Signed)
[facility] Gastroenterology Patient Name: Draydon Clairmont Procedure Date: 10/13/2021 9:33 AM MRN: [id number] Account #: [id number] Date of Birth: 1961-05-27 Admit Type: Outpatient Age: 60 Room: [facility] ENDO ROOM 2 Gender: Male Note Status: Finalized Instrument Name: Jasper Riling [id number] Procedure:             Colonoscopy Indications:           Screening for colorectal malignant neoplasm Providers:             Jovante Hammitt B. Bonna Gains MD, MD Referring MD:          Forest Gleason Md, MD (Referring MD) Medicines:             Monitored Anesthesia Care Complications:         No immediate complications. Procedure:             Pre-Anesthesia Assessment:                        - ASA Grade Assessment: II - A patient with mild                         systemic disease.                        - Prior to the procedure, a History and Physical was                         performed, and patient medications, allergies and                         sensitivities were reviewed. The patient's tolerance                         of previous anesthesia was reviewed.                        - The risks and benefits of the procedure and the                         sedation options and risks were discussed with the                         patient. All questions were answered and informed                         consent was obtained.                        - Patient identification and proposed procedure were                         verified prior to the procedure by the physician, the                         nurse, the anesthesiologist, the anesthetist and the                         technician. The procedure was verified in the  procedure room.                        After obtaining informed consent, the colonoscope was                         passed under direct vision. Throughout the procedure,                         the patient's blood pressure, pulse, and oxygen                          saturations were monitored continuously. The                         Colonoscope was introduced through the anus and                         advanced to the the cecum, identified by appendiceal                         orifice and ileocecal valve. The colonoscopy was                         performed with ease. The patient tolerated the                         procedure well. The quality of the bowel preparation                         was fair. Findings:      The perianal and digital rectal examinations were normal.      A 7 mm polyp was found in the cecum. The polyp was sessile. The polyp       was removed with a cold snare. Resection and retrieval were complete.      The exam was otherwise without abnormality.      The rectum, sigmoid colon, descending colon, transverse colon, ascending       colon and cecum appeared normal.      The retroflexed view of the distal rectum and anal verge was normal and       showed no anal or rectal abnormalities. Impression:            - Preparation of the colon was fair.                        - One 7 mm polyp in the cecum, removed with a cold                         snare. Resected and retrieved.                        - The examination was otherwise normal.                        - The rectum, sigmoid colon, descending colon,                         transverse colon, ascending colon and cecum are normal.                        -  The distal rectum and anal verge are normal on                         retroflexion view. Recommendation:        - Await pathology results.                        - Discharge patient to home (with escort).                        - Advance diet as tolerated.                        - Continue present medications.                        - Repeat colonoscopy date to be determined after                         pending pathology results are reviewed because the                         bowel preparation was  suboptimal.                        - The findings and recommendations were discussed with                         the patient.                        - The findings and recommendations were discussed with                         the patient's family.                        - Return to primary care physician as previously                         scheduled. Procedure Code(s):     --- Professional ---                        [phone], Colonoscopy, flexible; with removal of                         tumor(s), polyp(s), or other lesion(s) by snare                         technique Diagnosis Code(s):     --- Professional ---                        Z12.11, Encounter for screening for malignant neoplasm                         of colon                        K63.5, Polyp of colon CPT copyright 2019 American Medical Association. All rights reserved. The codes documented in this report are preliminary and upon coder  review may  be revised to meet current compliance requirements.  Vonda Antigua, MD Margretta Sidle B. Bonna Gains MD, MD 10/13/2021 10:27:56 AM This report has been signed electronically. Number of Addenda: 0 Note Initiated On: 10/13/2021 9:33 AM Scope Withdrawal Time: 0 hours 12 minutes 50 seconds  Total Procedure Duration: 0 hours 16 minutes 46 seconds  Estimated Blood Loss:  Estimated blood loss: none.      [facility]

## 2021-10-13 NOTE — Anesthesia Postprocedure Evaluation (Signed)
Anesthesia Post Note  Patient: Terry Franco  Procedure(s) Performed: ESOPHAGOGASTRODUODENOSCOPY (EGD) WITH PROPOFOL COLONOSCOPY WITH PROPOFOL  Patient location during evaluation: PACU Anesthesia Type: General Level of consciousness: awake and oriented Pain management: pain level controlled Vital Signs Assessment: post-procedure vital signs reviewed and stable Respiratory status: spontaneous breathing and respiratory function stable Cardiovascular status: blood pressure returned to baseline Anesthetic complications: no   No notable events documented.   Last Vitals:  Vitals:   10/13/21 1031 10/13/21 1051  BP: (!) 141/89 138/68  Pulse: 73 68  Resp: 15 12  Temp:    SpO2: 99% 99%    Last Pain:  Vitals:   10/13/21 1031  TempSrc:   PainSc: 0-No pain                 VAN STAVEREN,Ginny Loomer

## 2021-10-13 NOTE — Op Note (Signed)
[facility] Gastroenterology Patient Name: Terry Franco Procedure Date: 10/13/2021 9:34 AM MRN: [id number] Account #: [id number] Date of Birth: 09-18-1961 Admit Type: Outpatient Age: 60 Room: [facility] ENDO ROOM 2 Gender: Male Note Status: Finalized Instrument Name: Upper Endoscope [id number] Procedure:             Upper GI endoscopy Indications:           Dysphagia, Heartburn, Follow-up of Barrett's esophagus Providers:             Nadeem Romanoski B. Bonna Gains MD, MD Referring MD:          Forest Gleason Md, MD (Referring MD) Medicines:             Monitored Anesthesia Care Complications:         No immediate complications. Procedure:             Pre-Anesthesia Assessment:                        - Prior to the procedure, a History and Physical was                         performed, and patient medications, allergies and                         sensitivities were reviewed. The patient's tolerance                         of previous anesthesia was reviewed.                        - The risks and benefits of the procedure and the                         sedation options and risks were discussed with the                         patient. All questions were answered and informed                         consent was obtained.                        - Patient identification and proposed procedure were                         verified prior to the procedure by the physician, the                         nurse, the anesthesiologist, the anesthetist and the                         technician. The procedure was verified in the                         procedure room.                        - ASA Grade Assessment: II - A patient with mild  systemic disease.                        After obtaining informed consent, the endoscope was                         passed under direct vision. Throughout the procedure,                         the patient's blood pressure, pulse, and  oxygen                         saturations were monitored continuously. The                         Endosonoscope was introduced through the mouth, and                         advanced to the second part of duodenum. The upper GI                         endoscopy was accomplished with ease. The patient                         tolerated the procedure well. Findings:      One benign-appearing, intrinsic mild stenosis was found. The stenosis       was traversed. A TTS dilator was passed through the scope. Dilation with       an 18-19-20 mm balloon dilator was performed to 20 mm. No heme effect or       tears were noted post dilation.      Two tongues of salmon-colored mucosa were present. The maximum       longitudinal extent of these esophageal mucosal changes was 1 cm in       length. Mucosa was biopsied with a cold forceps for histology in a       targeted manner and in 4 quadrants. The biopsies were taken after       dilation was performed to avoid tears from dilation at the biopsy sites.       Biopsies were obtained from the proximal and distal esophagus with cold       forceps for histology of suspected eosinophilic esophagitis.      The exam of the esophagus was otherwise normal.      The entire examined stomach was normal.      A single 6 mm sessile polyp was found in the duodenal bulb. Biopsies       were taken with a cold forceps for histology. One small biopsy was       taken. The rest of the polyp was left in place (to identify location in       case this is a precancerous/adenoma polyp on pathology) and the residual       polyp tissue left can be seen on the image as well.      The exam of the duodenum was otherwise normal. Impression:            - Benign-appearing esophageal stenosis. Dilated.                        - Salmon-colored mucosa classified as Barrett's stage  C0-M1 per Prague criteria. Biopsied.                        - Normal stomach.                         - A single duodenal polyp. Biopsied. Recommendation:        - Discharge patient to home (with escort).                        - Await pathology results.                        - Follow an antireflux regimen.                        - Advance diet as tolerated.                        - Continue present medications.                        - Patient has a contact number available for                         emergencies. The signs and symptoms of potential                         delayed complications were discussed with the patient.                         Return to normal activities tomorrow. Written                         discharge instructions were provided to the patient.                        - Discharge patient to home (with escort).                        - The findings and recommendations were discussed with                         the patient.                        - The findings and recommendations were discussed with                         the patient's family. Procedure Code(s):     --- Professional ---                        [phone], Esophagogastroduodenoscopy, flexible,                         transoral; with transendoscopic balloon dilation of                         esophagus (less than 30 mm diameter)  [id number], 59, Esophagogastroduodenoscopy, flexible,                         transoral; with biopsy, single or multiple Diagnosis Code(s):     --- Professional ---                        K22.2, Esophageal obstruction                        K22.70, Barrett's esophagus without dysplasia                        K31.7, Polyp of stomach and duodenum                        R13.10, Dysphagia, unspecified                        R12, Heartburn CPT copyright 2019 American Medical Association. All rights reserved. The codes documented in this report are preliminary and upon coder review may  be revised to meet current compliance requirements.  Vonda Antigua, MD Margretta Sidle B. Bonna Gains MD, MD 10/13/2021 10:21:55 AM This report has been signed electronically. Number of Addenda: 0 Note Initiated On: 10/13/2021 9:34 AM Estimated Blood Loss:  Estimated blood loss: none.      [facility]

## 2021-10-13 NOTE — Anesthesia Preprocedure Evaluation (Signed)
Anesthesia Evaluation  Patient identified by MRN, date of birth, ID band Patient awake    Reviewed: Allergy & Precautions, NPO status , Patient's Chart, lab work & pertinent test results  History of Anesthesia Complications (+) PONV  Airway Mallampati: II  TM Distance: >3 FB Neck ROM: full    Dental  (+) Teeth Intact   Pulmonary neg pulmonary ROS,    Pulmonary exam normal breath sounds clear to auscultation       Cardiovascular Exercise Tolerance: Good hypertension, Pt. on medications negative cardio ROS Normal cardiovascular exam Rhythm:Regular Rate:Normal     Neuro/Psych Anxiety Depression negative neurological ROS  negative psych ROS   GI/Hepatic negative GI ROS, Neg liver ROS, GERD  ,  Endo/Other  negative endocrine ROS  Renal/GU negative Renal ROS  negative genitourinary   Musculoskeletal  (+) Arthritis ,   Abdominal Normal abdominal exam  (+)   Peds negative pediatric ROS (+)  Hematology negative hematology ROS (+)   Anesthesia Other Findings Past Medical History: No date: Allergy No date: Anxiety No date: Arthritis 02/17/2020: Atypical mole     Comment:  R epigastric/excision No date: Depression 05/26/2021: Dysplastic nevus     Comment:  left prox tricep at the axillary fold - severe - margins              free but close, recheck 12/2021 No date: GERD (gastroesophageal reflux disease) No date: Herpes simplex infection     Comment:  type 1 No date: Hyperlipidemia No date: Hypertension  Past Surgical History: No date: COLONOSCOPY WITH PROPOFOL No date: HERNIA REPAIR  BMI    Body Mass Index: 25.40 kg/m      Reproductive/Obstetrics negative OB ROS                             Anesthesia Physical Anesthesia Plan  ASA: 2  Anesthesia Plan: General   Post-op Pain Management:    Induction: Intravenous  PONV Risk Score and Plan: Propofol infusion and  TIVA  Airway Management Planned: Nasal Cannula  Additional Equipment:   Intra-op Plan:   Post-operative Plan:   Informed Consent: I have reviewed the patients History and Physical, chart, labs and discussed the procedure including the risks, benefits and alternatives for the proposed anesthesia with the patient or authorized representative who has indicated his/her understanding and acceptance.     Dental Advisory Given  Plan Discussed with: CRNA and Surgeon  Anesthesia Plan Comments: (Patient consented for risks of anesthesia including but not limited to:  - adverse reactions to medications - risk of airway placement if required - damage to eyes, teeth, lips or other oral mucosa - nerve damage due to positioning  - sore throat or hoarseness - Damage to heart, brain, nerves, lungs, other parts of body or loss of life  Patient voiced understanding.)        Anesthesia Quick Evaluation

## 2021-10-13 NOTE — H&P (Signed)
Vonda Antigua, MD [address] [place], [place], [place], [address] [id number], [place], [place], [place], [address] Phone: [phone]  Fax: [phone]  Primary Care Physician:  Gwyneth Sprout, FNP   Pre-Procedure History & Physical: HPI:  Terry Franco is a 60 y.o. male is here for a colonoscopy and EGD.   Past Medical History:  Diagnosis Date   Allergy    Anxiety    Arthritis    Atypical mole 02/17/2020   R epigastric/excision   Depression    Dysplastic nevus 05/26/2021   left prox tricep at the axillary fold - severe - margins free but close, recheck 12/2021   GERD (gastroesophageal reflux disease)    Herpes simplex infection    type 1   Hyperlipidemia    Hypertension     Past Surgical History:  Procedure Laterality Date   COLONOSCOPY WITH PROPOFOL     HERNIA REPAIR      Prior to Admission medications   Medication Sig Start Date End Date Taking? Authorizing Provider  amLODipine (NORVASC) 5 MG tablet Take 5 mg by mouth daily. 08/15/21  Yes [provider]  losartan-hydrochlorothiazide (HYZAAR) 100-25 MG per tablet Take 1 tablet by mouth daily.  12/30/14  Yes [provider]  metoprolol succinate (TOPROL-XL) 25 MG 24 hr tablet TAKE 1 TABLET(25 MG) BY MOUTH DAILY 12/21/19  Yes Pollak, Adriana M, PA-C  pantoprazole (PROTONIX) 40 MG tablet Take 1 tablet by mouth 2 (two) times daily. 08/19/21  Yes [provider]  rosuvastatin (CRESTOR) 10 MG tablet Take 1 tablet (10 mg total) by mouth daily. 08/21/21  Yes Gwyneth Sprout, FNP  albuterol (VENTOLIN HFA) 108 (90 Base) MCG/ACT inhaler Inhale 2 puffs into the lungs every 6 (six) hours as needed for wheezing or shortness of breath. 08/08/21   Chrismon, Vickki Muff, PA-C  budesonide-formoterol (SYMBICORT) 80-4.5 MCG/ACT inhaler Inhale 2 puffs into the lungs 2 (two) times daily. 08/19/21   Gwyneth Sprout, FNP  co-enzyme Q-10 30 MG capsule Take 1 capsule (30 mg total) by mouth 3 (three) times daily.  08/21/21   Gwyneth Sprout, FNP  loratadine (CLARITIN) 10 MG tablet Take 10 mg by mouth daily.    [provider]  meloxicam (MOBIC) 15 MG tablet Take 15 mg by mouth daily. 08/15/21   [provider]  methocarbamol (ROBAXIN) 750 MG tablet Take 750 mg by mouth 3 (three) times daily.    [provider]  pantoprazole (PROTONIX) 40 MG tablet Take 1 tablet (40 mg total) by mouth 2 (two) times daily. Patient not taking: Reported on 10/11/2021 08/19/21   Gwyneth Sprout, FNP  sucralfate (CARAFATE) 1 GM/10ML suspension Take 10 mLs (1 g total) by mouth 4 (four) times daily -  with meals and at bedtime. 08/19/21   Gwyneth Sprout, FNP  triamcinolone (NASACORT) 55 MCG/ACT AERO nasal inhaler Place 2 sprays into the nose daily.    [provider]    Allergies as of 10/03/2021 - Review Complete 10/03/2021  Allergen Reaction Noted   Ibuprofen Swelling 06/04/2015   Versed [midazolam] Nausea And Vomiting 03/21/2016   Penicillin v potassium Rash and Other (See Comments) 06/04/2015    Family History  Problem Relation Age of Onset   Cancer Mother        melanoma   Cancer Father        lung cancer   Heart disease Father    Benign prostatic hyperplasia Father    Prostate cancer Paternal Uncle  Benign prostatic hyperplasia Brother    Urolithiasis Brother    Kidney cancer Neg Hx    Kidney disease Neg Hx     Social History   Socioeconomic History   Marital status: Married    Spouse name: Not on file   Number of children: Not on file   Years of education: Not on file   Highest education level: Not on file  Occupational History   Not on file  Tobacco Use   Smoking status: Never   Smokeless tobacco: Never  Vaping Use   Vaping Use: Never used  Substance and Sexual Activity   Alcohol use: Yes    Alcohol/week: 0.0 standard drinks   Drug use: No   Sexual activity: Not on file  Other Topics Concern   Not on file  Social History Narrative   Not on file   Social  Determinants of Health   Financial Resource Strain: Not on file  Food Insecurity: Not on file  Transportation Needs: Not on file  Physical Activity: Not on file  Stress: Not on file  Social Connections: Not on file  Intimate Partner Violence: Not on file    Review of Systems: See HPI, otherwise negative ROS  Constitutional: General:   Alert,  Well-developed, well-nourished, pleasant and cooperative in NAD BP 117/79   Pulse 73   Temp (!) 97.2 F (36.2 C) (Temporal)   Resp 18   Ht 5\' 9"  (1.753 m)   Wt 78 kg   SpO2 99%   BMI 25.40 kg/m   Head: Normocephalic, atraumatic.   Eyes:  Sclera clear, no icterus.   Conjunctiva pink.   Mouth:  No deformity or lesions, oropharynx pink & moist.  Neck:  Supple, trachea midline  Respiratory: Normal respiratory effort  Gastrointestinal:  Soft, non-tender and non-distended without masses, hepatosplenomegaly or hernias noted.  No guarding or rebound tenderness.     Cardiac: No clubbing or edema.  No cyanosis. Normal posterior tibial pedal pulses noted.  Lymphatic:  No significant cervical adenopathy.  Psych:  Alert and cooperative. Normal mood and affect.  Musculoskeletal:   Symmetrical without gross deformities. 5/5 Lower extremity strength bilaterally.  Skin: Warm. Intact without significant lesions or rashes. No jaundice.  Neurologic:  Face symmetrical, tongue midline, Normal sensation to touch;  grossly normal neurologically.  Psych:  Alert and oriented x3, Alert and cooperative. Normal mood and affect.  Impression/Plan: Terry Franco is here for a colonoscopy to be performed for average risk screening and EGD for Acid Reflux, dysphagia.  Risks, benefits, limitations, and alternatives regarding the procedures have been reviewed with the patient.  Questions have been answered.  All parties agreeable.   Virgel Manifold, MD  10/13/2021, 9:31 AM

## 2021-10-13 NOTE — Transfer of Care (Signed)
Immediate Anesthesia Transfer of Care Note  Patient: Terry Franco  Procedure(s) Performed: Procedure(s): ESOPHAGOGASTRODUODENOSCOPY (EGD) WITH PROPOFOL (N/A) COLONOSCOPY WITH PROPOFOL (N/A)  Patient Location: PACU and Endoscopy Unit  Anesthesia Type:General  Level of Consciousness: sedated  Airway & Oxygen Therapy: Patient Spontanous Breathing and Patient connected to nasal cannula oxygen  Post-op Assessment: Report given to RN and Post -op Vital signs reviewed and stable  Post vital signs: Reviewed and stable  Last Vitals:  Vitals:   10/13/21 0916 10/13/21 1021  BP: 117/79 128/83  Pulse: 73 77  Resp: 18 16  Temp: (!) 36.2 C (!) 35.6 C  SpO2: 48% 83%    Complications: No apparent anesthesia complications

## 2021-10-14 LAB — SURGICAL PATHOLOGY

## 2021-10-18 ENCOUNTER — Encounter: Payer: Self-pay | Admitting: Gastroenterology

## 2021-10-19 ENCOUNTER — Telehealth: Payer: Self-pay | Admitting: Gastroenterology

## 2021-10-19 ENCOUNTER — Telehealth: Payer: Self-pay | Admitting: Internal Medicine

## 2021-10-19 NOTE — Telephone Encounter (Signed)
Pt. Requesting a call back to discuss results of colonoscopy. He says he read the results but did not understand what it means going forward.

## 2021-10-19 NOTE — Telephone Encounter (Signed)
Spoke to patient, who stated that he does not wish to do HST at this time. He does not have issues with sleep.  Routing to [place] and Dr. Mortimer Fries as an Juluis Rainier.

## 2021-10-20 NOTE — Telephone Encounter (Signed)
I spoke to pt and went over results and answered questions

## 2021-11-03 ENCOUNTER — Ambulatory Visit: Payer: Federal, State, Local not specified - PPO

## 2021-11-04 ENCOUNTER — Ambulatory Visit: Payer: Federal, State, Local not specified - PPO | Attending: Internal Medicine

## 2021-11-04 DIAGNOSIS — Z23 Encounter for immunization: Secondary | ICD-10-CM

## 2021-11-04 NOTE — Progress Notes (Signed)
   Covid-19 Vaccination Clinic  Name:  Terry Franco    MRN: [id number] DOB: 07-07-1961  11/04/2021  [name] was observed post Covid-19 immunization for 15 minutes without incident. He was provided with Vaccine Information Sheet and instruction to access the V-Safe system.   [name] was instructed to call 911 with any severe reactions post vaccine: Difficulty breathing  Swelling of face and throat  A fast heartbeat  A bad rash all over body  Dizziness and weakness   Immunizations Administered     Name Date Dose VIS Date Route   Pfizer Covid-19 Vaccine Bivalent Booster 11/04/2021 10:04 AM 0.3 mL 08/17/2021 Intramuscular   Manufacturer: [place]   Lot: [id number]   [place]: [place], PharmD, MBA Clinical Acute Care Pharmacist

## 2021-11-08 ENCOUNTER — Other Ambulatory Visit: Payer: Self-pay

## 2021-11-08 MED ORDER — PFIZER COVID-19 VAC BIVALENT 30 MCG/0.3ML IM SUSP
INTRAMUSCULAR | 0 refills | Status: DC
  Filled 2021-11-08: qty 0.3, 1d supply, fill #0

## 2021-11-09 DIAGNOSIS — K219 Gastro-esophageal reflux disease without esophagitis: Secondary | ICD-10-CM | POA: Diagnosis not present

## 2021-11-09 DIAGNOSIS — J301 Allergic rhinitis due to pollen: Secondary | ICD-10-CM | POA: Diagnosis not present

## 2021-11-21 ENCOUNTER — Telehealth: Payer: Self-pay | Admitting: Family Medicine

## 2021-11-21 NOTE — Telephone Encounter (Signed)
Pt is calling to get some medication advice. Pt went to (Dr. [facility] Antig[facility]) stated that the medication pantoprazole (PROTONIX) 40 MG tablet [[id number]]prescribed by Daneil Dan was a strong dose and not meant to be prescribed long term. 10/13/21 in recovery Dr. Daphane Shepherd told the patient to continue taking the medication. Is the patinent ok to continue to take the pantoprazole 40mg   CB- [phone]

## 2021-11-21 NOTE — Telephone Encounter (Signed)
Pt is calling to ask if he can get his 2nd shingles shot at [facility].  Pt got the first shingles shot on 08/19/21 wanting to know is it ok to get the second dose [phone]

## 2021-11-22 ENCOUNTER — Other Ambulatory Visit: Payer: Self-pay | Admitting: Family Medicine

## 2021-11-22 DIAGNOSIS — R053 Chronic cough: Secondary | ICD-10-CM

## 2021-11-22 MED ORDER — PANTOPRAZOLE SODIUM 40 MG PO TBEC: 40.0000 mg | DELAYED_RELEASE_TABLET | Freq: Every day | ORAL | 0 refills | Status: DC

## 2021-11-22 NOTE — Telephone Encounter (Signed)
Okay to received 2nd shingrix can schedule with any provider, left message for patient to call back office to schedule. KW

## 2021-11-22 NOTE — Telephone Encounter (Signed)
Patient advised.KW 

## 2021-11-22 NOTE — Telephone Encounter (Signed)
Please review and advise. KW 

## 2021-11-22 NOTE — Telephone Encounter (Signed)
Patient was calling back to schedule his second shingles vaccine. Requesting a call back at [phone].

## 2021-11-23 ENCOUNTER — Other Ambulatory Visit: Payer: Self-pay

## 2021-11-23 ENCOUNTER — Ambulatory Visit (INDEPENDENT_AMBULATORY_CARE_PROVIDER_SITE_OTHER): Payer: Federal, State, Local not specified - PPO | Admitting: Family Medicine

## 2021-11-23 DIAGNOSIS — Z23 Encounter for immunization: Secondary | ICD-10-CM

## 2021-11-23 NOTE — Progress Notes (Signed)
Presents for shingles vaccine. No charge for encounter.

## 2021-11-23 NOTE — Telephone Encounter (Signed)
Patient received shingles vaccine today. KW

## 2021-11-24 DIAGNOSIS — J301 Allergic rhinitis due to pollen: Secondary | ICD-10-CM | POA: Diagnosis not present

## 2021-12-22 ENCOUNTER — Other Ambulatory Visit: Payer: Self-pay

## 2022-01-03 ENCOUNTER — Other Ambulatory Visit: Payer: Self-pay

## 2022-01-03 ENCOUNTER — Ambulatory Visit: Payer: Federal, State, Local not specified - PPO | Admitting: Dermatology

## 2022-01-03 ENCOUNTER — Encounter: Payer: Self-pay | Admitting: Dermatology

## 2022-01-03 DIAGNOSIS — Z86018 Personal history of other benign neoplasm: Secondary | ICD-10-CM | POA: Diagnosis not present

## 2022-01-03 DIAGNOSIS — L2089 Other atopic dermatitis: Secondary | ICD-10-CM | POA: Diagnosis not present

## 2022-01-03 DIAGNOSIS — L918 Other hypertrophic disorders of the skin: Secondary | ICD-10-CM

## 2022-01-03 MED ORDER — FLUOCINOLONE ACETONIDE BODY 0.01 % EX OIL
TOPICAL_OIL | CUTANEOUS | 3 refills | Status: DC
Start: 2022-01-03 — End: 2022-06-01

## 2022-01-03 MED ORDER — FLUOCINOLONE ACETONIDE 0.01 % OT OIL: 1.0000 "application " | TOPICAL_OIL | Freq: Every day | OTIC | 1 refills | Status: DC

## 2022-01-03 NOTE — Patient Instructions (Signed)
Gentle Skin Care Guide  1. Bathe no more than once a day.  2. Avoid bathing in hot water  3. Use a mild soap like Dove, Vanicream, Cetaphil, CeraVe. Can use Lever 2000 or Cetaphil antibacterial soap  4. Use soap only where you need it. On most days, use it under your arms, between your legs, and on your feet. Let the water rinse other areas unless visibly dirty.  5. When you get out of the bath/shower, use a towel to gently blot your skin dry, don't rub it.  6. While your skin is still a little damp, apply a moisturizing cream such as Vanicream, CeraVe, Cetaphil, Eucerin, Sarna lotion or plain Vaseline Jelly. For hands apply Neutrogena [place] Hand Cream or Excipial Hand Cream.  7. Reapply moisturizer any time you start to itch or feel dry.  8. Sometimes using free and clear laundry detergents can be helpful. Fabric softener sheets should be avoided. Downy Free & Gentle liquid, or any liquid fabric softener that is free of dyes and perfumes, it acceptable to use  9. If your doctor has given you prescription creams you may apply moisturizers over them    If You Need Anything After Your Visit  If you have any questions or concerns for your doctor, please call our main line at [phone] and press option 4 to reach your doctor's medical assistant. If no one answers, please leave a voicemail as directed and we will return your call as soon as possible. Messages left after 4 pm will be answered the following business day.   You may also send [place] a message via [place]. We typically respond to MyChart messages within 1-2 business days.  For prescription refills, please ask your pharmacy to contact our office. Our fax number is [phone].  If you have an urgent issue when the clinic is closed that cannot wait until the next business day, you can page your doctor at the number below.    Please note that while we do our best to be available for urgent issues outside of office hours, we are  not available 24/7.   If you have an urgent issue and are unable to reach [place], you may choose to seek medical care at your doctor's office, retail clinic, urgent care center, or emergency room.  If you have a medical emergency, please immediately call 911 or go to the emergency department.  Pager Numbers  - Dr. Nehemiah Massed: [phone]  - Dr. Laurence Ferrari: [phone]  - Dr. Nicole Kindred: [phone]  In the event of inclement weather, please call our main line at [phone] for an update on the status of any delays or closures.  Dermatology Medication Tips: Please keep the boxes that topical medications come in in order to help keep track of the instructions about where and how to use these. Pharmacies typically print the medication instructions only on the boxes and not directly on the medication tubes.   If your medication is too expensive, please contact our office at [phone] option 4 or send [place] a message through [place].   We are unable to tell what your co-pay for medications will be in advance as this is different depending on your insurance coverage. However, we may be able to find a substitute medication at lower cost or fill out paperwork to get insurance to cover a needed medication.   If a prior authorization is required to get your medication covered by your insurance company, please allow [place] 1-2 business days to complete this  process.  Drug prices often vary depending on where the prescription is filled and some pharmacies may offer cheaper prices.  The website www.goodrx.com contains coupons for medications through different pharmacies. The prices here do not account for what the cost may be with help from insurance (it may be cheaper with your insurance), but the website can give you the price if you did not use any insurance.  - You can print the associated coupon and take it with your prescription to the pharmacy.  - You may also stop by our office during regular business  hours and pick up a GoodRx coupon card.  - If you need your prescription sent electronically to a different pharmacy, notify our office through [facility] or by phone at [phone] option 4.     Si Usted Necesita Algo Despus de Su Visita  Tambin puede enviarnos un mensaje a travs de Pharmacist, community. Por lo general respondemos a los mensajes de MyChart en el transcurso de 1 a 2 das hbiles.  Para renovar recetas, por favor pida a su farmacia que se ponga en contacto con nuestra oficina. Harland Dingwall de fax es [place] [phone].  Si tiene un asunto urgente cuando la clnica est cerrada y que no puede esperar hasta el siguiente da hbil, puede llamar/localizar a su doctor(a) al nmero que aparece a continuacin.   Por favor, tenga en cuenta que aunque hacemos todo lo posible para estar disponibles para asuntos urgentes fuera del horario de [place], no estamos disponibles las 24 horas del da, los 7 das de la [place].   Si tiene un problema urgente y no puede comunicarse con nosotros, puede optar por buscar atencin mdica  en el consultorio de su doctor(a), en una clnica privada, en un centro de atencin urgente o en una sala de emergencias.  Si tiene Engineering geologist, por favor llame inmediatamente al 911 o vaya a la sala de emergencias.  Nmeros de bper  - Dr. Nehemiah Massed: [phone]  - Dra. Moye: [phone]  - Dra. Nicole Kindred: [phone]  En caso de inclemencias del [place], por favor llame a Johnsie Kindred principal al [phone] para una actualizacin sobre el [place] de cualquier retraso o cierre.  Consejos para la medicacin en dermatologa: Por favor, guarde las cajas en las que vienen los medicamentos de uso tpico para ayudarle a seguir las instrucciones sobre dnde y cmo usarlos. Las farmacias generalmente imprimen las instrucciones del medicamento slo en las cajas y no directamente en los tubos del [place].   Si su medicamento es muy caro, por favor,  pngase en contacto con Zigmund Daniel llamando al [phone] y presione la opcin 4 o envenos un mensaje a travs de Pharmacist, community.   No podemos decirle cul ser su copago por los medicamentos por adelantado ya que esto es diferente dependiendo de la cobertura de su seguro. Sin embargo, es posible que podamos encontrar un medicamento sustituto a Electrical engineer un formulario para que el seguro cubra el medicamento que se considera necesario.   Si se requiere una autorizacin previa para que su compaa de seguros [place] su medicamento, por favor permtanos de 1 a 2 das hbiles para completar este proceso.  Los precios de los medicamentos varan con frecuencia dependiendo del Environmental consultant de dnde se surte la receta y alguna farmacias pueden ofrecer precios ms baratos.  El sitio web www.goodrx.com tiene cupones para medicamentos de Airline pilot. Los precios aqu no tienen en cuenta lo que podra costar con la ayuda del seguro (puede  ser ms barato con su seguro), pero el sitio web puede darle el precio si no Field seismologist.  - Puede imprimir el cupn correspondiente y llevarlo con su receta a la farmacia.  - Tambin puede pasar por nuestra oficina durante el horario de atencin regular y Charity fundraiser una tarjeta de cupones de GoodRx.  - Si necesita que su receta se enve electrnicamente a una farmacia diferente, informe a nuestra oficina a travs de MyChart de [place] o por telfono llamando al [phone] y presione la opcin 4.

## 2022-01-03 NOTE — Progress Notes (Signed)
° °  Follow-Up Visit   Subjective  Terry Franco is a 61 y.o. male who presents for the following: Recheck dysplastic nevus (Bx proven moderate to severe of the  L prox tricep of the lat axillary fold. Patient is here today for recheck to make sure nevus doesn't need an additional procedure.). Patient c/o itchy rash on the trunk that is worse in the winter and would like to discuss treatment options today.   The following portions of the chart were reviewed this encounter and updated as appropriate:   Tobacco   Allergies   Meds   Problems   Med Hx   Surg Hx   Fam Hx      Review of Systems:  No other skin or systemic complaints except as noted in HPI or Assessment and Plan.  Objective  Well appearing patient in no apparent distress; mood and affect are within normal limits.  A focused examination was performed including the trunk and extremities. Relevant physical exam findings are noted in the Assessment and Plan.  Trunk, extremities Scaly pink patches.   L prox tricep of the lat axillary fold Clear.   Assessment & Plan  Atopic dermatitis Trunk, extremities Atopic dermatitis (eczema) is a chronic, relapsing, pruritic condition that can significantly affect quality of life. It is often associated with allergic rhinitis and/or asthma and can require treatment with topical medications, phototherapy, or in severe cases biologic injectable medication (Dupixent; Adbry) or Oral JAK inhibitors.  Start Derma-Smooth oil QD PRN after bathing and while skin is still damp.   Fluocinolone Acetonide Body 0.01 % OIL - Trunk, extremities Apply to damp skin after showering QD PRN.  History of dysplastic nevus L prox tricep of the lat axillary fold Bx proven moderate to severe - Clear. Observe for recurrence. Call clinic for new or changing lesions.  Recommend regular skin exams, daily broad-spectrum spf 30+ sunscreen use, and photoprotection.    Acrochordons (Skin Tags) - Fleshy, skin-colored  pedunculated papules - Benign appearing.  - Observe. - If desired, they can be removed with an in office procedure that is not covered by insurance. - Please call the clinic if you notice any new or changing lesions.  Return for appointment as scheduled.  Luther Redo, CMA, am acting as scribe for Sarina Ser, MD . Documentation: I have reviewed the above documentation for accuracy and completeness, and I agree with the above.  Sarina Ser, MD

## 2022-01-04 ENCOUNTER — Telehealth: Payer: Self-pay

## 2022-01-04 NOTE — Telephone Encounter (Signed)
-----   Message from Ralene Bathe, MD sent at 01/03/2022  7:12 PM EST ----- Contact patient and make sure he was able to get the Derma-Smoothe FS oil from manufacturer. Also ask him to call us after using a couple weeks to make sure he is improving.

## 2022-01-04 NOTE — Telephone Encounter (Signed)
Patient states that he was able to get the Derma-Smoothe from the manufacturer for $40. Advised patient to contact us in a few weeks to report condition.

## 2022-01-09 DIAGNOSIS — K080 Exfoliation of teeth due to systemic causes: Secondary | ICD-10-CM | POA: Diagnosis not present

## 2022-05-22 ENCOUNTER — Telehealth: Payer: Self-pay

## 2022-05-22 ENCOUNTER — Other Ambulatory Visit: Payer: Self-pay

## 2022-05-22 DIAGNOSIS — Z86010 Personal history of colonic polyps: Secondary | ICD-10-CM

## 2022-05-22 MED ORDER — GOLYTELY 236 G PO SOLR: 4000.0000 mL | Freq: Every day | ORAL | 0 refills | Status: DC

## 2022-05-22 NOTE — Telephone Encounter (Signed)
Gastroenterology Pre-Procedure Review  Request Date: 10/10/22 Requesting Physician: Dr. Allen Norris  PATIENT REVIEW QUESTIONS: The patient responded to the following health history questions as indicated:    1. Are you having any GI issues? no 2. Do you have a personal history of Polyps? yes (last colonoscopy performed by Dr. Bonna Gains 10/13/21 13m sessile polyp noted ) Pt stated that Dr. TBonna Gainsrecommended 2 day prep for repeat 3. Do you have a family history of Colon Cancer or Polyps? yes (brother polyps, uncles (2) colon cancer) 4. Diabetes Mellitus? no 5. Joint replacements in the past 12 months?no 6. Major health problems in the past 3 months?no 7. Any artificial heart valves, MVP, or defibrillator?no    MEDICATIONS & ALLERGIES:    Patient reports the following regarding taking any anticoagulation/antiplatelet therapy:   Plavix, Coumadin, Eliquis, Xarelto, Lovenox, Pradaxa, Brilinta, or Effient? no Aspirin? no  Patient confirms/reports the following medications:  Current Outpatient Medications  Medication Sig Dispense Refill   albuterol (VENTOLIN HFA) 108 (90 Base) MCG/ACT inhaler Inhale 2 puffs into the lungs every 6 (six) hours as needed for wheezing or shortness of breath. (Patient not taking: Reported on 01/03/2022) 8 g 0   amLODipine (NORVASC) 5 MG tablet Take 5 mg by mouth daily.     budesonide-formoterol (SYMBICORT) 80-4.5 MCG/ACT inhaler Inhale 2 puffs into the lungs 2 (two) times daily. (Patient not taking: Reported on 01/03/2022) 1 each 3   co-enzyme Q-10 30 MG capsule Take 1 capsule (30 mg total) by mouth 3 (three) times daily. 270 capsule 3   COVID-19 mRNA bivalent vaccine, Pfizer, (PFIZER COVID-19 VAC BIVALENT) injection Inject into the muscle. 0.3 mL 0   Fluocinolone Acetonide Body 0.01 % OIL Apply to damp skin after showering QD PRN. 120 mL 3   loratadine (CLARITIN) 10 MG tablet Take 10 mg by mouth daily.     losartan-hydrochlorothiazide (HYZAAR) 100-25 MG per tablet Take  1 tablet by mouth daily.      meloxicam (MOBIC) 15 MG tablet Take 15 mg by mouth daily.     methocarbamol (ROBAXIN) 750 MG tablet Take 750 mg by mouth 3 (three) times daily.     metoprolol succinate (TOPROL-XL) 25 MG 24 hr tablet TAKE 1 TABLET(25 MG) BY MOUTH DAILY 90 tablet 1   pantoprazole (PROTONIX) 40 MG tablet Take 1 tablet (40 mg total) by mouth daily. 45 tablet 0   rosuvastatin (CRESTOR) 10 MG tablet Take 1 tablet (10 mg total) by mouth daily. 90 tablet 3   sucralfate (CARAFATE) 1 GM/10ML suspension Take 10 mLs (1 g total) by mouth 4 (four) times daily -  with meals and at bedtime. (Patient not taking: Reported on 01/03/2022) 414 mL 1   triamcinolone (NASACORT) 55 MCG/ACT AERO nasal inhaler Place 2 sprays into the nose daily.     No current facility-administered medications for this visit.    Patient confirms/reports the following allergies:  Allergies  Allergen Reactions   Ibuprofen Swelling   Versed [Midazolam] Nausea And Vomiting   Penicillin V Potassium Rash and Other (See Comments)    Has patient had a PCN reaction causing immediate rash, facial/tongue/throat swelling, SOB or lightheadedness with hypotension: unknown Has patient had a PCN reaction causing severe rash involving mucus membranes or skin necrosis: unknown Has patient had a PCN reaction that required hospitalization No Has patient had a PCN reaction occurring within the last 10 years: No If all of the above answers are "NO", then may proceed with Cephalosporin use.  No orders of the defined types were placed in this encounter.   AUTHORIZATION INFORMATION Primary Insurance: 1D#: Group #:  Secondary Insurance: 1D#: Group #:  SCHEDULE INFORMATION: Date: 10/10/22 Time: Location: [facility]

## 2022-05-24 DIAGNOSIS — M545 Low back pain, unspecified: Secondary | ICD-10-CM | POA: Diagnosis not present

## 2022-05-24 DIAGNOSIS — M5136 Other intervertebral disc degeneration, lumbar region: Secondary | ICD-10-CM | POA: Diagnosis not present

## 2022-05-24 DIAGNOSIS — M461 Sacroiliitis, not elsewhere classified: Secondary | ICD-10-CM | POA: Diagnosis not present

## 2022-05-24 DIAGNOSIS — R1030 Lower abdominal pain, unspecified: Secondary | ICD-10-CM | POA: Diagnosis not present

## 2022-05-24 DIAGNOSIS — R1031 Right lower quadrant pain: Secondary | ICD-10-CM | POA: Diagnosis not present

## 2022-05-29 DIAGNOSIS — M6281 Muscle weakness (generalized): Secondary | ICD-10-CM | POA: Diagnosis not present

## 2022-06-01 ENCOUNTER — Ambulatory Visit (INDEPENDENT_AMBULATORY_CARE_PROVIDER_SITE_OTHER): Payer: Federal, State, Local not specified - PPO | Admitting: Dermatology

## 2022-06-01 DIAGNOSIS — L209 Atopic dermatitis, unspecified: Secondary | ICD-10-CM

## 2022-06-01 DIAGNOSIS — D1800 Hemangioma unspecified site: Secondary | ICD-10-CM

## 2022-06-01 DIAGNOSIS — L814 Other melanin hyperpigmentation: Secondary | ICD-10-CM | POA: Diagnosis not present

## 2022-06-01 DIAGNOSIS — L578 Other skin changes due to chronic exposure to nonionizing radiation: Secondary | ICD-10-CM

## 2022-06-01 DIAGNOSIS — D229 Melanocytic nevi, unspecified: Secondary | ICD-10-CM

## 2022-06-01 DIAGNOSIS — Z86018 Personal history of other benign neoplasm: Secondary | ICD-10-CM | POA: Diagnosis not present

## 2022-06-01 DIAGNOSIS — L2089 Other atopic dermatitis: Secondary | ICD-10-CM

## 2022-06-01 DIAGNOSIS — Z1283 Encounter for screening for malignant neoplasm of skin: Secondary | ICD-10-CM

## 2022-06-01 DIAGNOSIS — D2372 Other benign neoplasm of skin of left lower limb, including hip: Secondary | ICD-10-CM

## 2022-06-01 DIAGNOSIS — M6281 Muscle weakness (generalized): Secondary | ICD-10-CM | POA: Diagnosis not present

## 2022-06-01 DIAGNOSIS — L821 Other seborrheic keratosis: Secondary | ICD-10-CM

## 2022-06-01 MED ORDER — FLUOCINOLONE ACETONIDE BODY 0.01 % EX OIL: TOPICAL_OIL | CUTANEOUS | 3 refills | Status: AC

## 2022-06-01 NOTE — Patient Instructions (Signed)
     Melanoma ABCDEs  Melanoma is the most dangerous type of skin cancer, and is the leading cause of death from skin disease.  You are more likely to develop melanoma if you: Have light-colored skin, light-colored eyes, or red or blond hair Spend a lot of time in the sun Tan regularly, either outdoors or in a tanning bed Have had blistering sunburns, especially during childhood Have a close family member who has had a melanoma Have atypical moles or large birthmarks  Early detection of melanoma is key since treatment is typically straightforward and cure rates are extremely high if we catch it early.   The first sign of melanoma is often a change in a mole or a new dark spot.  The ABCDE system is a way of remembering the signs of melanoma.  A for asymmetry:  The two halves do not match. B for border:  The edges of the growth are irregular. C for color:  A mixture of colors are present instead of an even brown color. D for diameter:  Melanomas are usually (but not always) greater than 6mm - the size of a pencil eraser. E for evolution:  The spot keeps changing in size, shape, and color.  Please check your skin once per month between visits. You can use a small mirror in front and a large mirror behind you to keep an eye on the back side or your body.   If you see any new or changing lesions before your next follow-up, please call to schedule a visit.  Please continue daily skin protection including broad spectrum sunscreen SPF 30+ to sun-exposed areas, reapplying every 2 hours as needed when you're outdoors.   Staying in the shade or wearing long sleeves, sun glasses (UVA+UVB protection) and wide brim hats (4-inch brim around the entire circumference of the hat) are also recommended for sun protection.    Due to recent changes in healthcare laws, you may see results of your pathology and/or laboratory studies on MyChart before the doctors have had a chance to review them. We  understand that in some cases there may be results that are confusing or concerning to you. Please understand that not all results are received at the same time and often the doctors may need to interpret multiple results in order to provide you with the best plan of care or course of treatment. Therefore, we ask that you please give us 2 business days to thoroughly review all your results before contacting the office for clarification. Should we see a critical lab result, you will be contacted sooner.   If You Need Anything After Your Visit  If you have any questions or concerns for your doctor, please call our main line at [phone] and press option 4 to reach your doctor's medical assistant. If no one answers, please leave a voicemail as directed and we will return your call as soon as possible. Messages left after 4 pm will be answered the following business day.   You may also send us a message via MyChart. We typically respond to MyChart messages within 1-2 business days.  For prescription refills, please ask your pharmacy to contact our office. Our fax number is [phone].  If you have an urgent issue when the clinic is closed that cannot wait until the next business day, you can page your doctor at the number below.    Please note that while we do our best to be available for urgent issues   outside of office hours, we are not available 24/7.   If you have an urgent issue and are unable to reach us, you may choose to seek medical care at your doctor's office, retail clinic, urgent care center, or emergency room.  If you have a medical emergency, please immediately call 911 or go to the emergency department.  Pager Numbers  - Dr. Kowalski: [phone]  - Dr. Moye: [phone]  - Dr. Stewart: [phone]  In the event of inclement weather, please call our main line at [phone] for an update on the status of any delays or closures.  Dermatology Medication Tips: Please  keep the boxes that topical medications come in in order to help keep track of the instructions about where and how to use these. Pharmacies typically print the medication instructions only on the boxes and not directly on the medication tubes.   If your medication is too expensive, please contact our office at [phone] option 4 or send us a message through MyChart.   We are unable to tell what your co-pay for medications will be in advance as this is different depending on your insurance coverage. However, we may be able to find a substitute medication at lower cost or fill out paperwork to get insurance to cover a needed medication.   If a prior authorization is required to get your medication covered by your insurance company, please allow us 1-2 business days to complete this process.  Drug prices often vary depending on where the prescription is filled and some pharmacies may offer cheaper prices.  The website www.goodrx.com contains coupons for medications through different pharmacies. The prices here do not account for what the cost may be with help from insurance (it may be cheaper with your insurance), but the website can give you the price if you did not use any insurance.  - You can print the associated coupon and take it with your prescription to the pharmacy.  - You may also stop by our office during regular business hours and pick up a GoodRx coupon card.  - If you need your prescription sent electronically to a different pharmacy, notify our office through [facility] MyChart or by phone at [phone] option 4.     Si Usted Necesita Algo Despus de Su Visita  Tambin puede enviarnos un mensaje a travs de MyChart. Por lo general respondemos a los mensajes de MyChart en el transcurso de 1 a 2 das hbiles.  Para renovar recetas, por favor pida a su farmacia que se ponga en contacto con nuestra oficina. Nuestro nmero de fax es el [phone].  Si tiene un asunto urgente  cuando la clnica est cerrada y que no puede esperar hasta el siguiente da hbil, puede llamar/localizar a su doctor(a) al nmero que aparece a continuacin.   Por favor, tenga en cuenta que aunque hacemos todo lo posible para estar disponibles para asuntos urgentes fuera del horario de oficina, no estamos disponibles las 24 horas del da, los 7 das de la semana.   Si tiene un problema urgente y no puede comunicarse con nosotros, puede optar por buscar atencin mdica  en el consultorio de su doctor(a), en una clnica privada, en un centro de atencin urgente o en una sala de emergencias.  Si tiene una emergencia mdica, por favor llame inmediatamente al 911 o vaya a la sala de emergencias.  Nmeros de bper  - Dr. Kowalski: [phone]  - Dra. Moye: [phone]  - Dra. Stewart: [phone]  En caso   de inclemencias del tiempo, por favor llame a nuestra lnea principal al [phone] para una actualizacin sobre el estado de cualquier retraso o cierre.  Consejos para la medicacin en dermatologa: Por favor, guarde las cajas en las que vienen los medicamentos de uso tpico para ayudarle a seguir las instrucciones sobre dnde y cmo usarlos. Las farmacias generalmente imprimen las instrucciones del medicamento slo en las cajas y no directamente en los tubos del medicamento.   Si su medicamento es muy caro, por favor, pngase en contacto con nuestra oficina llamando al [phone] y presione la opcin 4 o envenos un mensaje a travs de MyChart.   No podemos decirle cul ser su copago por los medicamentos por adelantado ya que esto es diferente dependiendo de la cobertura de su seguro. Sin embargo, es posible que podamos encontrar un medicamento sustituto a menor costo o llenar un formulario para que el seguro cubra el medicamento que se considera necesario.   Si se requiere una autorizacin previa para que su compaa de seguros cubra su medicamento, por favor permtanos de 1 a 2  das hbiles para completar este proceso.  Los precios de los medicamentos varan con frecuencia dependiendo del lugar de dnde se surte la receta y alguna farmacias pueden ofrecer precios ms baratos.  El sitio web www.goodrx.com tiene cupones para medicamentos de diferentes farmacias. Los precios aqu no tienen en cuenta lo que podra costar con la ayuda del seguro (puede ser ms barato con su seguro), pero el sitio web puede darle el precio si no utiliz ningn seguro.  - Puede imprimir el cupn correspondiente y llevarlo con su receta a la farmacia.  - Tambin puede pasar por nuestra oficina durante el horario de atencin regular y recoger una tarjeta de cupones de GoodRx.  - Si necesita que su receta se enve electrnicamente a una farmacia diferente, informe a nuestra oficina a travs de MyChart de [place] o por telfono llamando al [phone] y presione la opcin 4.  

## 2022-06-01 NOTE — Progress Notes (Unsigned)
Follow-Up Visit   Subjective  Terry Franco is a 61 y.o. male who presents for the following: Annual Exam (Tbse , hx dysplastic, hx atopic derm, noticed a darker area at nose he would like checked. ). The patient presents for Total-Body Skin Exam (TBSE) for skin cancer screening and mole check.  The patient has spots, moles and lesions to be evaluated, some may be new or changing and the patient has concerns that these could be cancer.  The following portions of the chart were reviewed this encounter and updated as appropriate:  Tobacco  Allergies  Meds  Problems  Med Hx  Surg Hx  Fam Hx     Review of Systems: No other skin or systemic complaints except as noted in HPI or Assessment and Plan.  Objective  Well appearing patient in no apparent distress; mood and affect are within normal limits.  A full examination was performed including scalp, head, eyes, ears, nose, lips, neck, chest, axillae, abdomen, back, buttocks, bilateral upper extremities, bilateral lower extremities, hands, feet, fingers, toes, fingernails, and toenails. All findings within normal limits unless otherwise noted below.   Assessment & Plan  Atopic dermatitis, unspecified type trunk and extremeties Atopic dermatitis (eczema) is a chronic, relapsing, pruritic condition that can significantly affect quality of life. It is often associated with allergic rhinitis and/or asthma and can require treatment with topical medications, phototherapy, or in severe cases biologic injectable medication (Dupixent; Adbry) or Oral JAK inhibitors.   If clear don't have to use only use when needed.  Derma-Smooth oil QD PRN after bathing and while skin is still damp.  Chronic and persistent condition with duration or expected duration over one year. Condition is symptomatic / bothersome to patient. Not to goal.  Related Medications Fluocinolone Acetonide Body 0.01 % OIL Apply to damp skin after showering QD PRN.  Skin cancer  screening  Lentigines - Scattered tan macules - Due to sun exposure - Benign-appearing, observe - Recommend daily broad spectrum sunscreen SPF 30+ to sun-exposed areas, reapply every 2 hours as needed. - Call for any changes  Seborrheic Keratoses - Stuck-on, waxy, tan-brown papules and/or plaques at nose - Benign-appearing - Discussed benign etiology and prognosis. - Observe - Call for any changes  Melanocytic Nevi - Tan-brown and/or pink-flesh-colored symmetric macules and papules - Benign appearing on exam today - Observation - Call clinic for new or changing moles - Recommend daily use of broad spectrum spf 30+ sunscreen to sun-exposed areas.   Hemangiomas - Red papules - Discussed benign nature - Observe - Call for any changes  Dermatofibroma at left lower leg  - Firm pink/brown papulenodule with dimple sign - Benign appearing - Call for any changes  Actinic Damage - Chronic condition, secondary to cumulative UV/sun exposure - diffuse scaly erythematous macules with underlying dyspigmentation - Recommend daily broad spectrum sunscreen SPF 30+ to sun-exposed areas, reapply every 2 hours as needed.  - Staying in the shade or wearing long sleeves, sun glasses (UVA+UVB protection) and wide brim hats (4-inch brim around the entire circumference of the hat) are also recommended for sun protection.  - Call for new or changing lesions.  History of dysplastic nevus L prox tricep of the lat axillary fold 2022 and right epigastric excision 2021 Bx proven moderate to severe - Clear. Observe for recurrence. Call clinic for new or changing lesions.  Recommend regular skin exams, daily broad-spectrum spf 30+ sunscreen use, and photoprotection.    Skin cancer screening performed today. Return in  about 1 year (around 06/02/2023) for TBSE. IRuthell Rummage, CMA, am acting as scribe for Sarina Ser, MD. Documentation: I have reviewed the above documentation for accuracy and  completeness, and I agree with the above.  Sarina Ser, MD

## 2022-06-02 DIAGNOSIS — M461 Sacroiliitis, not elsewhere classified: Secondary | ICD-10-CM | POA: Diagnosis not present

## 2022-06-06 ENCOUNTER — Encounter: Payer: Self-pay | Admitting: Dermatology

## 2022-06-06 DIAGNOSIS — M6281 Muscle weakness (generalized): Secondary | ICD-10-CM | POA: Diagnosis not present

## 2022-06-08 DIAGNOSIS — M6281 Muscle weakness (generalized): Secondary | ICD-10-CM | POA: Diagnosis not present

## 2022-06-13 DIAGNOSIS — M6281 Muscle weakness (generalized): Secondary | ICD-10-CM | POA: Diagnosis not present

## 2022-06-15 DIAGNOSIS — M6281 Muscle weakness (generalized): Secondary | ICD-10-CM | POA: Diagnosis not present

## 2022-06-24 DIAGNOSIS — M6281 Muscle weakness (generalized): Secondary | ICD-10-CM | POA: Diagnosis not present

## 2022-06-26 ENCOUNTER — Other Ambulatory Visit: Payer: Self-pay | Admitting: Family Medicine

## 2022-06-26 DIAGNOSIS — K120 Recurrent oral aphthae: Secondary | ICD-10-CM

## 2022-06-27 DIAGNOSIS — M461 Sacroiliitis, not elsewhere classified: Secondary | ICD-10-CM | POA: Diagnosis not present

## 2022-06-27 DIAGNOSIS — M5136 Other intervertebral disc degeneration, lumbar region: Secondary | ICD-10-CM | POA: Diagnosis not present

## 2022-07-04 DIAGNOSIS — M6281 Muscle weakness (generalized): Secondary | ICD-10-CM | POA: Diagnosis not present

## 2022-07-06 DIAGNOSIS — M6281 Muscle weakness (generalized): Secondary | ICD-10-CM | POA: Diagnosis not present

## 2022-07-11 ENCOUNTER — Telehealth: Payer: Self-pay

## 2022-07-11 DIAGNOSIS — M6281 Muscle weakness (generalized): Secondary | ICD-10-CM | POA: Diagnosis not present

## 2022-07-11 NOTE — Telephone Encounter (Signed)
Patient has colonoscopy scheduled for 10/10/22  with Dr. Allen Norris.  This is  a repeat colonoscopy history of colon polyps.  Last colonoscopy performed by Dr. Bonna Gains 10/13/22.  Dr. Bonna Gains recommended in her results letter 10/18/21,  "pt to have a repeat colonoscopy in 1 year, with a 2 day prep".  He lvm stating that he does not want to do with a 2 day prep.  Please advise as to if he can do 1 day prep with Golytely.  Thanks,  [place], [place]

## 2022-07-13 DIAGNOSIS — M6281 Muscle weakness (generalized): Secondary | ICD-10-CM | POA: Diagnosis not present

## 2022-07-14 ENCOUNTER — Other Ambulatory Visit: Payer: Self-pay

## 2022-07-14 ENCOUNTER — Telehealth: Payer: Self-pay

## 2022-07-14 DIAGNOSIS — Z86010 Personal history of colonic polyps: Secondary | ICD-10-CM

## 2022-07-14 MED ORDER — GOLYTELY 236 G PO SOLR: ORAL | 0 refills | Status: DC

## 2022-07-14 NOTE — Telephone Encounter (Signed)
Voice message has been left for patient to call back to advise regarding bowel prep.  Dr. Allen Norris has advised that "Yes he can but if not cleaned out and we can't see then we will stop the procedure and recommend another prep and repeat".   This message has been sent to him in [place] in case he is not able to return call back.  Instructions will be updated and mailed to reflect bowel prep advise.  Thanks, [place], [place]

## 2022-07-18 DIAGNOSIS — M6281 Muscle weakness (generalized): Secondary | ICD-10-CM | POA: Diagnosis not present

## 2022-07-20 DIAGNOSIS — M6281 Muscle weakness (generalized): Secondary | ICD-10-CM | POA: Diagnosis not present

## 2022-07-25 DIAGNOSIS — M6281 Muscle weakness (generalized): Secondary | ICD-10-CM | POA: Diagnosis not present

## 2022-07-27 DIAGNOSIS — M6281 Muscle weakness (generalized): Secondary | ICD-10-CM | POA: Diagnosis not present

## 2022-08-01 DIAGNOSIS — M6281 Muscle weakness (generalized): Secondary | ICD-10-CM | POA: Diagnosis not present

## 2022-08-10 DIAGNOSIS — M6281 Muscle weakness (generalized): Secondary | ICD-10-CM | POA: Diagnosis not present

## 2022-08-15 DIAGNOSIS — M6281 Muscle weakness (generalized): Secondary | ICD-10-CM | POA: Diagnosis not present

## 2022-08-22 DIAGNOSIS — M6281 Muscle weakness (generalized): Secondary | ICD-10-CM | POA: Diagnosis not present

## 2022-09-01 ENCOUNTER — Encounter: Payer: Self-pay | Admitting: Physician Assistant

## 2022-09-01 ENCOUNTER — Telehealth (INDEPENDENT_AMBULATORY_CARE_PROVIDER_SITE_OTHER): Payer: Federal, State, Local not specified - PPO | Admitting: Physician Assistant

## 2022-09-01 DIAGNOSIS — R110 Nausea: Secondary | ICD-10-CM

## 2022-09-01 DIAGNOSIS — U071 COVID-19: Secondary | ICD-10-CM | POA: Diagnosis not present

## 2022-09-01 MED ORDER — NIRMATRELVIR/RITONAVIR (PAXLOVID)TABLET: 3.0000 | ORAL_TABLET | Freq: Two times a day (BID) | ORAL | 0 refills | Status: AC

## 2022-09-01 MED ORDER — ONDANSETRON HCL 4 MG PO TABS: 4.0000 mg | ORAL_TABLET | Freq: Three times a day (TID) | ORAL | 0 refills | Status: DC | PRN

## 2022-09-01 NOTE — Progress Notes (Unsigned)
MyChart Video Visit    Virtual Visit via Video Note   This visit type was conducted due to national recommendations for restrictions regarding the COVID-19 Pandemic (e.g. social distancing) in an effort to limit this patient's exposure and mitigate transmission in our community. This patient is at least at moderate risk for complications without adequate follow up. This format is felt to be most appropriate for this patient at this time. Physical exam was limited by quality of the video and audio technology used for the visit.   Patient location: home Provider location: [facility]  I discussed the limitations of evaluation and management by telemedicine and the availability of in person appointments. The patient expressed understanding and agreed to proceed.  Patient: Terry Franco   DOB: 01-03-1961   61 y.o. Male  MRN: [id number] Visit Date: 09/01/2022  Today's healthcare provider: Mardene Speak, PA-C   Chief Complaint  Patient presents with   URI   Subjective    URI  This is a new problem. Maximum temperature: 101.5 at 9am this morning. Associated symptoms include congestion (sinus congestion), coughing, headaches, nausea, rhinorrhea and a sore throat. Pertinent negatives include no abdominal pain, chest pain, ear pain, vomiting or wheezing. He has tried acetaminophen for the symptoms.    Patient repots that his spouse tested positive for COVID on 08/28/2022. He took a home COVID test this morning and the result was positive.    Medications: Outpatient Medications Prior to Visit  Medication Sig   amLODipine (NORVASC) 5 MG tablet Take 5 mg by mouth daily.   budesonide-formoterol (SYMBICORT) 80-4.5 MCG/ACT inhaler Inhale 2 puffs into the lungs 2 (two) times daily.   co-enzyme Q-10 30 MG capsule Take 1 capsule (30 mg total) by mouth 3 (three) times daily.   COVID-19 mRNA bivalent vaccine, Pfizer, (PFIZER COVID-19 VAC BIVALENT) injection Inject into the muscle.   Fluocinolone  Acetonide Body 0.01 % OIL Apply to damp skin after showering QD PRN.   loratadine (CLARITIN) 10 MG tablet Take 10 mg by mouth daily.   losartan-hydrochlorothiazide (HYZAAR) 100-25 MG per tablet Take 1 tablet by mouth daily.    meloxicam (MOBIC) 15 MG tablet Take 15 mg by mouth daily.   methocarbamol (ROBAXIN) 750 MG tablet Take 750 mg by mouth 3 (three) times daily.   metoprolol succinate (TOPROL-XL) 25 MG 24 hr tablet TAKE 1 TABLET(25 MG) BY MOUTH DAILY   pantoprazole (PROTONIX) 40 MG tablet Take 1 tablet (40 mg total) by mouth daily.   polyethylene glycol (GOLYTELY) 236 g solution Fill Golytely container to the fill line with luke warm water.  Mix well.  Drink 8 oz every 15-20 minutes until entire contents have been completed.  Do Not Eat or Drink Anything 4 hours Prior to Colonoscopy.   rosuvastatin (CRESTOR) 10 MG tablet Take 1 tablet (10 mg total) by mouth daily.   triamcinolone (NASACORT) 55 MCG/ACT AERO nasal inhaler Place 2 sprays into the nose daily.   valACYclovir (VALTREX) 1000 MG tablet TAKE ONE TABLET BY MOUTH ONE TIME DAILY   albuterol (VENTOLIN HFA) 108 (90 Base) MCG/ACT inhaler Inhale 2 puffs into the lungs every 6 (six) hours as needed for wheezing or shortness of breath. (Patient not taking: Reported on 01/03/2022)   sucralfate (CARAFATE) 1 GM/10ML suspension Take 10 mLs (1 g total) by mouth 4 (four) times daily -  with meals and at bedtime. (Patient not taking: Reported on 01/03/2022)   No facility-administered medications prior to visit.    Review of  Systems  Constitutional:  Positive for chills, diaphoresis and fatigue. Negative for appetite change and fever.  HENT:  Positive for congestion (sinus congestion), postnasal drip, rhinorrhea and sore throat. Negative for ear pain and nosebleeds.   Respiratory:  Positive for cough. Negative for chest tightness, shortness of breath and wheezing.   Cardiovascular:  Negative for chest pain and palpitations.  Gastrointestinal:   Positive for nausea. Negative for abdominal pain and vomiting.  Musculoskeletal:  Positive for myalgias.  Neurological:  Positive for headaches.       Objective    There were no vitals taken for this visit.     Physical Exam Constitutional:      General: He is not in acute distress.    Appearance: Normal appearance. He is not diaphoretic.  HENT:     Head: Normocephalic.  Eyes:     Conjunctiva/sclera: Conjunctivae normal.  Pulmonary:     Effort: Pulmonary effort is normal. No respiratory distress.  Neurological:     Mental Status: He is alert and oriented to person, place, and time. Mental status is at baseline.        Assessment & Plan     1. COVID-19 virus infection  - nirmatrelvir/ritonavir EUA (PAXLOVID) 20 x 150 MG & 10 x '100MG'$  TABS; Take 3 tablets by mouth 2 (two) times daily for 5 days. (Take nirmatrelvir 150 mg two tablets twice daily for 5 days and ritonavir 100 mg one tablet twice daily for 5 days) Patient GFR is 89/in 2022  Dispense: 30 tablet; Refill: 0 - ondansetron (ZOFRAN) 4 MG tablet; Take 1 tablet (4 mg total) by mouth every 8 (eight) hours as needed for nausea or vomiting.  Dispense: 20 tablet; Refill: 0  2. Nausea  - ondansetron (ZOFRAN) 4 MG tablet; Take 1 tablet (4 mg total) by mouth every 8 (eight) hours as needed for nausea or vomiting.  Dispense: 20 tablet; Refill: 0  FU PRN    I discussed the assessment and treatment plan with the patient. The patient was provided an opportunity to ask questions and all were answered. The patient agreed with the plan and demonstrated an understanding of the instructions.   The patient was advised to call back or seek an in-person evaluation if the symptoms worsen or if the condition fails to improve as anticipated.  I provided 7 minutes of non-face-to-face time during this encounter.  The entirety of the information documented in the History of Present Illness, Review of Systems and Physical Exam were  personally obtained by me. Portions of this information were initially documented by the CMA and reviewed by me for thoroughness and accuracy.  Portions of this note were created using dictation software and may contain typographical errors.    Mardene Speak, PA-C [facility] [phone] (phone) [phone] (fax)  [place]

## 2022-09-12 DIAGNOSIS — M6281 Muscle weakness (generalized): Secondary | ICD-10-CM | POA: Diagnosis not present

## 2022-09-13 ENCOUNTER — Telehealth: Payer: Self-pay

## 2022-09-13 NOTE — Telephone Encounter (Signed)
Patient has been informed that he does not have to do a repeat COVID test prior to his 10/10/22 Colonoscopy.  He tested positive on 09/01/22  his procedure is greater than a month out from his positive testing.  Thanks,  [place], [place]

## 2022-09-21 DIAGNOSIS — E7800 Pure hypercholesterolemia, unspecified: Secondary | ICD-10-CM | POA: Diagnosis not present

## 2022-09-21 DIAGNOSIS — R002 Palpitations: Secondary | ICD-10-CM | POA: Diagnosis not present

## 2022-09-21 DIAGNOSIS — I10 Essential (primary) hypertension: Secondary | ICD-10-CM | POA: Diagnosis not present

## 2022-09-21 DIAGNOSIS — I700 Atherosclerosis of aorta: Secondary | ICD-10-CM | POA: Diagnosis not present

## 2022-09-22 ENCOUNTER — Other Ambulatory Visit: Payer: Self-pay | Admitting: Student

## 2022-09-22 DIAGNOSIS — E7800 Pure hypercholesterolemia, unspecified: Secondary | ICD-10-CM

## 2022-09-27 ENCOUNTER — Ambulatory Visit
Admission: RE | Admit: 2022-09-27 | Discharge: 2022-09-27 | Disposition: A | Discharge status: Home / Self Care | Payer: Federal, State, Local not specified - PPO | Source: Ambulatory Visit | Attending: Student | Admitting: Student

## 2022-09-27 DIAGNOSIS — E7800 Pure hypercholesterolemia, unspecified: Secondary | ICD-10-CM | POA: Insufficient documentation

## 2022-09-28 DIAGNOSIS — M6281 Muscle weakness (generalized): Secondary | ICD-10-CM | POA: Diagnosis not present

## 2022-10-03 DIAGNOSIS — M76891 Other specified enthesopathies of right lower limb, excluding foot: Secondary | ICD-10-CM | POA: Diagnosis not present

## 2022-10-03 DIAGNOSIS — M25851 Other specified joint disorders, right hip: Secondary | ICD-10-CM | POA: Diagnosis not present

## 2022-10-04 ENCOUNTER — Ambulatory Visit: Payer: Self-pay | Admitting: *Deleted

## 2022-10-04 DIAGNOSIS — M6281 Muscle weakness (generalized): Secondary | ICD-10-CM | POA: Diagnosis not present

## 2022-10-04 NOTE — Telephone Encounter (Signed)
  Chief Complaint: post COVID cough Symptoms: cough- productive Frequency: started with COVID diagnosis 9/14 Pertinent Negatives: Patient denies SOB Disposition: '[]'$ ED /'[]'$ Urgent Care (no appt availability in office) / '[x]'$ Appointment(In office/virtual)/ '[]'$  [facility]/ '[]'$ Home Care/ '[]'$ Refused Recommended Disposition /'[]'$ [facility]/ '[]'$  Follow-up with PCP Additional Notes:

## 2022-10-04 NOTE — Telephone Encounter (Signed)
Summary: bad cough   Pt stated has had a bad cough since having COVID on 09/01/2022. Pt denied SOB and difficulty breathing.    Pt seeking clinical advice.      Reason for Disposition  [1] PERSISTING SYMPTOMS OF COVID-19 AND [2] NO medical visit for COVID-19 in past 2 weeks  Answer Assessment - Initial Assessment Questions 1. COVID-19 ONSET: "When did the symptoms of COVID-19 first start?"     9/14 2. DIAGNOSIS CONFIRMATION: "How were you diagnosed?" (e.g., COVID-19 oral or nasal viral test; COVID-19 antibody test; doctor visit)     Home test 9/15 3. MAIN SYMPTOM:  "What is your main concern or symptom right now?" (e.g., breathing difficulty, cough, fatigue. loss of smell)     cough 4. SYMPTOM ONSET: "When did the  cough  start?"     9/14 5. BETTER-SAME-WORSE: "Are you getting better, staying the same, or getting worse over the last 1 to 2 weeks?"     Better- not going away- at night is worse 6. RECENT MEDICAL VISIT: "Have you been seen by a healthcare provider (doctor, NP, PA) for these persisting COVID-19 symptoms?" If Yes, ask: "When were you seen?" (e.g., date)     Video visit-9/15 7. COUGH: "Do you have a cough?" If Yes, ask: "How bad is the cough?"       Yes- night time is worse 8. FEVER: "Do you have a fever?" If Yes, ask: "What is your temperature, how was it measured, and when did it start?"     no 9. BREATHING DIFFICULTY: "Are you having any trouble breathing?" If Yes, ask: "How bad is your breathing?" (e.g., mild, moderate, severe)    - MILD: No SOB at rest, mild SOB with walking, speaks normally in sentences, can lie down, no retractions, pulse < 100.    - MODERATE: SOB at rest, SOB with minimal exertion and prefers to sit, cannot lie down flat, speaks in phrases, mild retractions, audible wheezing, pulse 100-120.    - SEVERE: Very SOB at rest, speaks in single words, struggling to breathe, sitting hunched forward, retractions, pulse > 120.       normal 10. OTHER SYMPTOMS:  "Do you have any other symptoms?"  (e.g., fatigue, headache, muscle pain, weakness)       fatigue 11. HIGH RISK DISEASE: "Do you have any chronic medical problems?" (e.g., asthma, heart or lung disease, weak immune system, obesity, etc.)       Hx pneumonia  12. VACCINE: "Have you gotten the COVID-19 vaccine?" If Yes, ask: "Which one, how many shots, when did you get it?"       Not current 13. PREGNANCY: "Is there any chance you are pregnant?" "When was your last menstrual period?"         14. O2 SATURATION MONITOR:  "Do you use an oxygen saturation monitor (pulse oximeter) at home?" If Yes, ask "What is your reading (oxygen level) today?" "What is your usual oxygen saturation reading?" (e.g., 95%)  Protocols used: Coronavirus (COVID-19) Persisting Symptoms Follow-up Call-[facility]

## 2022-10-04 NOTE — Progress Notes (Unsigned)
Established patient visit   Patient: Terry Franco   DOB: 1961-07-28   61 y.o. Male  MRN: [id number] Visit Date: 10/05/2022  Today's healthcare provider: Gwyneth Sprout, FNP  Introduced to nurse practitioner role and practice setting.  All questions answered.  Discussed provider/patient relationship and expectations.  I,Avigdor Dollar J Tyshana Nishida,acting as a scribe for Gwyneth Sprout, FNP.,have documented all relevant documentation on the behalf of Gwyneth Sprout, FNP,as directed by  Gwyneth Sprout, FNP while in the presence of Gwyneth Sprout, FNP.   Chief Complaint  Patient presents with   Cough    Patient complains of continued productive cough from having covid a month ago, with upset stomach.    Subjective    HPI HPI     Cough    Additional comments: Patient complains of continued productive cough from having covid a month ago, with upset stomach.       Last edited by Smitty Knudsen, CMA on 10/05/2022  8:55 AM.      Medications: Outpatient Medications Prior to Visit  Medication Sig   amLODipine (NORVASC) 5 MG tablet Take 5 mg by mouth daily.   co-enzyme Q-10 30 MG capsule Take 1 capsule (30 mg total) by mouth 3 (three) times daily.   COVID-19 mRNA bivalent vaccine, Pfizer, (PFIZER COVID-19 VAC BIVALENT) injection Inject into the muscle.   Fluocinolone Acetonide Body 0.01 % OIL Apply to damp skin after showering QD PRN.   loratadine (CLARITIN) 10 MG tablet Take 10 mg by mouth daily.   losartan-hydrochlorothiazide (HYZAAR) 100-25 MG per tablet Take 1 tablet by mouth daily.    meloxicam (MOBIC) 15 MG tablet Take 15 mg by mouth daily.   methocarbamol (ROBAXIN) 750 MG tablet Take 750 mg by mouth 3 (three) times daily.   metoprolol succinate (TOPROL-XL) 25 MG 24 hr tablet TAKE 1 TABLET(25 MG) BY MOUTH DAILY   ondansetron (ZOFRAN) 4 MG tablet Take 1 tablet (4 mg total) by mouth every 8 (eight) hours as needed for nausea or vomiting.   pantoprazole (PROTONIX) 40 MG tablet Take 1  tablet (40 mg total) by mouth daily.   polyethylene glycol (GOLYTELY) 236 g solution Fill Golytely container to the fill line with luke warm water.  Mix well.  Drink 8 oz every 15-20 minutes until entire contents have been completed.  Do Not Eat or Drink Anything 4 hours Prior to Colonoscopy.   rosuvastatin (CRESTOR) 10 MG tablet Take 1 tablet (10 mg total) by mouth daily.   traMADol (ULTRAM) 50 MG tablet Take 1 tablet by mouth 2 (two) times daily as needed.   triamcinolone (NASACORT) 55 MCG/ACT AERO nasal inhaler Place 2 sprays into the nose daily.   valACYclovir (VALTREX) 1000 MG tablet TAKE ONE TABLET BY MOUTH ONE TIME DAILY   [DISCONTINUED] budesonide-formoterol (SYMBICORT) 80-4.5 MCG/ACT inhaler Inhale 2 puffs into the lungs 2 (two) times daily.   sucralfate (CARAFATE) 1 GM/10ML suspension Take 10 mLs (1 g total) by mouth 4 (four) times daily -  with meals and at bedtime. (Patient not taking: Reported on 01/03/2022)   [DISCONTINUED] albuterol (VENTOLIN HFA) 108 (90 Base) MCG/ACT inhaler Inhale 2 puffs into the lungs every 6 (six) hours as needed for wheezing or shortness of breath. (Patient not taking: Reported on 01/03/2022)   No facility-administered medications prior to visit.    Review of Systems    Objective    BP (!) 140/86 (BP Location: Left Arm, Patient Position: Sitting, Cuff Size: Normal)  Pulse 62   Temp 97.9 F (36.6 C) (Oral)   Resp 17   Ht '5\' 9"'$  (1.753 m)   Wt 174 lb (78.9 kg)   SpO2 98%   BMI 25.70 kg/m   Physical Exam Vitals and nursing note reviewed.  Constitutional:      Appearance: Normal appearance. He is overweight.  HENT:     Head: Normocephalic and atraumatic.  Eyes:     Pupils: Pupils are equal, round, and reactive to light.  Cardiovascular:     Rate and Rhythm: Normal rate and regular rhythm.     Pulses: Normal pulses.     Heart sounds: Normal heart sounds.  Pulmonary:     Effort: Pulmonary effort is normal.     Breath sounds: Decreased air  movement present. Examination of the right-upper field reveals wheezing. Examination of the left-upper field reveals wheezing. Wheezing present.  Musculoskeletal:        General: Normal range of motion.     Cervical back: Normal range of motion.  Skin:    General: Skin is warm and dry.     Capillary Refill: Capillary refill takes less than 2 seconds.  Neurological:     General: No focal deficit present.     Mental Status: He is alert and oriented to person, place, and time. Mental status is at baseline.      No results found for any visits on 10/05/22.  Assessment & Plan     Problem List Items Addressed This Visit       Respiratory   Moderate persistent asthma with exacerbation - Primary    Acute, stable Slight elevation in BP noted with OTC cough/cold medication Recommend continued use of daily inhaler, adding refill of SABA and additional of 5 day burst of steroids. Recommend montelukast qHS to assist with mucus production s/s recent COVID infection       Relevant Medications   budesonide-formoterol (SYMBICORT) 80-4.5 MCG/ACT inhaler   predniSONE (DELTASONE) 50 MG tablet   albuterol (VENTOLIN HFA) 108 (90 Base) MCG/ACT inhaler   montelukast (SINGULAIR) 10 MG tablet   Return if symptoms worsen or fail to improve.     Vonna Kotyk, FNP, have reviewed all documentation for this visit. The documentation on 10/05/22 for the exam, diagnosis, procedures, and orders are all accurate and complete.  Gwyneth Sprout, [place] [phone] (phone) [phone] (fax)  [place]

## 2022-10-05 ENCOUNTER — Ambulatory Visit (INDEPENDENT_AMBULATORY_CARE_PROVIDER_SITE_OTHER): Payer: Federal, State, Local not specified - PPO | Admitting: Family Medicine

## 2022-10-05 ENCOUNTER — Encounter: Payer: Self-pay | Admitting: Family Medicine

## 2022-10-05 VITALS — BP 140/86 | HR 62 | Temp 97.9°F | Resp 17 | Ht 69.0 in | Wt 174.0 lb

## 2022-10-05 DIAGNOSIS — J4541 Moderate persistent asthma with (acute) exacerbation: Secondary | ICD-10-CM | POA: Diagnosis not present

## 2022-10-05 MED ORDER — ALBUTEROL SULFATE HFA 108 (90 BASE) MCG/ACT IN AERS: 2.0000 | INHALATION_SPRAY | Freq: Four times a day (QID) | RESPIRATORY_TRACT | 0 refills | Status: DC | PRN

## 2022-10-05 MED ORDER — PREDNISONE 50 MG PO TABS: ORAL_TABLET | ORAL | 0 refills | Status: DC

## 2022-10-05 MED ORDER — BUDESONIDE-FORMOTEROL FUMARATE 80-4.5 MCG/ACT IN AERO: 2.0000 | INHALATION_SPRAY | Freq: Two times a day (BID) | RESPIRATORY_TRACT | 3 refills | Status: DC

## 2022-10-05 MED ORDER — MONTELUKAST SODIUM 10 MG PO TABS: 10.0000 mg | ORAL_TABLET | Freq: Every day | ORAL | 3 refills | Status: DC

## 2022-10-05 NOTE — Assessment & Plan Note (Signed)
Acute, stable Slight elevation in BP noted with OTC cough/cold medication Recommend continued use of daily inhaler, adding refill of SABA and additional of 5 day burst of steroids. Recommend montelukast qHS to assist with mucus production s/s recent COVID infection

## 2022-10-09 ENCOUNTER — Ambulatory Visit: Payer: Federal, State, Local not specified - PPO | Admitting: Family Medicine

## 2022-10-10 ENCOUNTER — Other Ambulatory Visit: Payer: Self-pay

## 2022-10-10 ENCOUNTER — Ambulatory Visit: Payer: Federal, State, Local not specified - PPO | Admitting: Anesthesiology

## 2022-10-10 ENCOUNTER — Encounter: Payer: Self-pay | Admitting: Gastroenterology

## 2022-10-10 ENCOUNTER — Ambulatory Visit
Admission: RE | Admit: 2022-10-10 | Discharge: 2022-10-10 | Disposition: A | Discharge status: Home / Self Care | Payer: Federal, State, Local not specified - PPO | Attending: Gastroenterology | Admitting: Gastroenterology

## 2022-10-10 ENCOUNTER — Encounter
Admission: RE | Disposition: A | Discharge status: Home / Self Care | Payer: Self-pay | Source: Home / Self Care | Attending: Gastroenterology

## 2022-10-10 DIAGNOSIS — Z79899 Other long term (current) drug therapy: Secondary | ICD-10-CM | POA: Diagnosis not present

## 2022-10-10 DIAGNOSIS — Z860100 Personal history of colon polyps, unspecified: Secondary | ICD-10-CM

## 2022-10-10 DIAGNOSIS — Z8616 Personal history of COVID-19: Secondary | ICD-10-CM | POA: Diagnosis not present

## 2022-10-10 DIAGNOSIS — B009 Herpesviral infection, unspecified: Secondary | ICD-10-CM | POA: Diagnosis not present

## 2022-10-10 DIAGNOSIS — I10 Essential (primary) hypertension: Secondary | ICD-10-CM | POA: Diagnosis not present

## 2022-10-10 DIAGNOSIS — Z09 Encounter for follow-up examination after completed treatment for conditions other than malignant neoplasm: Secondary | ICD-10-CM | POA: Diagnosis not present

## 2022-10-10 DIAGNOSIS — K640 First degree hemorrhoids: Secondary | ICD-10-CM | POA: Insufficient documentation

## 2022-10-10 DIAGNOSIS — F418 Other specified anxiety disorders: Secondary | ICD-10-CM | POA: Diagnosis not present

## 2022-10-10 DIAGNOSIS — E785 Hyperlipidemia, unspecified: Secondary | ICD-10-CM | POA: Diagnosis not present

## 2022-10-10 DIAGNOSIS — M1990 Unspecified osteoarthritis, unspecified site: Secondary | ICD-10-CM | POA: Diagnosis not present

## 2022-10-10 DIAGNOSIS — K219 Gastro-esophageal reflux disease without esophagitis: Secondary | ICD-10-CM | POA: Diagnosis not present

## 2022-10-10 DIAGNOSIS — T7840XA Allergy, unspecified, initial encounter: Secondary | ICD-10-CM | POA: Diagnosis not present

## 2022-10-10 HISTORY — PX: COLONOSCOPY WITH PROPOFOL: SHX5780

## 2022-10-10 SURGERY — COLONOSCOPY WITH PROPOFOL
Anesthesia: General

## 2022-10-10 MED ORDER — SODIUM CHLORIDE 0.9 % IV SOLN: INTRAVENOUS | Status: DC

## 2022-10-10 MED ORDER — PROPOFOL 500 MG/50ML IV EMUL
INTRAVENOUS | Status: DC | PRN
  Administered 2022-10-10: 165 ug/kg/min via INTRAVENOUS

## 2022-10-10 MED ORDER — PROPOFOL 1000 MG/100ML IV EMUL
INTRAVENOUS | Status: AC
  Filled 2022-10-10: qty 400

## 2022-10-10 MED ORDER — LIDOCAINE HCL (CARDIAC) PF 100 MG/5ML IV SOSY
PREFILLED_SYRINGE | INTRAVENOUS | Status: DC | PRN
  Administered 2022-10-10: 100 mg via INTRAVENOUS

## 2022-10-10 MED ORDER — PROPOFOL 10 MG/ML IV BOLUS
INTRAVENOUS | Status: DC | PRN
  Administered 2022-10-10: 70 mg via INTRAVENOUS
  Administered 2022-10-10: 30 mg via INTRAVENOUS
  Administered 2022-10-10: 10 mg via INTRAVENOUS
  Administered 2022-10-10: 20 mg via INTRAVENOUS

## 2022-10-10 NOTE — Anesthesia Preprocedure Evaluation (Addendum)
Anesthesia Evaluation  Patient identified by MRN, date of birth, ID band Patient awake    Reviewed: Allergy & Precautions, NPO status , Patient's Chart, lab work & pertinent test results  History of Anesthesia Complications (+) PONV and history of anesthetic complications  Airway Mallampati: II  TM Distance: >3 FB Neck ROM: full    Dental  (+) Teeth Intact   Pulmonary neg shortness of breath,  Residual cough s/p covid 5 weeks ago   Pulmonary exam normal breath sounds clear to auscultation       Cardiovascular Exercise Tolerance: Good hypertension, Pt. on medications Normal cardiovascular exam Rhythm:Regular Rate:Normal     Neuro/Psych Anxiety Depression negative neurological ROS  negative psych ROS   GI/Hepatic negative GI ROS, Neg liver ROS,   Endo/Other  negative endocrine ROS  Renal/GU negative Renal ROS  negative genitourinary   Musculoskeletal  (+) Arthritis ,   Abdominal Normal abdominal exam  (+)   Peds negative pediatric ROS (+)  Hematology negative hematology ROS (+)   Anesthesia Other Findings Past Medical History: No date: Allergy No date: Anxiety No date: Arthritis 02/17/2020: Atypical mole     Comment:  R epigastric/excision No date: Depression 05/26/2021: Dysplastic nevus     Comment:  left prox tricep at the axillary fold - severe - margins              free but close, recheck 12/2021 No date: GERD (gastroesophageal reflux disease) No date: Herpes simplex infection     Comment:  type 1 No date: Hyperlipidemia No date: Hypertension  Past Surgical History: No date: COLONOSCOPY WITH PROPOFOL No date: HERNIA REPAIR  BMI    Body Mass Index: 25.40 kg/m      Reproductive/Obstetrics negative OB ROS                            Anesthesia Physical  Anesthesia Plan  ASA: 2  Anesthesia Plan: General   Post-op Pain Management:    Induction:  Intravenous  PONV Risk Score and Plan: Propofol infusion and TIVA  Airway Management Planned: Nasal Cannula  Additional Equipment:   Intra-op Plan:   Post-operative Plan:   Informed Consent: I have reviewed the patients History and Physical, chart, labs and discussed the procedure including the risks, benefits and alternatives for the proposed anesthesia with the patient or authorized representative who has indicated his/her understanding and acceptance.     Dental Advisory Given  Plan Discussed with: CRNA and Surgeon  Anesthesia Plan Comments: (Patient consented for risks of anesthesia including but not limited to:  - adverse reactions to medications - risk of airway placement if required - damage to eyes, teeth, lips or other oral mucosa - nerve damage due to positioning  - sore throat or hoarseness - Damage to heart, brain, nerves, lungs, other parts of body or loss of life  Patient voiced understanding.)       Anesthesia Quick Evaluation

## 2022-10-10 NOTE — Transfer of Care (Signed)
Immediate Anesthesia Transfer of Care Note  Patient: Chelsea Mol  Procedure(s) Performed: COLONOSCOPY WITH PROPOFOL  Patient Location: Endoscopy Unit  Anesthesia Type:General  Level of Consciousness: drowsy and patient cooperative  Airway & Oxygen Therapy: Patient Spontanous Breathing and Patient connected to face mask oxygen  Post-op Assessment: Report given to RN and Post -op Vital signs reviewed and stable  Post vital signs: Reviewed and stable  Last Vitals:  Vitals Value Taken Time  BP 125/78 10/10/22 0749  Temp    Pulse 58 10/10/22 0749  Resp 17 10/10/22 0749  SpO2 100 % 10/10/22 0749    Last Pain:  Vitals:   10/10/22 0749  TempSrc:   PainSc: Asleep         Complications: No notable events documented.

## 2022-10-10 NOTE — Op Note (Signed)
[facility] Patient Name: Terry Franco Procedure Date: 10/10/2022 7:28 AM MRN: [id number] Account #: [id number] Date of Birth: 1961-06-26 Admit Type: Outpatient Age: 61 Room: [facility] ENDO ROOM 4 Gender: Male Note Status: Finalized Instrument Name: Jasper Riling [id number] Procedure:             Colonoscopy Indications:           High risk colon cancer surveillance: Personal history                         of colonic polyps Providers:             Lucilla Lame MD, MD Referring MD:          Jaci Standard. Rollene Rotunda (Referring MD) Medicines:             Propofol per Anesthesia Complications:         No immediate complications. Procedure:             Pre-Anesthesia Assessment:                        - Prior to the procedure, a History and Physical was                         performed, and patient medications and allergies were                         reviewed. The patient's tolerance of previous                         anesthesia was also reviewed. The risks and benefits                         of the procedure and the sedation options and risks                         were discussed with the patient. All questions were                         answered, and informed consent was obtained. Prior                         Anticoagulants: The patient has taken no previous                         anticoagulant or antiplatelet agents. ASA Grade                         Assessment: II - A patient with mild systemic disease.                         After reviewing the risks and benefits, the patient                         was deemed in satisfactory condition to undergo the                         procedure.  After obtaining informed consent, the colonoscope was                         passed under direct vision. Throughout the procedure,                         the patient's blood pressure, pulse, and oxygen                         saturations were  monitored continuously. The                         Colonoscope was introduced through the anus and                         advanced to the the cecum, identified by appendiceal                         orifice and ileocecal valve. The colonoscopy was                         performed without difficulty. The patient tolerated                         the procedure well. The quality of the bowel                         preparation was good. Findings:      The perianal and digital rectal examinations were normal.      Non-bleeding internal hemorrhoids were found during retroflexion. The       hemorrhoids were Grade I (internal hemorrhoids that do not prolapse). Impression:            - Non-bleeding internal hemorrhoids.                        - No specimens collected. Recommendation:        - Discharge patient to home.                        - Resume previous diet.                        - Continue present medications.                        - Repeat colonoscopy in 7 years for surveillance. Procedure Code(s):     --- Professional ---                        [phone], Colonoscopy, flexible; diagnostic, including                         collection of specimen(s) by brushing or washing, when                         performed (separate procedure) Diagnosis Code(s):     --- Professional ---                        Z86.010, Personal history of colonic polyps  CPT copyright 2019 American Medical Association. All rights reserved. The codes documented in this report are preliminary and upon coder review may  be revised to meet current compliance requirements. Lucilla Lame MD, MD 10/10/2022 7:47:09 AM This report has been signed electronically. Number of Addenda: 0 Note Initiated On: 10/10/2022 7:28 AM Scope Withdrawal Time: 0 hours 7 minutes 42 seconds  Total Procedure Duration: 0 hours 10 minutes 40 seconds  Estimated Blood Loss:  Estimated blood loss: none.      [facility]

## 2022-10-10 NOTE — H&P (Signed)
Lucilla Lame, MD [facility] [address] [place], [place] [address] Phone:[phone] Fax : [phone]  Primary Care Physician:  Gwyneth Sprout, FNP Primary Gastroenterologist:  Dr. Allen Norris  Pre-Procedure History & Physical: HPI:  Terry Franco is a 61 y.o. male is here for an colonoscopy.   Past Medical History:  Diagnosis Date   Allergy    Anxiety    Arthritis    Atypical mole 02/17/2020   R epigastric/excision   Depression    Dysplastic nevus 05/26/2021   left prox tricep at the axillary fold - severe - margins free but close, recheck 12/2021   GERD (gastroesophageal reflux disease)    Herpes simplex infection    type 1   Hyperlipidemia    Hypertension     Past Surgical History:  Procedure Laterality Date   COLONOSCOPY WITH PROPOFOL     COLONOSCOPY WITH PROPOFOL N/A 10/13/2021   Procedure: COLONOSCOPY WITH PROPOFOL;  Surgeon: Virgel Manifold, MD;  Location: [facility] ENDOSCOPY;  Service: Endoscopy;  Laterality: N/A;   ESOPHAGOGASTRODUODENOSCOPY (EGD) WITH PROPOFOL N/A 10/13/2021   Procedure: ESOPHAGOGASTRODUODENOSCOPY (EGD) WITH PROPOFOL;  Surgeon: Virgel Manifold, MD;  Location: [facility] ENDOSCOPY;  Service: Endoscopy;  Laterality: N/A;   HERNIA REPAIR      Prior to Admission medications   Medication Sig Start Date End Date Taking? Authorizing Provider  albuterol (VENTOLIN HFA) 108 (90 Base) MCG/ACT inhaler Inhale 2 puffs into the lungs every 6 (six) hours as needed for wheezing or shortness of breath. 10/05/22  Yes Gwyneth Sprout, FNP  amLODipine (NORVASC) 5 MG tablet Take 5 mg by mouth daily. 08/15/21  Yes [provider]  budesonide-formoterol (SYMBICORT) 80-4.5 MCG/ACT inhaler Inhale 2 puffs into the lungs 2 (two) times daily. 10/05/22  Yes Gwyneth Sprout, FNP  co-enzyme Q-10 30 MG capsule Take 1 capsule (30 mg total) by mouth 3 (three) times daily. 08/21/21  Yes Gwyneth Sprout, FNP  losartan-hydrochlorothiazide (HYZAAR) 100-25 MG per tablet Take 1  tablet by mouth daily.  12/30/14  Yes [provider]  meloxicam (MOBIC) 15 MG tablet Take 15 mg by mouth daily. 08/15/21  Yes [provider]  metoprolol succinate (TOPROL-XL) 25 MG 24 hr tablet TAKE 1 TABLET(25 MG) BY MOUTH DAILY 12/21/19  Yes Pollak, Adriana M, PA-C  predniSONE (DELTASONE) 50 MG tablet Take one medication daily, PO for 5 days. 10/05/22  Yes Gwyneth Sprout, FNP  rosuvastatin (CRESTOR) 10 MG tablet Take 1 tablet (10 mg total) by mouth daily. 08/21/21  Yes Gwyneth Sprout, FNP  valACYclovir (VALTREX) 1000 MG tablet TAKE ONE TABLET BY MOUTH ONE TIME DAILY 06/26/22  Yes Gwyneth Sprout, FNP  COVID-19 mRNA bivalent vaccine, Pfizer, (PFIZER COVID-19 VAC BIVALENT) injection Inject into the muscle. 11/04/21   Carlyle Basques, MD  Fluocinolone Acetonide Body 0.01 % OIL Apply to damp skin after showering QD PRN. 06/01/22   Ralene Bathe, MD  loratadine (CLARITIN) 10 MG tablet Take 10 mg by mouth daily. Patient not taking: Reported on 10/10/2022    [provider]  methocarbamol (ROBAXIN) 750 MG tablet Take 750 mg by mouth 3 (three) times daily.    [provider]  montelukast (SINGULAIR) 10 MG tablet Take 1 tablet (10 mg total) by mouth at bedtime. Patient not taking: Reported on 10/10/2022 10/05/22   Gwyneth Sprout, FNP  ondansetron (ZOFRAN) 4 MG tablet Take 1 tablet (4 mg total) by mouth every 8 (eight) hours as needed for nausea or vomiting. 09/01/22  Ostwalt, Janna, PA-C  pantoprazole (PROTONIX) 40 MG tablet Take 1 tablet (40 mg total) by mouth daily. Patient not taking: Reported on 10/10/2022 11/22/21   Tally Joe T, FNP  polyethylene glycol (GOLYTELY) 236 g solution Fill Golytely container to the fill line with luke warm water.  Mix well.  Drink 8 oz every 15-20 minutes until entire contents have been completed.  Do Not Eat or Drink Anything 4 hours Prior to Colonoscopy. Patient not taking: Reported on 10/10/2022 07/14/22   Lucilla Lame, MD  sucralfate  (CARAFATE) 1 GM/10ML suspension Take 10 mLs (1 g total) by mouth 4 (four) times daily -  with meals and at bedtime. Patient not taking: Reported on 01/03/2022 08/19/21   Gwyneth Sprout, FNP  traMADol (ULTRAM) 50 MG tablet Take 1 tablet by mouth 2 (two) times daily as needed. 10/02/22   [provider]  triamcinolone (NASACORT) 55 MCG/ACT AERO nasal inhaler Place 2 sprays into the nose daily.    [provider]    Allergies as of 05/22/2022 - Review Complete 01/03/2022  Allergen Reaction Noted   Ibuprofen Swelling 06/04/2015   Versed [midazolam] Nausea And Vomiting 03/21/2016   Penicillin v potassium Rash and Other (See Comments) 06/04/2015    Family History  Problem Relation Age of Onset   Cancer Mother        melanoma   Cancer Father        lung cancer   Heart disease Father    Benign prostatic hyperplasia Father    Prostate cancer Paternal Uncle    Benign prostatic hyperplasia Brother    Urolithiasis Brother    Kidney cancer Neg Hx    Kidney disease Neg Hx     Social History   Socioeconomic History   Marital status: Married    Spouse name: Not on file   Number of children: Not on file   Years of education: Not on file   Highest education level: Not on file  Occupational History   Not on file  Tobacco Use   Smoking status: Never   Smokeless tobacco: Never  Vaping Use   Vaping Use: Never used  Substance and Sexual Activity   Alcohol use: Yes    Alcohol/week: 0.0 standard drinks of alcohol   Drug use: No   Sexual activity: Not on file  Other Topics Concern   Not on file  Social History Narrative   Not on file   Social Determinants of Health   Financial Resource Strain: Not on file  Food Insecurity: Not on file  Transportation Needs: Not on file  Physical Activity: Not on file  Stress: Not on file  Social Connections: Not on file  Intimate Partner Violence: Not on file    Review of Systems: See HPI, otherwise negative ROS  Physical  Exam: BP (!) 144/85   Pulse 66   Temp (!) 96.4 F (35.8 C) (Temporal)   Resp 18   Ht '5\' 9"'$  (1.753 m)   Wt 77.1 kg   SpO2 97%   BMI 25.10 kg/m  General:   Alert,  pleasant and cooperative in NAD Head:  Normocephalic and atraumatic. Neck:  Supple; no masses or thyromegaly. Lungs:  Clear throughout to auscultation.    Heart:  Regular rate and rhythm. Abdomen:  Soft, nontender and nondistended. Normal bowel sounds, without guarding, and without rebound.   Neurologic:  Alert and  oriented x4;  grossly normal neurologically.  Impression/Plan: Terry Franco is here for an colonoscopy to be  performed for a history of adenomatous polyps on 2022   Risks, benefits, limitations, and alternatives regarding  colonoscopy have been reviewed with the patient.  Questions have been answered.  All parties agreeable.   Lucilla Lame, MD  10/10/2022, 7:27 AM

## 2022-10-10 NOTE — Anesthesia Procedure Notes (Signed)
Procedure Name: General with mask airway Date/Time: 10/10/2022 7:38 AM  Performed by: Kelton Pillar, CRNAPre-anesthesia Checklist: Patient identified, Emergency Drugs available, Suction available and Patient being monitored Patient Re-evaluated:Patient Re-evaluated prior to induction Oxygen Delivery Method: Simple face mask Induction Type: IV induction Placement Confirmation: positive ETCO2, CO2 detector and breath sounds checked- equal and bilateral Dental Injury: Teeth and Oropharynx as per pre-operative assessment

## 2022-10-10 NOTE — Anesthesia Postprocedure Evaluation (Signed)
Anesthesia Post Note  Patient: Terry Franco  Procedure(s) Performed: COLONOSCOPY WITH PROPOFOL  Patient location during evaluation: PACU Anesthesia Type: General Level of consciousness: awake and alert Pain management: pain level controlled Vital Signs Assessment: post-procedure vital signs reviewed and stable Respiratory status: spontaneous breathing, nonlabored ventilation and respiratory function stable Cardiovascular status: blood pressure returned to baseline and stable Postop Assessment: no apparent nausea or vomiting Anesthetic complications: no   No notable events documented.   Last Vitals:  Vitals:   10/10/22 0759 10/10/22 0809  BP: (!) 175/59 (!) 146/83  Pulse: (!) 50 (!) 45  Resp:  12  Temp:    SpO2: 98% 98%    Last Pain:  Vitals:   10/10/22 0809  TempSrc:   PainSc: 0-No pain                 [place] Ouch

## 2022-10-11 ENCOUNTER — Encounter: Payer: Self-pay | Admitting: Gastroenterology

## 2022-10-17 DIAGNOSIS — M461 Sacroiliitis, not elsewhere classified: Secondary | ICD-10-CM | POA: Diagnosis not present

## 2022-10-19 DIAGNOSIS — M6281 Muscle weakness (generalized): Secondary | ICD-10-CM | POA: Diagnosis not present

## 2022-10-27 ENCOUNTER — Encounter: Payer: Self-pay | Admitting: Family Medicine

## 2022-10-27 ENCOUNTER — Ambulatory Visit: Payer: Federal, State, Local not specified - PPO | Admitting: Family Medicine

## 2022-10-27 VITALS — BP 155/96 | HR 62 | Temp 97.9°F | Resp 16 | Ht 69.0 in | Wt 176.0 lb

## 2022-10-27 DIAGNOSIS — R7303 Prediabetes: Secondary | ICD-10-CM | POA: Diagnosis not present

## 2022-10-27 DIAGNOSIS — Z0000 Encounter for general adult medical examination without abnormal findings: Secondary | ICD-10-CM | POA: Diagnosis not present

## 2022-10-27 DIAGNOSIS — Z125 Encounter for screening for malignant neoplasm of prostate: Secondary | ICD-10-CM

## 2022-10-27 DIAGNOSIS — I10 Essential (primary) hypertension: Secondary | ICD-10-CM

## 2022-10-27 DIAGNOSIS — R053 Chronic cough: Secondary | ICD-10-CM

## 2022-10-27 DIAGNOSIS — R5382 Chronic fatigue, unspecified: Secondary | ICD-10-CM

## 2022-10-27 NOTE — Assessment & Plan Note (Signed)
UTD on dental and vision Things to do to keep yourself healthy  - Exercise at least 30-45 minutes a day, 3-4 days a week.  - Eat a low-fat diet with lots of fruits and vegetables, up to 7-9 servings per day.  - Seatbelts can save your life. Wear them always.  - Smoke detectors on every level of your home, check batteries every year.  - Eye Doctor - have an eye exam every 1-2 years  - Safe sex - if you may be exposed to STDs, use a condom.  - Alcohol -  If you drink, do it moderately, less than 2 drinks per day.  - Health Care Power of Attorney. Choose someone to speak for you if you are not able.  - Depression is common in our stressful world.If you're feeling down or losing interest in things you normally enjoy, please come in for a visit.  - Violence - If anyone is threatening or hurting you, please call immediately.  

## 2022-10-27 NOTE — Progress Notes (Signed)
Complete physical exam   Patient: Terry Franco   DOB: 08/19/1961   61 y.o. Male  MRN: [id number] Visit Date: 10/27/2022  Today's healthcare provider: Gwyneth Sprout, FNP  Re Introduced to nurse practitioner role and practice setting.  All questions answered.  Discussed provider/patient relationship and expectations.  I,Tiffany J Bragg,acting as a scribe for Gwyneth Sprout, FNP.,have documented all relevant documentation on the behalf of Gwyneth Sprout, FNP,as directed by  Gwyneth Sprout, FNP while in the presence of Gwyneth Sprout, FNP.   Chief Complaint  Patient presents with   Annual Exam   Subjective    Terry Franco is a 61 y.o. male who presents today for a complete physical exam.  He reports consuming a general diet.  Exercise is going to the gym and golfing.  He generally feels poorly. He reports sleeping poorly. He does have additional problems to discuss today. States he still does not feel recovered from having covid. HPI   Past Medical History:  Diagnosis Date   Allergy    Anxiety    Arthritis    Atypical mole 02/17/2020   R epigastric/excision   Depression    Dysplastic nevus 05/26/2021   left prox tricep at the axillary fold - severe - margins free but close, recheck 12/2021   GERD (gastroesophageal reflux disease)    Herpes simplex infection    type 1   Hyperlipidemia    Hypertension    Past Surgical History:  Procedure Laterality Date   COLONOSCOPY WITH PROPOFOL     COLONOSCOPY WITH PROPOFOL N/A 10/13/2021   Procedure: COLONOSCOPY WITH PROPOFOL;  Surgeon: Virgel Manifold, MD;  Location: [facility] ENDOSCOPY;  Service: Endoscopy;  Laterality: N/A;   COLONOSCOPY WITH PROPOFOL N/A 10/10/2022   Procedure: COLONOSCOPY WITH PROPOFOL;  Surgeon: Lucilla Lame, MD;  Location: [facility] ENDOSCOPY;  Service: Endoscopy;  Laterality: N/A;   ESOPHAGOGASTRODUODENOSCOPY (EGD) WITH PROPOFOL N/A 10/13/2021   Procedure: ESOPHAGOGASTRODUODENOSCOPY (EGD) WITH PROPOFOL;   Surgeon: Virgel Manifold, MD;  Location: [facility] ENDOSCOPY;  Service: Endoscopy;  Laterality: N/A;   HERNIA REPAIR     Social History   Socioeconomic History   Marital status: Married    Spouse name: Not on file   Number of children: Not on file   Years of education: Not on file   Highest education level: Not on file  Occupational History   Not on file  Tobacco Use   Smoking status: Never   Smokeless tobacco: Never  Vaping Use   Vaping Use: Never used  Substance and Sexual Activity   Alcohol use: Yes    Alcohol/week: 0.0 standard drinks of alcohol   Drug use: No   Sexual activity: Not on file  Other Topics Concern   Not on file  Social History Narrative   Not on file   Social Determinants of Health   Financial Resource Strain: Not on file  Food Insecurity: Not on file  Transportation Needs: Not on file  Physical Activity: Not on file  Stress: Not on file  Social Connections: Not on file  Intimate Partner Violence: Not on file   Family Status  Relation Name Status   Mother  Deceased   Father  Deceased   Annamarie Major  (Not Specified)   Brother  (Not Specified)   Neg Hx  (Not Specified)   Family History  Problem Relation Age of Onset   Cancer Mother        melanoma   Cancer  Father        lung cancer   Heart disease Father    Benign prostatic hyperplasia Father    Prostate cancer Paternal Uncle    Benign prostatic hyperplasia Brother    Urolithiasis Brother    Kidney cancer Neg Hx    Kidney disease Neg Hx    Allergies  Allergen Reactions   Ibuprofen Swelling   Versed [Midazolam] Nausea And Vomiting   Penicillin V Potassium Rash and Other (See Comments)    Has patient had a PCN reaction causing immediate rash, facial/tongue/throat swelling, SOB or lightheadedness with hypotension: unknown Has patient had a PCN reaction causing severe rash involving mucus membranes or skin necrosis: unknown Has patient had a PCN reaction that required hospitalization  No Has patient had a PCN reaction occurring within the last 10 years: No If all of the above answers are "NO", then may proceed with Cephalosporin use.     Patient Care Team: Gwyneth Sprout, FNP as PCP - General (Family Medicine)   Medications: Outpatient Medications Prior to Visit  Medication Sig   albuterol (VENTOLIN HFA) 108 (90 Base) MCG/ACT inhaler Inhale 2 puffs into the lungs every 6 (six) hours as needed for wheezing or shortness of breath.   amLODipine (NORVASC) 5 MG tablet Take 5 mg by mouth daily.   budesonide-formoterol (SYMBICORT) 80-4.5 MCG/ACT inhaler Inhale 2 puffs into the lungs 2 (two) times daily.   co-enzyme Q-10 30 MG capsule Take 1 capsule (30 mg total) by mouth 3 (three) times daily.   COVID-19 mRNA bivalent vaccine, Pfizer, (PFIZER COVID-19 VAC BIVALENT) injection Inject into the muscle.   Fluocinolone Acetonide Body 0.01 % OIL Apply to damp skin after showering QD PRN.   losartan-hydrochlorothiazide (HYZAAR) 100-25 MG per tablet Take 1 tablet by mouth daily.    meloxicam (MOBIC) 15 MG tablet Take 15 mg by mouth daily.   methocarbamol (ROBAXIN) 750 MG tablet Take 750 mg by mouth 3 (three) times daily.   metoprolol succinate (TOPROL-XL) 25 MG 24 hr tablet TAKE 1 TABLET(25 MG) BY MOUTH DAILY   ondansetron (ZOFRAN) 4 MG tablet Take 1 tablet (4 mg total) by mouth every 8 (eight) hours as needed for nausea or vomiting.   rosuvastatin (CRESTOR) 10 MG tablet Take 1 tablet (10 mg total) by mouth daily.   triamcinolone (NASACORT) 55 MCG/ACT AERO nasal inhaler Place 2 sprays into the nose daily.   valACYclovir (VALTREX) 1000 MG tablet TAKE ONE TABLET BY MOUTH ONE TIME DAILY   loratadine (CLARITIN) 10 MG tablet Take 10 mg by mouth daily. (Patient not taking: Reported on 10/10/2022)   pantoprazole (PROTONIX) 40 MG tablet Take 1 tablet (40 mg total) by mouth daily. (Patient not taking: Reported on 10/10/2022)   traMADol (ULTRAM) 50 MG tablet Take 1 tablet by mouth 2 (two) times  daily as needed. (Patient not taking: Reported on 10/27/2022)   [DISCONTINUED] montelukast (SINGULAIR) 10 MG tablet Take 1 tablet (10 mg total) by mouth at bedtime.   [DISCONTINUED] polyethylene glycol (GOLYTELY) 236 g solution Fill Golytely container to the fill line with luke warm water.  Mix well.  Drink 8 oz every 15-20 minutes until entire contents have been completed.  Do Not Eat or Drink Anything 4 hours Prior to Colonoscopy.   [DISCONTINUED] predniSONE (DELTASONE) 50 MG tablet Take one medication daily, PO for 5 days.   [DISCONTINUED] sucralfate (CARAFATE) 1 GM/10ML suspension Take 10 mLs (1 g total) by mouth 4 (four) times daily -  with meals and  at bedtime. (Patient not taking: Reported on 01/03/2022)   No facility-administered medications prior to visit.    Review of Systems  Constitutional:  Positive for fatigue.  HENT:  Positive for congestion, postnasal drip, sinus pressure, sneezing and voice change.   Respiratory:  Positive for cough, choking and shortness of breath.   Gastrointestinal:  Positive for nausea.  Musculoskeletal:  Positive for arthralgias, back pain, gait problem and myalgias.  Allergic/Immunologic: Positive for environmental allergies.  Neurological:  Positive for headaches.    Objective    BP (!) 155/96 (BP Location: Left Arm, Patient Position: Sitting, Cuff Size: Large)   Pulse 62   Temp 97.9 F (36.6 C) (Oral)   Resp 16   Ht '5\' 9"'$  (1.753 m)   Wt 176 lb (79.8 kg)   SpO2 98%   BMI 25.99 kg/m    Physical Exam Vitals and nursing note reviewed.  Constitutional:      General: He is awake. He is not in acute distress.    Appearance: Normal appearance. He is well-developed, well-groomed and overweight. He is not ill-appearing, toxic-appearing or diaphoretic.  HENT:     Head: Normocephalic and atraumatic.     Jaw: There is normal jaw occlusion. No trismus, tenderness, swelling or pain on movement.     Salivary Glands: Right salivary gland is not  diffusely enlarged or tender. Left salivary gland is not diffusely enlarged or tender.     Right Ear: Hearing, tympanic membrane, ear canal and external ear normal. There is no impacted cerumen.     Left Ear: Hearing, tympanic membrane, ear canal and external ear normal. There is no impacted cerumen.     Nose: Nose normal. No congestion or rhinorrhea.     Right Turbinates: Not enlarged, swollen or pale.     Left Turbinates: Not enlarged, swollen or pale.     Right Sinus: No maxillary sinus tenderness or frontal sinus tenderness.     Left Sinus: No maxillary sinus tenderness or frontal sinus tenderness.     Mouth/Throat:     Lips: Pink.     Mouth: Mucous membranes are moist. No injury, lacerations, oral lesions or angioedema.     Pharynx: Oropharynx is clear. Uvula midline. No pharyngeal swelling, oropharyngeal exudate or posterior oropharyngeal erythema.     Tonsils: No tonsillar exudate or tonsillar abscesses.  Eyes:     General: Lids are normal. Vision grossly intact. Gaze aligned appropriately.        Right eye: No discharge.        Left eye: No discharge.     Extraocular Movements: Extraocular movements intact.     Conjunctiva/sclera: Conjunctivae normal.     Pupils: Pupils are equal, round, and reactive to light.  Neck:     Thyroid: No thyroid mass, thyromegaly or thyroid tenderness.     Vascular: No carotid bruit.     Trachea: Trachea normal. No tracheal tenderness.     Comments: Hoarse voice, voice change Cardiovascular:     Rate and Rhythm: Normal rate and regular rhythm.     Pulses: Normal pulses.          Carotid pulses are 2+ on the right side and 2+ on the left side.      Radial pulses are 2+ on the right side and 2+ on the left side.       Femoral pulses are 2+ on the right side and 2+ on the left side.      Popliteal pulses are 2+  on the right side and 2+ on the left side.       Dorsalis pedis pulses are 2+ on the right side and 2+ on the left side.       Posterior  tibial pulses are 2+ on the right side and 2+ on the left side.     Heart sounds: Normal heart sounds, S1 normal and S2 normal. No murmur heard.    No friction rub. No gallop.  Pulmonary:     Effort: Pulmonary effort is normal. No respiratory distress.     Breath sounds: Normal breath sounds and air entry. No stridor. No wheezing, rhonchi or rales.  Chest:     Chest wall: No tenderness.  Abdominal:     General: Abdomen is flat. Bowel sounds are normal. There is no distension.     Palpations: Abdomen is soft. There is no mass.     Tenderness: There is no abdominal tenderness. There is no guarding or rebound.     Hernia: No hernia is present.  Genitourinary:    Comments: Exam deferred; denies complaints Musculoskeletal:        General: No swelling, tenderness, deformity or signs of injury. Normal range of motion.     Cervical back: Normal range of motion and neck supple. No rigidity or tenderness.     Right lower leg: No edema.     Left lower leg: No edema.  Lymphadenopathy:     Cervical: No cervical adenopathy.     Right cervical: No superficial, deep or posterior cervical adenopathy.    Left cervical: No superficial, deep or posterior cervical adenopathy.  Skin:    General: Skin is warm and dry.     Capillary Refill: Capillary refill takes less than 2 seconds.     Coloration: Skin is not jaundiced or pale.     Findings: No bruising, erythema, lesion or rash.  Neurological:     General: No focal deficit present.     Mental Status: He is alert and oriented to person, place, and time. Mental status is at baseline.     GCS: GCS eye subscore is 4. GCS verbal subscore is 5. GCS motor subscore is 6.     Sensory: Sensation is intact. No sensory deficit.     Motor: Motor function is intact. No weakness.     Coordination: Coordination is intact.     Gait: Gait is intact.  Psychiatric:        Attention and Perception: Attention and perception normal.        Mood and Affect: Mood and  affect normal.        Speech: Speech normal.        Behavior: Behavior normal. Behavior is cooperative.        Thought Content: Thought content normal.        Cognition and Memory: Cognition normal.        Judgment: Judgment normal.     Last depression screening scores    10/27/2022    8:51 AM 10/05/2022    8:59 AM 10/10/2021   10:46 AM  PHQ 2/9 Scores  PHQ - 2 Score 0 0 0  PHQ- 9 Score '2 2 3   '$ Last fall risk screening    10/27/2022    8:51 AM  [place] in the past year? 0  Number falls in past yr: 0  Injury with Fall? 0  Risk for fall due to : No Fall Risks  Follow up Falls  evaluation completed   Last Audit-C alcohol use screening    10/27/2022    8:51 AM  Alcohol Use Disorder Test (AUDIT)  1. How often do you have a drink containing alcohol? 1  2. How many drinks containing alcohol do you have on a typical day when you are drinking? 0  3. How often do you have six or more drinks on one occasion? 0  AUDIT-C Score 1   A score of 3 or more in women, and 4 or more in men indicates increased risk for alcohol abuse, EXCEPT if all of the points are from question 1   No results found for any visits on 10/27/22.  Assessment & Plan    Routine Health Maintenance and Physical Exam  Exercise Activities and Dietary recommendations  Goals   None     Immunization History  Administered Date(s) Administered   Influenza Split 10/17/2016   Influenza,inj,Quad PF,6+ Mos 09/23/2017, 09/24/2019   Influenza-Unspecified 09/23/2017, 08/13/2021   PFIZER Comirnaty(Gray Top)Covid-19 Tri-Sucrose Vaccine 03/23/2021   PFIZER(Purple Top)SARS-COV-2 Vaccination 03/06/2020, 03/27/2020, 10/07/2020   Pfizer Covid-19 Vaccine Bivalent Booster 57yr & up 11/04/2021   Pneumococcal Conjugate-13 10/17/2016   Pneumococcal Polysaccharide-23 09/23/2017, 04/29/2019   Tdap 12/22/2017, 04/29/2019   Zoster Recombinat (Shingrix) 11/23/2021    Health Maintenance  Topic Date Due   COVID-19  Vaccine (6 - Pfizer risk series) 12/30/2021   Zoster Vaccines- Shingrix (2 of 2) 01/18/2022   INFLUENZA VACCINE  07/18/2022   COLONOSCOPY (Pts 45-444yrInsurance coverage will need to be confirmed)  10/11/2023   TETANUS/TDAP  04/28/2029   Hepatitis C Screening  Completed   HIV Screening  Completed   HPV VACCINES  Aged Out    Discussed health benefits of physical activity, and encouraged him to engage in regular exercise appropriate for his age and condition.  Problem List Items Addressed This Visit       Cardiovascular and Mediastinum   BP (high blood pressure)    Chronic, elevated today Previously controlled with use of hyzaar 100-25 and topril 25 mg  Check labs to assist; has cardiology follow up pending       Relevant Orders   CBC with Differential/Platelet   Comprehensive Metabolic Panel (CMET)   Lipid panel     Other   Annual physical exam - Primary    UTD on dental and vision Things to do to keep yourself healthy  - Exercise at least 30-45 minutes a day, 3-4 days a week.  - Eat a low-fat diet with lots of fruits and vegetables, up to 7-9 servings per day.  - Seatbelts can save your life. Wear them always.  - Smoke detectors on every level of your home, check batteries every year.  - Eye Doctor - have an eye exam every 1-2 years  - Safe sex - if you may be exposed to STDs, use a condom.  - Alcohol -  If you drink, do it moderately, less than 2 drinks per day.  - [place] someone to speak for you if you are not able.  - Depression is common in our stressful world.If you're feeling down or losing interest in things you normally enjoy, please come in for a visit.  - Violence - If anyone is threatening or hurting you, please call immediately.       Relevant Orders   CBC with Differential/Platelet   Comprehensive Metabolic Panel (CMET)   Lipid panel   TSH   Borderline diabetes  Chronic, stable Controlled with lifestyle Repeat  A1c Continue to recommend balanced, lower carb meals. Smaller meal size, adding snacks. Choosing water as drink of choice and increasing purposeful exercise.       Relevant Orders   Hemoglobin A1c   Chronic cough    Chronic, remains worse since recent COVID Has tried multi step treatments Referral to pulm      Relevant Orders   Ambulatory referral to Pulmonology   Chronic fatigue    Chronic, worse since covid Has been traveling to care for aging FIL No sick contacts Denies excess stressors Unknown cause       Relevant Orders   Vitamin D (25 hydroxy)   B12 and Folate Panel   Screening for prostate cancer    Denies LUTS; recommend PSA in place of DRE. If PSA is elevated for age, we will repeat; if PSA remains elevated pt will be referred to urology for DRE and next steps for best treatment.       Relevant Orders   PSA     Return in about 6 months (around 04/27/2023) for chonic disease management.     Vonna Kotyk, FNP, have reviewed all documentation for this visit. The documentation on 10/27/22 for the exam, diagnosis, procedures, and orders are all accurate and complete.    Gwyneth Sprout, [place] [phone] (phone) [phone] (fax)  [place]

## 2022-10-27 NOTE — Assessment & Plan Note (Signed)
Chronic, stable Controlled with lifestyle Repeat A1c Continue to recommend balanced, lower carb meals. Smaller meal size, adding snacks. Choosing water as drink of choice and increasing purposeful exercise.

## 2022-10-27 NOTE — Assessment & Plan Note (Signed)
Chronic, worse since covid Has been traveling to care for aging FIL No sick contacts Denies excess stressors Unknown cause

## 2022-10-27 NOTE — Assessment & Plan Note (Signed)
Chronic, remains worse since recent COVID Has tried multi step treatments Referral to pulm

## 2022-10-27 NOTE — Assessment & Plan Note (Signed)
Chronic, elevated today Previously controlled with use of hyzaar 100-25 and topril 25 mg  Check labs to assist; has cardiology follow up pending

## 2022-10-27 NOTE — Assessment & Plan Note (Signed)
Denies LUTS; recommend PSA in place of DRE. If PSA is elevated for age, we will repeat; if PSA remains elevated pt will be referred to urology for DRE and next steps for best treatment.  

## 2022-10-28 LAB — LIPID PANEL
Chol/HDL Ratio: 3.6 ratio (ref 0.0–5.0)
Cholesterol, Total: 196 mg/dL (ref 100–199)
HDL: 55 mg/dL (ref >39)
LDL Chol Calc (NIH): 120 mg/dL — ABNORMAL HIGH (ref 0–99)
Triglycerides: 118 mg/dL (ref 0–149)
VLDL Cholesterol Cal: 21 mg/dL (ref 5–40)

## 2022-10-28 LAB — COMPREHENSIVE METABOLIC PANEL
ALT: 70 IU/L — ABNORMAL HIGH (ref 0–44)
AST: 34 IU/L (ref 0–40)
Albumin/Globulin Ratio: 2.3 — ABNORMAL HIGH (ref 1.2–2.2)
Albumin: 5 g/dL — ABNORMAL HIGH (ref 3.9–4.9)
Alkaline Phosphatase: 64 IU/L (ref 44–121)
BUN/Creatinine Ratio: 16 (ref 10–24)
BUN: 19 mg/dL (ref 8–27)
Bilirubin Total: 0.6 mg/dL (ref 0.0–1.2)
CO2: 24 mmol/L (ref 20–29)
Calcium: 10.4 mg/dL — ABNORMAL HIGH (ref 8.6–10.2)
Chloride: 104 mmol/L (ref 96–106)
Creatinine, Ser: 1.19 mg/dL (ref 0.76–1.27)
Globulin, Total: 2.2 g/dL (ref 1.5–4.5)
Glucose: 103 mg/dL — ABNORMAL HIGH (ref 70–99)
Potassium: 4.2 mmol/L (ref 3.5–5.2)
Sodium: 146 mmol/L — ABNORMAL HIGH (ref 134–144)
Total Protein: 7.2 g/dL (ref 6.0–8.5)
eGFR: 69 mL/min/{1.73_m2} (ref >59)

## 2022-10-28 LAB — HEMOGLOBIN A1C
Est. average glucose Bld gHb Est-mCnc: 114 mg/dL
Hgb A1c MFr Bld: 5.6 % (ref 4.8–5.6)

## 2022-10-28 LAB — CBC WITH DIFFERENTIAL/PLATELET
Basophils Absolute: 0.1 10*3/uL (ref 0.0–0.2)
Basos: 1 %
EOS (ABSOLUTE): 0.2 10*3/uL (ref 0.0–0.4)
Eos: 3 %
Hematocrit: 44.3 % (ref 37.5–51.0)
Hemoglobin: 15.9 g/dL (ref 13.0–17.7)
Immature Grans (Abs): 0 10*3/uL (ref 0.0–0.1)
Immature Granulocytes: 0 %
Lymphocytes Absolute: 2.3 10*3/uL (ref 0.7–3.1)
Lymphs: 33 %
MCH: 33.8 pg — ABNORMAL HIGH (ref 26.6–33.0)
MCHC: 35.9 g/dL — ABNORMAL HIGH (ref 31.5–35.7)
MCV: 94 fL (ref 79–97)
Monocytes Absolute: 0.7 10*3/uL (ref 0.1–0.9)
Monocytes: 10 %
Neutrophils Absolute: 3.6 10*3/uL (ref 1.4–7.0)
Neutrophils: 53 %
Platelets: 277 10*3/uL (ref 150–450)
RBC: 4.7 x10E6/uL (ref 4.14–5.80)
RDW: 13.6 % (ref 11.6–15.4)
WBC: 6.8 10*3/uL (ref 3.4–10.8)

## 2022-10-28 LAB — TSH: TSH: 3.05 u[IU]/mL (ref 0.450–4.500)

## 2022-10-28 LAB — B12 AND FOLATE PANEL
Folate: 17.2 ng/mL (ref >3.0)
Vitamin B-12: 480 pg/mL (ref 232–1245)

## 2022-10-28 LAB — VITAMIN D 25 HYDROXY (VIT D DEFICIENCY, FRACTURES): Vit D, 25-Hydroxy: 27.5 ng/mL — ABNORMAL LOW (ref 30.0–100.0)

## 2022-10-28 LAB — PSA: Prostate Specific Ag, Serum: 2.3 ng/mL (ref 0.0–4.0)

## 2022-10-31 ENCOUNTER — Encounter: Payer: Self-pay | Admitting: Family Medicine

## 2022-10-31 DIAGNOSIS — M545 Low back pain, unspecified: Secondary | ICD-10-CM | POA: Diagnosis not present

## 2022-10-31 DIAGNOSIS — M6281 Muscle weakness (generalized): Secondary | ICD-10-CM | POA: Diagnosis not present

## 2022-11-01 ENCOUNTER — Other Ambulatory Visit: Payer: Self-pay | Admitting: Family Medicine

## 2022-11-01 NOTE — Progress Notes (Signed)
Continued low levels of Vit D; recommend increase in dietary sources or can use supplemental D3 to help.  Blood chemistry likely shows slight dehydration with elevated sodium and calcium; ALT elevation has returned. Continue to monitor diet, use of NSAIDs like Advil and alcohol use if applicable.  Cholesterol is improved; however, bad/LDL remains elevated. Stroke/heart attack risk remains  moderate-high at 15%. Can increase crestor if desired.  The 10-year ASCVD risk score (Arnett DK, et al., 2019) is: 15%   Values used to calculate the score:     Age: 61 years     Sex: Male     Is Non-Hispanic African American: No     Diabetic: No     Tobacco smoker: No     Systolic Blood Pressure: 425 mmHg     Is BP treated: Yes     HDL Cholesterol: 55 mg/dL     Total Cholesterol: 196 mg/dL

## 2022-11-01 NOTE — Telephone Encounter (Signed)
Spoke with patient and advised

## 2022-11-17 ENCOUNTER — Ambulatory Visit: Payer: Federal, State, Local not specified - PPO | Admitting: Internal Medicine

## 2022-11-22 DIAGNOSIS — M545 Low back pain, unspecified: Secondary | ICD-10-CM | POA: Diagnosis not present

## 2022-12-05 DIAGNOSIS — G8929 Other chronic pain: Secondary | ICD-10-CM | POA: Diagnosis not present

## 2022-12-05 DIAGNOSIS — M25551 Pain in right hip: Secondary | ICD-10-CM | POA: Diagnosis not present

## 2022-12-21 DIAGNOSIS — M545 Low back pain, unspecified: Secondary | ICD-10-CM | POA: Diagnosis not present

## 2023-01-18 DIAGNOSIS — M545 Low back pain, unspecified: Secondary | ICD-10-CM | POA: Diagnosis not present

## 2023-02-15 DIAGNOSIS — M545 Low back pain, unspecified: Secondary | ICD-10-CM | POA: Diagnosis not present

## 2023-02-23 ENCOUNTER — Other Ambulatory Visit: Payer: Self-pay | Admitting: Family Medicine

## 2023-02-23 DIAGNOSIS — K120 Recurrent oral aphthae: Secondary | ICD-10-CM

## 2023-03-20 DIAGNOSIS — M167 Other unilateral secondary osteoarthritis of hip: Secondary | ICD-10-CM | POA: Diagnosis not present

## 2023-03-20 DIAGNOSIS — M25851 Other specified joint disorders, right hip: Secondary | ICD-10-CM | POA: Diagnosis not present

## 2023-04-17 ENCOUNTER — Encounter: Payer: Self-pay | Admitting: Family Medicine

## 2023-04-17 ENCOUNTER — Ambulatory Visit: Payer: Federal, State, Local not specified - PPO | Admitting: Family Medicine

## 2023-04-17 VITALS — BP 137/75 | HR 73 | Temp 97.9°F | Ht 69.0 in | Wt 180.8 lb

## 2023-04-17 DIAGNOSIS — H6991 Unspecified Eustachian tube disorder, right ear: Secondary | ICD-10-CM | POA: Diagnosis not present

## 2023-04-17 DIAGNOSIS — J301 Allergic rhinitis due to pollen: Secondary | ICD-10-CM | POA: Diagnosis not present

## 2023-04-17 DIAGNOSIS — I10 Essential (primary) hypertension: Secondary | ICD-10-CM

## 2023-04-17 DIAGNOSIS — H938X1 Other specified disorders of right ear: Secondary | ICD-10-CM | POA: Diagnosis not present

## 2023-04-17 MED ORDER — MONTELUKAST SODIUM 10 MG PO TABS: 10.0000 mg | ORAL_TABLET | Freq: Every day | ORAL | 3 refills | Status: DC

## 2023-04-17 MED ORDER — PREDNISONE 50 MG PO TABS: 50.0000 mg | ORAL_TABLET | Freq: Every day | ORAL | 0 refills | Status: DC

## 2023-04-17 NOTE — Assessment & Plan Note (Signed)
Clear fluid on R ear; recommend increase in nasa cort as well as trial of zyrtec vs claritin and steroids to assist with inflammatory; RTC as needed

## 2023-04-17 NOTE — Assessment & Plan Note (Signed)
Chronic, improved BP at goal of <140/<90 Continue norvasc 5 mg

## 2023-04-17 NOTE — Progress Notes (Signed)
I,J'ya E Hunter,acting as a scribe for Jacky Kindle, FNP.,have documented all relevant documentation on the behalf of Jacky Kindle, FNP,as directed by  Jacky Kindle, FNP while in the presence of Jacky Kindle, FNP.  Established patient visit  Patient: Terry Franco   DOB: 1961/02/07   62 y.o. Male  MRN: [id number] Visit Date: 04/17/2023  Today's healthcare provider: Jacky Kindle, FNP  Re Introduced to nurse practitioner role and practice setting.  All questions answered.  Discussed provider/patient relationship and expectations.  Chief Complaint  Patient presents with   Otalgia   Subjective    Otalgia  There is pain in the right ear. This is a new problem. Episode onset: 3 weeks. The problem has been gradually worsening. The pain is mild. Associated symptoms include hearing loss. Pertinent negatives include no ear discharge or headaches. Associated symptoms comments: Tinnitus . He has tried ear drops (Debrox and Cotton Swabs) for the symptoms. The treatment provided no relief. PMH includes Tinnitus        Medications: Outpatient Medications Prior to Visit  Medication Sig   amLODipine (NORVASC) 5 MG tablet Take 5 mg by mouth daily.   co-enzyme Q-10 30 MG capsule Take 1 capsule (30 mg total) by mouth 3 (three) times daily.   Fluocinolone Acetonide Body 0.01 % OIL Apply to damp skin after showering QD PRN.   loratadine (CLARITIN) 10 MG tablet Take 10 mg by mouth daily.   losartan-hydrochlorothiazide (HYZAAR) 100-25 MG per tablet Take 1 tablet by mouth daily.    meloxicam (MOBIC) 15 MG tablet Take 1 tablet by mouth daily.   methocarbamol (ROBAXIN) 750 MG tablet Take 750 mg by mouth 3 (three) times daily.   metoprolol succinate (TOPROL-XL) 25 MG 24 hr tablet TAKE 1 TABLET(25 MG) BY MOUTH DAILY   pantoprazole (PROTONIX) 40 MG tablet Take 1 tablet (40 mg total) by mouth daily.   rosuvastatin (CRESTOR) 10 MG tablet Take 1 tablet (10 mg total) by mouth daily.   traMADol (ULTRAM)  50 MG tablet Take 1 tablet by mouth 2 (two) times daily as needed for severe pain.   triamcinolone (NASACORT) 55 MCG/ACT AERO nasal inhaler Place 2 sprays into the nose daily.   valACYclovir (VALTREX) 1000 MG tablet TAKE ONE TABLET BY MOUTH ONE TIME DAILY   [DISCONTINUED] albuterol (VENTOLIN HFA) 108 (90 Base) MCG/ACT inhaler Inhale 2 puffs into the lungs every 6 (six) hours as needed for wheezing or shortness of breath. (Patient not taking: Reported on 04/17/2023)   [DISCONTINUED] budesonide-formoterol (SYMBICORT) 80-4.5 MCG/ACT inhaler Inhale 2 puffs into the lungs 2 (two) times daily. (Patient not taking: Reported on 04/17/2023)   [DISCONTINUED] ondansetron (ZOFRAN) 4 MG tablet Take 1 tablet (4 mg total) by mouth every 8 (eight) hours as needed for nausea or vomiting. (Patient not taking: Reported on 04/17/2023)   No facility-administered medications prior to visit.    Review of Systems  HENT:  Positive for ear pain and hearing loss. Negative for ear discharge.   Neurological:  Negative for headaches.     Objective    BP 137/75 (BP Location: Right Arm, Patient Position: Sitting, Cuff Size: Large)   Pulse 73   Temp 97.9 F (36.6 C) (Oral)   Ht 5\' 9"  (1.753 m)   Wt 180 lb 12.8 oz (82 kg)   SpO2 100%   BMI 26.70 kg/m   Physical Exam Vitals and nursing note reviewed.  Constitutional:      Appearance: Normal appearance. He  is overweight.  HENT:     Head: Normocephalic and atraumatic.     Right Ear: Ear canal and external ear normal. Swelling present. A middle ear effusion is present.     Left Ear: Tympanic membrane, ear canal and external ear normal.     Ears:     Comments: Clear fluid on R ear; recommend increase in nasa cort as well as trial of zyrtec vs claritin and steroids to assist with inflammatory; RTC as needed    Nose: Congestion present.     Mouth/Throat:     Mouth: Mucous membranes are moist.     Pharynx: Oropharynx is clear. No oropharyngeal exudate or posterior  oropharyngeal erythema.  Eyes:     Extraocular Movements: Extraocular movements intact.     Conjunctiva/sclera: Conjunctivae normal.     Pupils: Pupils are equal, round, and reactive to light.  Cardiovascular:     Rate and Rhythm: Normal rate and regular rhythm.     Pulses: Normal pulses.     Heart sounds: Normal heart sounds.  Pulmonary:     Effort: Pulmonary effort is normal. No respiratory distress.     Breath sounds: Normal breath sounds. No stridor. No wheezing, rhonchi or rales.  Chest:     Chest wall: No tenderness.  Musculoskeletal:        General: Normal range of motion.     Cervical back: Normal range of motion and neck supple. No tenderness.  Skin:    General: Skin is warm and dry.     Capillary Refill: Capillary refill takes less than 2 seconds.  Neurological:     General: No focal deficit present.     Mental Status: He is alert and oriented to person, place, and time. Mental status is at baseline.  Psychiatric:        Mood and Affect: Mood normal.        Behavior: Behavior normal.        Thought Content: Thought content normal.        Judgment: Judgment normal.     No results found for any visits on 04/17/23.  Assessment & Plan     Problem List Items Addressed This Visit       Cardiovascular and Mediastinum   BP (high blood pressure)    Chronic, improved BP at goal of <140/<90 Continue norvasc 5 mg        Respiratory   Allergic rhinitis    Acute on chronic, recent exacerbation with R ear fullness following time outside and yardwork         Nervous and Auditory   Ear fullness, right - Primary    Acute, worsening in past 1-2 days; ongoing tinnitus x3 weeks      Eustachian tube dysfunction, right    Clear fluid on R ear; recommend increase in nasa cort as well as trial of zyrtec vs claritin and steroids to assist with inflammatory; RTC as needed      Return if symptoms worsen or fail to improve.     Leilani Merl, FNP, have reviewed all  documentation for this visit. The documentation on 04/17/23 for the exam, diagnosis, procedures, and orders are all accurate and complete.  Jacky Kindle, FNP  [facility] [phone] (phone) [phone] (fax)  [facility]

## 2023-04-17 NOTE — Assessment & Plan Note (Signed)
Acute, worsening in past 1-2 days; ongoing tinnitus x3 weeks

## 2023-04-17 NOTE — Assessment & Plan Note (Signed)
Acute on chronic, recent exacerbation with R ear fullness following time outside and yardwork

## 2023-05-31 DIAGNOSIS — M25551 Pain in right hip: Secondary | ICD-10-CM | POA: Diagnosis not present

## 2023-06-07 ENCOUNTER — Ambulatory Visit: Payer: Self-pay | Admitting: Dermatology

## 2023-06-07 ENCOUNTER — Ambulatory Visit: Payer: Federal, State, Local not specified - PPO | Admitting: Dermatology

## 2023-06-07 VITALS — BP 137/75

## 2023-06-07 DIAGNOSIS — Z1283 Encounter for screening for malignant neoplasm of skin: Secondary | ICD-10-CM | POA: Diagnosis not present

## 2023-06-07 DIAGNOSIS — D229 Melanocytic nevi, unspecified: Secondary | ICD-10-CM | POA: Diagnosis not present

## 2023-06-07 DIAGNOSIS — L578 Other skin changes due to chronic exposure to nonionizing radiation: Secondary | ICD-10-CM

## 2023-06-07 DIAGNOSIS — L814 Other melanin hyperpigmentation: Secondary | ICD-10-CM | POA: Diagnosis not present

## 2023-06-07 DIAGNOSIS — L821 Other seborrheic keratosis: Secondary | ICD-10-CM

## 2023-06-07 DIAGNOSIS — W908XXA Exposure to other nonionizing radiation, initial encounter: Secondary | ICD-10-CM

## 2023-06-07 DIAGNOSIS — Z86018 Personal history of other benign neoplasm: Secondary | ICD-10-CM

## 2023-06-07 NOTE — Progress Notes (Signed)
   Follow-Up Visit   Subjective  Terry Franco is a 62 y.o. male who presents for the following: Skin Cancer Screening and Full Body Skin Exam The patient presents for Total-Body Skin Exam (TBSE) for skin cancer screening and mole check. The patient has spots, moles and lesions to be evaluated, some may be new or changing and the patient has concerns that these could be cancer.  Hx dysplastic nevi. Patient has a spot at nasal tip that sometimes itches, present at visit last year and was told we would monitor.   The following portions of the chart were reviewed this encounter and updated as appropriate: medications, allergies, medical history  Review of Systems:  No other skin or systemic complaints except as noted in HPI or Assessment and Plan.  Objective  Well appearing patient in no apparent distress; mood and affect are within normal limits.  A full examination was performed including scalp, head, eyes, ears, nose, lips, neck, chest, axillae, abdomen, back, buttocks, bilateral upper extremities, bilateral lower extremities, hands, feet, fingers, toes, fingernails, and toenails. All findings within normal limits unless otherwise noted below.   Relevant physical exam findings are noted in the Assessment and Plan.   Assessment & Plan   LENTIGINES, SEBORRHEIC KERATOSES, HEMANGIOMAS - Benign normal skin lesions - Benign-appearing - Call for any changes  MELANOCYTIC NEVI - Tan-brown and/or pink-flesh-colored symmetric macules and papules - Benign appearing on exam today - Observation - Call clinic for new or changing moles - Recommend daily use of broad spectrum spf 30+ sunscreen to sun-exposed areas.   ACTINIC DAMAGE - Chronic condition, secondary to cumulative UV/sun exposure - diffuse scaly erythematous macules with underlying dyspigmentation - Recommend daily broad spectrum sunscreen SPF 30+ to sun-exposed areas, reapply every 2 hours as needed.  - Staying in the shade or  wearing long sleeves, sun glasses (UVA+UVB protection) and wide brim hats (4-inch brim around the entire circumference of the hat) are also recommended for sun protection.  - Call for new or changing lesions.  SKIN CANCER SCREENING PERFORMED TODAY.  History of Dysplastic Nevi - No evidence of recurrence today at right epigastric,  - Recommend regular full body skin exams - Recommend daily broad spectrum sunscreen SPF 30+ to sun-exposed areas, reapply every 2 hours as needed.  - Call if any new or changing lesions are noted between office visits  LENTIGO Exam:1 mm brown macule, left of midline distal dorsum nose supratip. see photo  Due to sun exposure Treatment Plan: Extraction attempted with no success.  Benign-appearing, observe. Recommend daily broad spectrum sunscreen SPF 30+ to sun-exposed areas, reapply every 2 hours as needed.  Call for any changes   Return in about 1 year (around 06/06/2024) for TBSE, Hx Dysplastic Nevi.  Anise Salvo, RMA, am acting as scribe for Armida Sans, MD .  Documentation: I have reviewed the above documentation for accuracy and completeness, and I agree with the above.  Armida Sans, MD

## 2023-06-07 NOTE — Patient Instructions (Addendum)
Melanoma ABCDEs  Melanoma is the most dangerous type of skin cancer, and is the leading cause of death from skin disease.  You are more likely to develop melanoma if you: Have light-colored skin, light-colored eyes, or red or blond hair Spend a lot of time in the sun Tan regularly, either outdoors or in a tanning bed Have had blistering sunburns, especially during childhood Have a close family member who has had a melanoma Have atypical moles or large birthmarks  Early detection of melanoma is key since treatment is typically straightforward and cure rates are extremely high if we catch it early.   The first sign of melanoma is often a change in a mole or a new dark spot.  The ABCDE system is a way of remembering the signs of melanoma.  A for asymmetry:  The two halves do not match. B for border:  The edges of the growth are irregular. C for color:  A mixture of colors are present instead of an even brown color. D for diameter:  Melanomas are usually (but not always) greater than 6mm - the size of a pencil eraser. E for evolution:  The spot keeps changing in size, shape, and color.  Please check your skin once per month between visits. You can use a small mirror in front and a large mirror behind you to keep an eye on the back side or your body.   If you see any new or changing lesions before your next follow-up, please call to schedule a visit.  Please continue daily skin protection including broad spectrum sunscreen SPF 30+ to sun-exposed areas, reapplying every 2 hours as needed when you're outdoors.    Due to recent changes in healthcare laws, you may see results of your pathology and/or laboratory studies on MyChart before the doctors have had a chance to review them. We understand that in some cases there may be results that are confusing or concerning to you. Please understand that not all results are received at the same time and often the doctors may need to interpret multiple  results in order to provide you with the best plan of care or course of treatment. Therefore, we ask that you please give us 2 business days to thoroughly review all your results before contacting the office for clarification. Should we see a critical lab result, you will be contacted sooner.   If You Need Anything After Your Visit  If you have any questions or concerns for your doctor, please call our main line at [phone] and press option 4 to reach your doctor's medical assistant. If no one answers, please leave a voicemail as directed and we will return your call as soon as possible. Messages left after 4 pm will be answered the following business day.   You may also send us a message via MyChart. We typically respond to MyChart messages within 1-2 business days.  For prescription refills, please ask your pharmacy to contact our office. Our fax number is [phone].  If you have an urgent issue when the clinic is closed that cannot wait until the next business day, you can page your doctor at the number below.    Please note that while we do our best to be available for urgent issues outside of office hours, we are not available 24/7.   If you have an urgent issue and are unable to reach us, you may choose to seek medical care at your doctor's office, retail clinic, urgent care   center, or emergency room.  If you have a medical emergency, please immediately call 911 or go to the emergency department.  Pager Numbers  - Dr. Kowalski: [phone]  - Dr. Moye: [phone]  - Dr. Stewart: [phone]  In the event of inclement weather, please call our main line at [phone] for an update on the status of any delays or closures.  Dermatology Medication Tips: Please keep the boxes that topical medications come in in order to help keep track of the instructions about where and how to use these. Pharmacies typically print the medication instructions only on the boxes and not  directly on the medication tubes.   If your medication is too expensive, please contact our office at [phone] option 4 or send us a message through MyChart.   We are unable to tell what your co-pay for medications will be in advance as this is different depending on your insurance coverage. However, we may be able to find a substitute medication at lower cost or fill out paperwork to get insurance to cover a needed medication.   If a prior authorization is required to get your medication covered by your insurance company, please allow us 1-2 business days to complete this process.  Drug prices often vary depending on where the prescription is filled and some pharmacies may offer cheaper prices.  The website www.goodrx.com contains coupons for medications through different pharmacies. The prices here do not account for what the cost may be with help from insurance (it may be cheaper with your insurance), but the website can give you the price if you did not use any insurance.  - You can print the associated coupon and take it with your prescription to the pharmacy.  - You may also stop by our office during regular business hours and pick up a GoodRx coupon card.  - If you need your prescription sent electronically to a different pharmacy, notify our office through [facility] MyChart or by phone at [phone] option 4.     

## 2023-06-10 ENCOUNTER — Encounter: Payer: Self-pay | Admitting: Dermatology

## 2023-06-13 DIAGNOSIS — M25551 Pain in right hip: Secondary | ICD-10-CM | POA: Diagnosis not present

## 2023-06-27 DIAGNOSIS — M1611 Unilateral primary osteoarthritis, right hip: Secondary | ICD-10-CM | POA: Diagnosis not present

## 2023-07-04 DIAGNOSIS — M25551 Pain in right hip: Secondary | ICD-10-CM | POA: Diagnosis not present

## 2023-08-03 DIAGNOSIS — M1611 Unilateral primary osteoarthritis, right hip: Secondary | ICD-10-CM | POA: Diagnosis not present

## 2023-08-10 ENCOUNTER — Telehealth: Payer: Self-pay | Admitting: Family Medicine

## 2023-08-10 ENCOUNTER — Other Ambulatory Visit: Payer: Self-pay | Admitting: Family Medicine

## 2023-08-10 DIAGNOSIS — K2270 Barrett's esophagus without dysplasia: Secondary | ICD-10-CM

## 2023-08-10 DIAGNOSIS — Z01818 Encounter for other preprocedural examination: Secondary | ICD-10-CM

## 2023-08-10 DIAGNOSIS — R7303 Prediabetes: Secondary | ICD-10-CM

## 2023-08-10 DIAGNOSIS — I10 Essential (primary) hypertension: Secondary | ICD-10-CM

## 2023-08-10 DIAGNOSIS — E782 Mixed hyperlipidemia: Secondary | ICD-10-CM

## 2023-08-10 DIAGNOSIS — Z125 Encounter for screening for malignant neoplasm of prostate: Secondary | ICD-10-CM

## 2023-08-10 NOTE — Telephone Encounter (Addendum)
LVM-Called pt to schedule an appt to with Provider (to get a EKG done) before signing off on Medical Clearance. (PEC schedule an appt with Merita Norton as soon as possible). Thank you

## 2023-08-15 ENCOUNTER — Encounter: Payer: Self-pay | Admitting: Family Medicine

## 2023-08-15 ENCOUNTER — Ambulatory Visit: Payer: Federal, State, Local not specified - PPO | Admitting: Family Medicine

## 2023-08-15 DIAGNOSIS — Z125 Encounter for screening for malignant neoplasm of prostate: Secondary | ICD-10-CM | POA: Diagnosis not present

## 2023-08-15 DIAGNOSIS — Z01818 Encounter for other preprocedural examination: Secondary | ICD-10-CM | POA: Diagnosis not present

## 2023-08-15 DIAGNOSIS — R7303 Prediabetes: Secondary | ICD-10-CM | POA: Diagnosis not present

## 2023-08-15 DIAGNOSIS — Z01810 Encounter for preprocedural cardiovascular examination: Secondary | ICD-10-CM | POA: Diagnosis not present

## 2023-08-15 DIAGNOSIS — K2270 Barrett's esophagus without dysplasia: Secondary | ICD-10-CM | POA: Diagnosis not present

## 2023-08-15 DIAGNOSIS — I10 Essential (primary) hypertension: Secondary | ICD-10-CM | POA: Diagnosis not present

## 2023-08-15 DIAGNOSIS — E782 Mixed hyperlipidemia: Secondary | ICD-10-CM | POA: Diagnosis not present

## 2023-08-15 NOTE — Progress Notes (Signed)
EKG completed for surgical clearance; EKG noted SB rhythm with HR of 56 beats per minute. No ectopic beats noted. No concerns for ST elevation or depression.  No comparison able to be made at this time.  Pt cleared for surgery following EKG; labs pending.  Jacky Kindle, FNP  [facility] [address] [place], [place] [address] [phone] (phone) [phone] (fax) [facility]

## 2023-08-16 LAB — CBC WITH DIFFERENTIAL/PLATELET
Basophils Absolute: 0.1 10*3/uL (ref 0.0–0.2)
Basos: 1 %
EOS (ABSOLUTE): 0.2 10*3/uL (ref 0.0–0.4)
Eos: 2 %
Hematocrit: 45.9 % (ref 37.5–51.0)
Hemoglobin: 15.9 g/dL (ref 13.0–17.7)
Immature Grans (Abs): 0 10*3/uL (ref 0.0–0.1)
Immature Granulocytes: 0 %
Lymphocytes Absolute: 2.3 10*3/uL (ref 0.7–3.1)
Lymphs: 30 %
MCH: 33.8 pg — ABNORMAL HIGH (ref 26.6–33.0)
MCHC: 34.6 g/dL (ref 31.5–35.7)
MCV: 98 fL — ABNORMAL HIGH (ref 79–97)
Monocytes Absolute: 0.7 10*3/uL (ref 0.1–0.9)
Monocytes: 9 %
Neutrophils Absolute: 4.5 10*3/uL (ref 1.4–7.0)
Neutrophils: 58 %
Platelets: 280 10*3/uL (ref 150–450)
RBC: 4.71 x10E6/uL (ref 4.14–5.80)
RDW: 13 % (ref 11.6–15.4)
WBC: 7.7 10*3/uL (ref 3.4–10.8)

## 2023-08-16 LAB — PSA: Prostate Specific Ag, Serum: 2.3 ng/mL (ref 0.0–4.0)

## 2023-08-16 LAB — COMPREHENSIVE METABOLIC PANEL
ALT: 63 IU/L — ABNORMAL HIGH (ref 0–44)
AST: 34 IU/L (ref 0–40)
Albumin: 4.6 g/dL (ref 3.9–4.9)
Alkaline Phosphatase: 57 IU/L (ref 44–121)
BUN/Creatinine Ratio: 15 (ref 10–24)
BUN: 16 mg/dL (ref 8–27)
Bilirubin Total: 0.4 mg/dL (ref 0.0–1.2)
CO2: 27 mmol/L (ref 20–29)
Calcium: 9.8 mg/dL (ref 8.6–10.2)
Chloride: 103 mmol/L (ref 96–106)
Creatinine, Ser: 1.04 mg/dL (ref 0.76–1.27)
Globulin, Total: 2.3 g/dL (ref 1.5–4.5)
Glucose: 96 mg/dL (ref 70–99)
Potassium: 4.3 mmol/L (ref 3.5–5.2)
Sodium: 142 mmol/L (ref 134–144)
Total Protein: 6.9 g/dL (ref 6.0–8.5)
eGFR: 81 mL/min/{1.73_m2} (ref >59)

## 2023-08-16 LAB — URINALYSIS, ROUTINE W REFLEX MICROSCOPIC
Bilirubin, UA: NEGATIVE
Glucose, UA: NEGATIVE
Ketones, UA: NEGATIVE
Leukocytes,UA: NEGATIVE
Nitrite, UA: NEGATIVE
Protein,UA: NEGATIVE
RBC, UA: NEGATIVE
Specific Gravity, UA: 1.01 (ref 1.005–1.030)
Urobilinogen, Ur: 0.2 mg/dL (ref 0.2–1.0)
pH, UA: 6 (ref 5.0–7.5)

## 2023-08-16 LAB — HEMOGLOBIN A1C
Est. average glucose Bld gHb Est-mCnc: 111 mg/dL
Hgb A1c MFr Bld: 5.5 % (ref 4.8–5.6)

## 2023-08-16 LAB — LIPID PANEL
Chol/HDL Ratio: 3.8 ratio (ref 0.0–5.0)
Cholesterol, Total: 155 mg/dL (ref 100–199)
HDL: 41 mg/dL (ref >39)
LDL Chol Calc (NIH): 96 mg/dL (ref 0–99)
Triglycerides: 95 mg/dL (ref 0–149)
VLDL Cholesterol Cal: 18 mg/dL (ref 5–40)

## 2023-08-16 LAB — TSH: TSH: 2.28 u[IU]/mL (ref 0.450–4.500)

## 2023-08-16 LAB — PROTIME-INR
INR: 1 (ref 0.9–1.2)
Prothrombin Time: 10.8 s (ref 9.1–12.0)

## 2023-08-16 LAB — APTT: aPTT: 27 s (ref 24–33)

## 2023-08-16 NOTE — Progress Notes (Signed)
Form ready for fax with EKG and lab print out.  Labs are normal and stable and pt is cleared for surgery.   Jacky Kindle, FNP  [facility] [address] [place], [place] [address] [phone] (phone) [phone] (fax) [facility]

## 2023-09-11 ENCOUNTER — Other Ambulatory Visit: Payer: Self-pay | Admitting: Family Medicine

## 2023-09-11 DIAGNOSIS — K120 Recurrent oral aphthae: Secondary | ICD-10-CM

## 2023-09-12 NOTE — Telephone Encounter (Signed)
Requested Prescriptions  Pending Prescriptions Disp Refills   valACYclovir (VALTREX) 1000 MG tablet [Pharmacy Med Name: VALACYCLOVIR 1 GM TAB] 90 tablet 1    Sig: TAKE ONE TABLET BY MOUTH ONE TIME DAILY     Antimicrobials:  Antiviral Agents - Anti-Herpetic Passed - 09/11/2023 12:54 PM      Passed - Valid encounter within last 12 months    Recent Outpatient Visits           4 weeks ago Encounter for pre-operative cardiovascular clearance   [facility] Jacky Kindle, FNP   4 months ago Ear fullness, right   [facility] Jacky Kindle, FNP   10 months ago Annual physical exam   [facility] Merita Norton T, FNP   11 months ago Moderate persistent asthma with exacerbation   [facility] Jacky Kindle, FNP   1 year ago COVID-19 virus infection   [facility] Petros, [place], PA-C       Future Appointments             In 8 months Deirdre Evener, MD [facility]

## 2023-09-20 DIAGNOSIS — E7800 Pure hypercholesterolemia, unspecified: Secondary | ICD-10-CM | POA: Diagnosis not present

## 2023-09-20 DIAGNOSIS — I10 Essential (primary) hypertension: Secondary | ICD-10-CM | POA: Diagnosis not present

## 2023-09-20 DIAGNOSIS — R002 Palpitations: Secondary | ICD-10-CM | POA: Diagnosis not present

## 2023-09-20 DIAGNOSIS — I700 Atherosclerosis of aorta: Secondary | ICD-10-CM | POA: Diagnosis not present

## 2023-10-03 ENCOUNTER — Telehealth: Payer: Self-pay | Admitting: Family Medicine

## 2023-10-03 NOTE — Telephone Encounter (Signed)
[facility] w/ Dr Alusio's office calling b/c anesthesia wants a copy of pt's last OV.  (They have the surgery clearance)  Fax: [phone]  (kelly direct fax)  Cb: [phone]   (kelly direct phone)

## 2023-10-09 DIAGNOSIS — M1611 Unilateral primary osteoarthritis, right hip: Secondary | ICD-10-CM | POA: Diagnosis not present

## 2023-10-09 DIAGNOSIS — M25751 Osteophyte, right hip: Secondary | ICD-10-CM | POA: Diagnosis not present

## 2023-11-20 DIAGNOSIS — Z5189 Encounter for other specified aftercare: Secondary | ICD-10-CM | POA: Diagnosis not present

## 2023-11-30 DIAGNOSIS — I700 Atherosclerosis of aorta: Secondary | ICD-10-CM | POA: Diagnosis not present

## 2023-11-30 DIAGNOSIS — Z96641 Presence of right artificial hip joint: Secondary | ICD-10-CM | POA: Diagnosis not present

## 2023-11-30 DIAGNOSIS — M5416 Radiculopathy, lumbar region: Secondary | ICD-10-CM | POA: Diagnosis not present

## 2023-11-30 DIAGNOSIS — M4727 Other spondylosis with radiculopathy, lumbosacral region: Secondary | ICD-10-CM | POA: Diagnosis not present

## 2023-11-30 DIAGNOSIS — M47816 Spondylosis without myelopathy or radiculopathy, lumbar region: Secondary | ICD-10-CM | POA: Diagnosis not present

## 2023-11-30 DIAGNOSIS — M79606 Pain in leg, unspecified: Secondary | ICD-10-CM | POA: Diagnosis not present

## 2024-03-03 ENCOUNTER — Telehealth: Payer: Self-pay | Admitting: Family Medicine

## 2024-03-03 NOTE — Telephone Encounter (Signed)
[facility] faxed refill request for the following medications:  valACYclovir (VALTREX) 1000 MG tablet   Please advise. 

## 2024-03-13 ENCOUNTER — Other Ambulatory Visit: Payer: Self-pay

## 2024-03-13 DIAGNOSIS — K120 Recurrent oral aphthae: Secondary | ICD-10-CM

## 2024-03-13 NOTE — Telephone Encounter (Signed)
 Received another request for valACYclovir (VALTREX) 1000 MG tablet.  I do not see that is was sent in or refused.  Please advise.

## 2024-03-13 NOTE — Telephone Encounter (Signed)
 Converted to RF Req and sent to [employer] nurse box

## 2024-03-13 NOTE — Telephone Encounter (Signed)
 Left voice message for patient to callback. Ok to advise patient that he needs an appointment to be seen before a refill can be sent in for him.

## 2024-03-17 ENCOUNTER — Other Ambulatory Visit: Payer: Self-pay

## 2024-03-17 DIAGNOSIS — K120 Recurrent oral aphthae: Secondary | ICD-10-CM

## 2024-03-17 MED ORDER — VALACYCLOVIR HCL 1 G PO TABS: 1000.0000 mg | ORAL_TABLET | Freq: Every day | ORAL | 0 refills | Status: DC

## 2024-04-21 ENCOUNTER — Encounter: Payer: Self-pay | Admitting: Family Medicine

## 2024-04-21 ENCOUNTER — Ambulatory Visit: Admitting: Family Medicine

## 2024-04-21 VITALS — BP 121/71 | HR 56 | Resp 16 | Ht 69.0 in | Wt 181.8 lb

## 2024-04-21 DIAGNOSIS — G8929 Other chronic pain: Secondary | ICD-10-CM

## 2024-04-21 DIAGNOSIS — I10 Essential (primary) hypertension: Secondary | ICD-10-CM | POA: Diagnosis not present

## 2024-04-21 DIAGNOSIS — K219 Gastro-esophageal reflux disease without esophagitis: Secondary | ICD-10-CM

## 2024-04-21 DIAGNOSIS — N182 Chronic kidney disease, stage 2 (mild): Secondary | ICD-10-CM | POA: Diagnosis not present

## 2024-04-21 DIAGNOSIS — E559 Vitamin D deficiency, unspecified: Secondary | ICD-10-CM | POA: Diagnosis not present

## 2024-04-21 DIAGNOSIS — Z789 Other specified health status: Secondary | ICD-10-CM | POA: Diagnosis not present

## 2024-04-21 DIAGNOSIS — Z125 Encounter for screening for malignant neoplasm of prostate: Secondary | ICD-10-CM

## 2024-04-21 DIAGNOSIS — E782 Mixed hyperlipidemia: Secondary | ICD-10-CM | POA: Diagnosis not present

## 2024-04-21 DIAGNOSIS — M5441 Lumbago with sciatica, right side: Secondary | ICD-10-CM

## 2024-04-21 DIAGNOSIS — Z8669 Personal history of other diseases of the nervous system and sense organs: Secondary | ICD-10-CM

## 2024-04-21 MED ORDER — MELOXICAM 7.5 MG PO TABS: 7.5000 mg | ORAL_TABLET | Freq: Every day | ORAL | 0 refills | Status: DC | PRN

## 2024-04-21 MED ORDER — METOPROLOL SUCCINATE ER 25 MG PO TB24: 25.0000 mg | ORAL_TABLET | Freq: Every day | ORAL | 3 refills | Status: AC

## 2024-04-21 MED ORDER — LOSARTAN POTASSIUM-HCTZ 100-25 MG PO TABS: 1.0000 | ORAL_TABLET | Freq: Every day | ORAL | 3 refills | Status: AC

## 2024-04-21 MED ORDER — ROSUVASTATIN CALCIUM 10 MG PO TABS: 10.0000 mg | ORAL_TABLET | Freq: Every day | ORAL | 3 refills | Status: AC

## 2024-04-21 MED ORDER — FAMOTIDINE 20 MG PO TABS: 20.0000 mg | ORAL_TABLET | Freq: Two times a day (BID) | ORAL | 1 refills | Status: DC | PRN

## 2024-04-21 MED ORDER — AMLODIPINE BESYLATE 5 MG PO TABS: 5.0000 mg | ORAL_TABLET | Freq: Every day | ORAL | 3 refills | Status: AC

## 2024-04-21 NOTE — Progress Notes (Signed)
 Established patient visit   Patient: Terry Franco   DOB: 1961/07/14   63 y.o. Male  MRN: [id number] Visit Date: 04/21/2024  Today's healthcare provider: Carlean Charter, DO   Chief Complaint  Patient presents with   Transitions Of Care    TOC/Est care no other concerns   Subjective    HPI Terry Franco is a 63 year old male who presents for medication refills and management of chronic conditions.  He has a history of gastroesophageal reflux disease (GERD) and manages his symptoms with Tums as needed, approximately once every couple of weeks. He previously used pantoprazole  to gain control of reflux but stopped after that he should not be used for more than 3 months. He experiences postnasal drip and allergies, for which he takes Claritin and Nasacort, though these do not completely alleviate his symptoms.  He has chronic back pain and muscle tightness, managed with muscle relaxers for ten years, initially at 750 mg daily, now reduced to 500 mg and not taken daily due to his preference to reduce dependence on medications. He has been exploring stretching therapies for the past five weeks to help with muscle tightness. He has a history of a bulging disc at L4-5, causing right-sided low back pain that radiates down to his right foot, suggesting possible sciatic involvement. He also experiences pain in the SI joint and has received injections in the past. 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  He did not attend physical therapy for recovery, instead managing his physical exercises at home.  His hypertension is managed with losartan-hydrochlorothiazide, amlodipine  and metoprolol . He also takes rosuvastatin  for cholesterol management. He has experienced elevated blood pressure in the past, particularly when taking meloxicam regularly, which he now uses only as-needed for pain relief, after his  cardiologist recommended against the daily use.  He has a history of low vitamin D , for which he takes vitamin D3 supplements. He also takes CoQ10 daily to manage muscle cramps associated with rosuvastatin  use. He practices intermittent fasting, typically eating between 10:30 AM and 7 PM, and has been working on [employer] management post-surgery.  He reports a family history of diabetes and melanoma, with his mother having died from melanoma. He gets his skin checked annually by a dermatologist and has had severe dysplastic nevi in the past. He also has a history of pneumonia in 2017, which has led to increased susceptibility to respiratory infections.  He has received vaccinations for COVID-19, flu, and pneumonia. He occasionally monitors his blood pressure at home.      Medications: Outpatient Medications Prior to Visit  Medication Sig Note   Coenzyme Q10 (CO Q-10) 100 MG CAPS Take 200 mg by mouth daily.    Fluocinolone  Acetonide Body 0.01 % OIL Apply to damp skin after showering QD PRN.    loratadine (CLARITIN) 10 MG tablet Take 10 mg by mouth daily.    meloxicam (MOBIC) 15 MG tablet Take 1 tablet by mouth daily.    methocarbamol (ROBAXIN) 500 MG tablet Take 500 mg by mouth in the morning and at bedtime.    triamcinolone  (NASACORT) 55 MCG/ACT AERO nasal inhaler Place 2 sprays into the nose daily.    valACYclovir  (VALTREX ) 1000 MG tablet Take 1 tablet (1,000 mg total) by mouth daily.    [DISCONTINUED] amLODipine  (NORVASC ) 5 MG tablet Take 5 mg by mouth daily.    [DISCONTINUED] co-enzyme  Q-10 30 MG capsule Take 1 capsule (30 mg total) by mouth 3 (three) times daily.    [DISCONTINUED] losartan-hydrochlorothiazide (HYZAAR) 100-25 MG per tablet Take 1 tablet by mouth daily.     [DISCONTINUED] methocarbamol (ROBAXIN) 750 MG tablet Take 750 mg by mouth 3 (three) times daily. 04/21/2024: decreased to 500 mg   [DISCONTINUED] metoprolol  succinate (TOPROL -XL) 25 MG 24 hr tablet TAKE 1 TABLET(25 MG) BY  MOUTH DAILY    [DISCONTINUED] montelukast  (SINGULAIR ) 10 MG tablet Take 1 tablet (10 mg total) by mouth at bedtime.    [DISCONTINUED] pantoprazole  (PROTONIX ) 40 MG tablet Take 1 tablet (40 mg total) by mouth daily.    [DISCONTINUED] rosuvastatin  (CRESTOR ) 10 MG tablet Take 1 tablet (10 mg total) by mouth daily.    No facility-administered medications prior to visit.    Review of Systems  Respiratory: Negative.  Negative for cough, shortness of breath and wheezing.   Cardiovascular:  Negative for chest pain, palpitations and leg swelling.  Neurological:  Negative for dizziness, weakness and headaches.        Objective    BP 121/71 (BP Location: Left Arm, Patient Position: Sitting, Cuff Size: Normal)   Pulse (!) 56   Resp 16   Ht 5\' 9"  (1.753 m)   Wt 181 lb 12.8 oz (82.5 kg)   SpO2 98%   BMI 26.85 kg/m     Physical Exam Vitals and nursing note reviewed.  Constitutional:      General: He is not in acute distress.    Appearance: Normal appearance.  HENT:     Head: Normocephalic and atraumatic.  Eyes:     General: No scleral icterus.    Conjunctiva/sclera: Conjunctivae normal.  Cardiovascular:     Rate and Rhythm: Normal rate.  Pulmonary:     Effort: Pulmonary effort is normal.  Neurological:     Mental Status: He is alert and oriented to person, place, and time. Mental status is at baseline.  Psychiatric:        Mood and Affect: Mood normal.        Behavior: Behavior normal.      No results found for any visits on 04/21/24.  Assessment & Plan    Primary hypertension -     Metoprolol  Succinate ER; Take 1 tablet (25 mg total) by mouth daily.  Dispense: 90 tablet; Refill: 3 -     amLODIPine  Besylate; Take 1 tablet (5 mg total) by mouth daily.  Dispense: 90 tablet; Refill: 3 -     Losartan Potassium-HCTZ; Take 1 tablet by mouth daily.  Dispense: 90 tablet; Refill: 3 -     Comprehensive metabolic panel with GFR  Transition of care  Mixed hyperlipidemia -      Rosuvastatin  Calcium ; Take 1 tablet (10 mg total) by mouth daily.  Dispense: 90 tablet; Refill: 3 -     Lipid panel  Gastroesophageal reflux disease, unspecified whether esophagitis present -     Famotidine; Take 1 tablet (20 mg total) by mouth 2 (two) times daily as needed for heartburn or indigestion.  Dispense: 90 tablet; Refill: 1  Hx of migraine headaches -     Metoprolol  Succinate ER; Take 1 tablet (25 mg total) by mouth daily.  Dispense: 90 tablet; Refill: 3  Hypercholesterolemia with hypertriglyceridemia -     Rosuvastatin  Calcium ; Take 1 tablet (10 mg total) by mouth daily.  Dispense: 90 tablet; Refill: 3  Prostate cancer screening -     PSA Total (Reflex To Free)  Chronic kidney disease, stage 2, mildly decreased GFR -     Microalbumin / creatinine urine ratio  Vitamin D  deficiency -     VITAMIN D  25 Hydroxy (Vit-D Deficiency, Fractures)  Chronic right-sided low back pain with right-sided sciatica -     Meloxicam; Take 1 tablet (7.5 mg total) by mouth daily as needed for pain. in place of meloxicam 15 mg (do not take both in same 24 hour period)  Dispense: 90 tablet; Refill: 0      Hypertension Hypertension managed with losartan-hydrochlorothiazide, amlodipine , and metoprolol . Recent elevation possibly due to meloxicam use. Not regularly checking blood pressure at home. - Send prescriptions for losartan, hydrochlorothiazide, amlodipine , and metoprolol  to pharmacy. - Encourage regular home blood pressure monitoring.  Hyperlipidemia Chronic, stable.  Continue rosuvastatin  10 mg daily.  Recheck lipid panel today. Continue co-Q10 to prevent previous muscle discomfort associated with statin use.  Chronic Kidney Disease, Stage 2 Chronic kidney disease stage 2 with GFR between 60 and 90. Blood pressure and cholesterol control essential for kidney protection.  Continue limited meloxicam use due to potential kidney impact. - Order metabolic panel to assess kidney function. -  Encourage blood pressure control to protect kidney function.  Chronic right-sided low back pain with right-sided sciatica Chronic sciatica with pain radiating from lower back to foot, likely due to bulging disc at L4-5. Pain management challenging, affects sleep and daily activities. - Encourage stretching exercises and consider physical therapy to build strength and balance muscles if needed. - Gave patient handout of exercises he can do to strengthen his torso to help alleviate and prevent his pain.  Discussed several exercises that are particularly good for this. - Discuss potential use of lidocaine  patches for pain management; advised these are available over-the-counter.  Chronic Pain Chronic pain related to sciatica and musculoskeletal issues. Meloxicam provides relief but used sparingly due to blood pressure concerns. Exploring stretching and strengthening exercises to reduce medication reliance. - Prescribe meloxicam 7.5 mg to try a lower dose for pain management. - Encourage stretching and strengthening exercises. - Consider physical therapy if stretching does not provide sufficient relief.  GERD GERD managed with Tums as needed. Occasional severe reflux episodes. Interested in generic option for severe episodes. - Prescribe famotidine as needed for severe reflux episodes.  Allergic Rhinitis Chronic allergic rhinitis with postnasal drip. Managed with Claritin and Nasacort. Considering allergy shots if symptoms worsen. - Continue Claritin and Nasacort for allergy management. - Discuss potential for allergy shots if symptoms become more bothersome.  Vitamin D  Deficiency Vitamin D  deficiency managed with vitamin D3 supplementation. Recent blood work showed mildly low level. - Order vitamin D  level with current blood work.  History of Pneumonia Pneumonia in 2017 with subsequent respiratory issues. Vaccinated against pneumonia and COVID-19. Due for updated pneumonia vaccine. -  Recommend getting Prevnar 20 or 21 vaccine in 1 week as he will be due for an updated pneumonia vaccine.  Transition of care Up to date with COVID-19 and flu vaccinations. Discussed need for updated pneumonia vaccination. Practices intermittent fasting and weight management. Regular dermatology checks due to family history of melanoma. - Recommend getting Prevnar 20 or 21 vaccine here or at the pharmacy in 1 week or later. - Encourage continuation of healthy lifestyle practices, including intermittent fasting and weight management. - Continue regular dermatology checks.    Follow-up Follow-up needed to monitor blood pressure, kidney function, and overall health status. - Schedule follow-up appointment in 3-6 months for a full physical exam and  review of current health status.  Return in about 6 months (around 10/22/2024) for CPE.      I discussed the assessment and treatment plan with the patient  The patient was provided an opportunity to ask questions and all were answered. The patient agreed with the plan and demonstrated an understanding of the instructions.   The patient was advised to call back or seek an in-person evaluation if the symptoms worsen or if the condition fails to improve as anticipated.  Total time was 40 minutes. That includes chart review before the visit, the actual patient visit, and time spent on documentation after the visit.    Carlean Charter, DO  [facility] [phone] (phone) [phone] (fax)  [facility]

## 2024-04-21 NOTE — Patient Instructions (Signed)
 Recommend update pneumonia vaccine with either Prevnar-20 or Prevnar-21.

## 2024-04-22 LAB — PSA TOTAL (REFLEX TO FREE): Prostate Specific Ag, Serum: 2.4 ng/mL (ref 0.0–4.0)

## 2024-04-22 LAB — COMPREHENSIVE METABOLIC PANEL WITH GFR
ALT: 40 IU/L (ref 0–44)
AST: 28 IU/L (ref 0–40)
Albumin: 4.9 g/dL (ref 3.9–4.9)
Alkaline Phosphatase: 64 IU/L (ref 44–121)
BUN/Creatinine Ratio: 14 (ref 10–24)
BUN: 14 mg/dL (ref 8–27)
Bilirubin Total: 0.6 mg/dL (ref 0.0–1.2)
CO2: 23 mmol/L (ref 20–29)
Calcium: 9.6 mg/dL (ref 8.6–10.2)
Chloride: 103 mmol/L (ref 96–106)
Creatinine, Ser: 1.01 mg/dL (ref 0.76–1.27)
Globulin, Total: 2.1 g/dL (ref 1.5–4.5)
Glucose: 100 mg/dL — ABNORMAL HIGH (ref 70–99)
Potassium: 4.2 mmol/L (ref 3.5–5.2)
Sodium: 142 mmol/L (ref 134–144)
Total Protein: 7 g/dL (ref 6.0–8.5)
eGFR: 83 mL/min/{1.73_m2} (ref >59)

## 2024-04-22 LAB — LIPID PANEL
Chol/HDL Ratio: 4.3 ratio (ref 0.0–5.0)
Cholesterol, Total: 162 mg/dL (ref 100–199)
HDL: 38 mg/dL — ABNORMAL LOW (ref >39)
LDL Chol Calc (NIH): 99 mg/dL (ref 0–99)
Triglycerides: 142 mg/dL (ref 0–149)
VLDL Cholesterol Cal: 25 mg/dL (ref 5–40)

## 2024-04-22 LAB — MICROALBUMIN / CREATININE URINE RATIO
Creatinine, Urine: 93.2 mg/dL
Microalb/Creat Ratio: 11 mg/g{creat} (ref 0–29)
Microalbumin, Urine: 10.6 ug/mL

## 2024-04-22 LAB — VITAMIN D 25 HYDROXY (VIT D DEFICIENCY, FRACTURES): Vit D, 25-Hydroxy: 55.1 ng/mL (ref 30.0–100.0)

## 2024-05-05 ENCOUNTER — Ambulatory Visit: Payer: Self-pay | Admitting: Family Medicine

## 2024-05-19 ENCOUNTER — Encounter: Payer: Self-pay | Admitting: Family Medicine

## 2024-05-19 ENCOUNTER — Encounter: Admitting: Family Medicine

## 2024-05-19 DIAGNOSIS — J4541 Moderate persistent asthma with (acute) exacerbation: Secondary | ICD-10-CM

## 2024-05-21 MED ORDER — ALBUTEROL SULFATE HFA 108 (90 BASE) MCG/ACT IN AERS: 2.0000 | INHALATION_SPRAY | Freq: Four times a day (QID) | RESPIRATORY_TRACT | 6 refills | Status: AC | PRN

## 2024-05-21 MED ORDER — BUDESONIDE-FORMOTEROL FUMARATE 80-4.5 MCG/ACT IN AERO: 2.0000 | INHALATION_SPRAY | Freq: Two times a day (BID) | RESPIRATORY_TRACT | 12 refills | Status: AC

## 2024-06-05 ENCOUNTER — Ambulatory Visit: Payer: Federal, State, Local not specified - PPO | Admitting: Dermatology

## 2024-07-07 ENCOUNTER — Ambulatory Visit: Admitting: Dermatology

## 2024-07-07 DIAGNOSIS — Z7189 Other specified counseling: Secondary | ICD-10-CM

## 2024-07-07 DIAGNOSIS — Z86018 Personal history of other benign neoplasm: Secondary | ICD-10-CM

## 2024-07-07 DIAGNOSIS — L578 Other skin changes due to chronic exposure to nonionizing radiation: Secondary | ICD-10-CM

## 2024-07-07 DIAGNOSIS — L814 Other melanin hyperpigmentation: Secondary | ICD-10-CM | POA: Diagnosis not present

## 2024-07-07 DIAGNOSIS — D2372 Other benign neoplasm of skin of left lower limb, including hip: Secondary | ICD-10-CM

## 2024-07-07 DIAGNOSIS — D239 Other benign neoplasm of skin, unspecified: Secondary | ICD-10-CM

## 2024-07-07 DIAGNOSIS — D229 Melanocytic nevi, unspecified: Secondary | ICD-10-CM

## 2024-07-07 DIAGNOSIS — D1801 Hemangioma of skin and subcutaneous tissue: Secondary | ICD-10-CM

## 2024-07-07 DIAGNOSIS — Z1283 Encounter for screening for malignant neoplasm of skin: Secondary | ICD-10-CM | POA: Diagnosis not present

## 2024-07-07 DIAGNOSIS — W908XXA Exposure to other nonionizing radiation, initial encounter: Secondary | ICD-10-CM

## 2024-07-07 DIAGNOSIS — L821 Other seborrheic keratosis: Secondary | ICD-10-CM

## 2024-07-07 NOTE — Progress Notes (Unsigned)
   Follow-Up Visit   Subjective  Krew Ledger is a 63 y.o. male who presents for the following: Skin Cancer Screening and Full Body Skin Exam  The patient presents for Total-Body Skin Exam (TBSE) for skin cancer screening and mole check. The patient has spots, moles and lesions to be evaluated, some may be new or changing and the patient may have concern these could be cancer.  The following portions of the chart were reviewed this encounter and updated as appropriate: medications, allergies, medical history  Review of Systems:  No other skin or systemic complaints except as noted in HPI or Assessment and Plan.  Objective  Well appearing patient in no apparent distress; mood and affect are within normal limits.  A full examination was performed including scalp, head, eyes, ears, nose, lips, neck, chest, axillae, abdomen, back, buttocks, bilateral upper extremities, bilateral lower extremities, hands, feet, fingers, toes, fingernails, and toenails. All findings within normal limits unless otherwise noted below.   Relevant physical exam findings are noted in the Assessment and Plan.        Assessment & Plan   SKIN CANCER SCREENING PERFORMED TODAY.  ACTINIC DAMAGE - Chronic condition, secondary to cumulative UV/sun exposure - diffuse scaly erythematous macules with underlying dyspigmentation - Recommend daily broad spectrum sunscreen SPF 30+ to sun-exposed areas, reapply every 2 hours as needed.  - Staying in the shade or wearing long sleeves, sun glasses (UVA+UVB protection) and wide brim hats (4-inch brim around the entire circumference of the hat) are also recommended for sun protection.  - Call for new or changing lesions.  LENTIGINES, SEBORRHEIC KERATOSES, HEMANGIOMAS - Benign normal skin lesions - Benign-appearing - Call for any changes  MELANOCYTIC NEVI - Tan-brown and/or pink-flesh-colored symmetric macules and papules - Benign appearing on exam today -  Observation - Call clinic for new or changing moles - Recommend daily use of broad spectrum spf 30+ sunscreen to sun-exposed areas.   DERMATOFIBROMA Exam: Firm pink/brown papulenodule with dimple sign left thigh and left lower leg  See photo  Treatment Plan: A dermatofibroma is a benign growth possibly related to trauma, such as an insect bite, cut from shaving, or inflamed acne-type bump.  Treatment options to remove include shave or excision with resulting scar and risk of recurrence.  Since benign-appearing and not bothersome, will observe for now.    LENTIGO Exam:1 mm brown macule, left of midline distal dorsum nose supratip. see photo - no change from last year. Benign-appearing.  Observation.  Call clinic for new or changing lesions.  Recommend daily use of broad spectrum spf 30+ sunscreen to sun-exposed areas.    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  Call if any new or changing lesions are noted between office visits     Return in about 1 year (around 07/07/2025) for TBSE, hx of Dysplastic nevus .  IFay Kirks, CMA, am acting as scribe for Alm Rhyme, MD .   Documentation: I have reviewed the above documentation for accuracy and completeness, and I agree with the above.  Alm Rhyme, MD

## 2024-07-07 NOTE — Patient Instructions (Signed)

## 2024-07-08 ENCOUNTER — Encounter: Payer: Self-pay | Admitting: Dermatology

## 2024-07-26 ENCOUNTER — Other Ambulatory Visit: Payer: Self-pay | Admitting: Family Medicine

## 2024-07-26 ENCOUNTER — Encounter: Payer: Self-pay | Admitting: Family Medicine

## 2024-07-26 DIAGNOSIS — K120 Recurrent oral aphthae: Secondary | ICD-10-CM

## 2024-08-09 ENCOUNTER — Encounter: Payer: Self-pay | Admitting: Family Medicine

## 2024-08-12 ENCOUNTER — Other Ambulatory Visit: Payer: Self-pay

## 2024-08-13 ENCOUNTER — Other Ambulatory Visit: Payer: Self-pay

## 2024-08-13 DIAGNOSIS — G8929 Other chronic pain: Secondary | ICD-10-CM

## 2024-08-13 DIAGNOSIS — K120 Recurrent oral aphthae: Secondary | ICD-10-CM

## 2024-08-13 MED ORDER — MELOXICAM 7.5 MG PO TABS: 7.5000 mg | ORAL_TABLET | Freq: Every day | ORAL | 3 refills | Status: AC | PRN

## 2024-08-13 NOTE — Telephone Encounter (Signed)
 LOV 04/21/24 NOV 10/22/24 LRF 04/21/24 90 x 0

## 2024-08-21 ENCOUNTER — Ambulatory Visit: Admitting: Family Medicine

## 2024-08-21 ENCOUNTER — Encounter: Payer: Self-pay | Admitting: Family Medicine

## 2024-08-21 VITALS — BP 147/94 | HR 61 | Temp 98.5°F | Ht 69.0 in | Wt 176.0 lb

## 2024-08-21 DIAGNOSIS — K219 Gastro-esophageal reflux disease without esophagitis: Secondary | ICD-10-CM | POA: Diagnosis not present

## 2024-08-21 DIAGNOSIS — I10 Essential (primary) hypertension: Secondary | ICD-10-CM

## 2024-08-21 DIAGNOSIS — J4541 Moderate persistent asthma with (acute) exacerbation: Secondary | ICD-10-CM

## 2024-08-21 DIAGNOSIS — J301 Allergic rhinitis due to pollen: Secondary | ICD-10-CM | POA: Diagnosis not present

## 2024-08-21 MED ORDER — PREDNISONE 20 MG PO TABS: 40.0000 mg | ORAL_TABLET | Freq: Every day | ORAL | 0 refills | Status: AC

## 2024-08-21 MED ORDER — GUAIFENESIN-CODEINE 100-10 MG/5ML PO SOLN: 10.0000 mL | Freq: Three times a day (TID) | ORAL | 0 refills | Status: DC | PRN

## 2024-08-21 NOTE — Progress Notes (Signed)
 Acute visit   Patient: Terry Franco   DOB: 1961/04/18   63 y.o. Male  MRN: [id number] PCP: Donzella Lauraine SAILOR, DO   Chief Complaint  Patient presents with   Acute Visit    Been coughing for 6 weeks beginning in August was exposed to raw meat, that was spoiled.  Not sure if it is related to his cough.  States he does have allergies, discomfort in chest feels like airway is inflamed.  Patient reports that he has sinus drainage, congestion and pressure.  When blowing nose it is clear at one time it was yellow or green.  Have take several covid tests and flu tests at home all negative.   Subjective    Discussed the use of AI scribe software for clinical note transcription with the patient, who gave verbal consent to proceed.  History of Present Illness   Terry Franco is a 63 year old male with asthma who presents with a persistent cough.  He has experienced a persistent cough for six weeks, beginning after mold exposure while cleaning a freezer. Initial symptoms included sneezing, chills, and fever, with negative COVID-19 and flu tests. The cough leads to violent fits lasting up to fifteen minutes, causing gasping for air and significant fatigue. Physical activity exacerbates the cough.  He has asthma and a pattern of lingering coughs post-colds since pneumonia in 2017. He uses a steroid inhaler and albuterol  as a rescue inhaler, with no relief from over-the-counter medications like DayQuil and Sudafed.  He grew up in a heavy smoking household and has a family history of lung cancer. He manages GERD with Pepcid  and experiences chest and throat discomfort, particularly when swallowing liquids. Significant sinus drainage leads to choking sensations. Frequent use of cough drops may irritate his esophagus. His blood pressure is elevated, possibly due to cough and cold medications.        Review of Systems  Objective    BP (!) 147/94 (BP Location: Left Arm, Patient Position:  Sitting)   Pulse 61   Temp 98.5 F (36.9 C) (Oral)   Ht 5' 9 (1.753 m)   Wt 176 lb (79.8 kg)   BMI 25.99 kg/m  Physical Exam Vitals reviewed.  Constitutional:      General: He is not in acute distress.    Appearance: Normal appearance. He is not diaphoretic.  HENT:     Head: Normocephalic and atraumatic.  Eyes:     General: No scleral icterus.    Conjunctiva/sclera: Conjunctivae normal.  Cardiovascular:     Rate and Rhythm: Normal rate and regular rhythm.     Heart sounds: Normal heart sounds. No murmur heard. Pulmonary:     Effort: Pulmonary effort is normal. No respiratory distress.     Breath sounds: Wheezing present. No rhonchi.  Abdominal:     General: There is no distension.     Palpations: Abdomen is soft.     Tenderness: There is no abdominal tenderness.  Musculoskeletal:     Cervical back: Neck supple.     Right lower leg: No edema.     Left lower leg: No edema.  Lymphadenopathy:     Cervical: No cervical adenopathy.  Skin:    General: Skin is warm and dry.     Findings: No rash.  Neurological:     Mental Status: He is alert and oriented to person, place, and time. Mental status is at baseline.  Psychiatric:        Mood  and Affect: Mood normal.        Behavior: Behavior normal.      No results found for any visits on 08/21/24.  Assessment & Plan     Problem List Items Addressed This Visit       Cardiovascular and Mediastinum   BP (high blood pressure)     Respiratory   Allergic rhinitis   Moderate persistent asthma with exacerbation - Primary   Relevant Medications   predniSONE  (DELTASONE ) 20 MG tablet     Digestive   GERD (gastroesophageal reflux disease)        Asthma with acute exacerbation Asthma exacerbation likely triggered by recent viral illness and allergen exposure. Wheezing indicates inflammation. Secondhand smoke exposure noted. No pneumonia on lung examination. Symptoms include violent coughing fits, chest discomfort, and  fatigue. Inhalers and over-the-counter medications ineffective. - Prescribe prednisone  40 mg daily for 7 days, take with breakfast to protect stomach and avoid GERD exacerbation. - Prescribe codeine  cough syrup with Mucinex  for nighttime use to alleviate cough and aid sleep. - Continue budesonide /formoterol  inhaler as maintenance therapy. - Use albuterol  inhaler as needed for acute symptoms. - Advise against using Sudafed as it may not be beneficial for current symptoms. - Recommend honey-containing cough drops to minimize GERD irritation.  Allergic rhinitis Allergic rhinitis contributing to postnasal drip and potential exacerbation of asthma symptoms. Symptoms include sinus drainage and postnasal drip. Allergies and asthma are interrelated, with flare-ups occurring simultaneously. - Consider using steroid nasal spray for allergy management and to reduce postnasal drip.  Gastroesophageal reflux disease (GERD) Longstanding GERD with current symptoms of esophageal irritation, possibly exacerbated by frequent use of cough drops and medications. Reports irritation when swallowing liquids. - Advise taking prednisone  with food to minimize GERD symptoms. - Consider using honey-containing cough drops to minimize GERD irritation.  Hypertension Elevated blood pressure likely secondary to use of cough and cold medications, including Sudafed. Prednisone  may also temporarily elevate blood pressure. Blood pressure expected to normalize after discontinuation of current medications and completion of prednisone  course. - Monitor blood pressure, noting that it may normalize after discontinuation of current medications and completion of prednisone  course.       Meds ordered this encounter  Medications   predniSONE  (DELTASONE ) 20 MG tablet    Sig: Take 2 tablets (40 mg total) by mouth daily with breakfast for 7 days.    Dispense:  14 tablet    Refill:  0   guaiFENesin -codeine  100-10 MG/5ML syrup    Sig:  Take 10 mLs by mouth 3 (three) times daily as needed for cough.    Dispense:  120 mL    Refill:  0     Return if symptoms worsen or fail to improve.      Jon Eva, MD  [facility] [phone] (phone) [phone] (fax)  [facility]

## 2024-08-30 ENCOUNTER — Encounter: Payer: Self-pay | Admitting: Family Medicine

## 2024-08-30 DIAGNOSIS — K219 Gastro-esophageal reflux disease without esophagitis: Secondary | ICD-10-CM

## 2024-09-01 MED ORDER — FAMOTIDINE 20 MG PO TABS: 20.0000 mg | ORAL_TABLET | Freq: Two times a day (BID) | ORAL | 2 refills | Status: AC | PRN

## 2024-09-01 NOTE — Telephone Encounter (Signed)
 Rx sent to pharmacy

## 2024-09-18 DIAGNOSIS — I10 Essential (primary) hypertension: Secondary | ICD-10-CM | POA: Diagnosis not present

## 2024-09-18 DIAGNOSIS — I700 Atherosclerosis of aorta: Secondary | ICD-10-CM | POA: Diagnosis not present

## 2024-09-18 DIAGNOSIS — R002 Palpitations: Secondary | ICD-10-CM | POA: Diagnosis not present

## 2024-09-18 DIAGNOSIS — E7800 Pure hypercholesterolemia, unspecified: Secondary | ICD-10-CM | POA: Diagnosis not present

## 2024-09-19 DIAGNOSIS — S76011A Strain of muscle, fascia and tendon of right hip, initial encounter: Secondary | ICD-10-CM | POA: Diagnosis not present

## 2024-09-19 DIAGNOSIS — Z96641 Presence of right artificial hip joint: Secondary | ICD-10-CM | POA: Diagnosis not present

## 2024-10-22 ENCOUNTER — Ambulatory Visit: Admitting: Family Medicine

## 2024-10-22 ENCOUNTER — Encounter: Payer: Self-pay | Admitting: Family Medicine

## 2024-10-22 VITALS — BP 135/79 | HR 55 | Temp 97.9°F | Ht 69.0 in | Wt 176.0 lb

## 2024-10-22 DIAGNOSIS — M5441 Lumbago with sciatica, right side: Secondary | ICD-10-CM

## 2024-10-22 DIAGNOSIS — N182 Chronic kidney disease, stage 2 (mild): Secondary | ICD-10-CM

## 2024-10-22 DIAGNOSIS — I10 Essential (primary) hypertension: Secondary | ICD-10-CM | POA: Diagnosis not present

## 2024-10-22 DIAGNOSIS — J302 Other seasonal allergic rhinitis: Secondary | ICD-10-CM | POA: Diagnosis not present

## 2024-10-22 DIAGNOSIS — R7303 Prediabetes: Secondary | ICD-10-CM | POA: Diagnosis not present

## 2024-10-22 DIAGNOSIS — Z0000 Encounter for general adult medical examination without abnormal findings: Secondary | ICD-10-CM

## 2024-10-22 DIAGNOSIS — G8929 Other chronic pain: Secondary | ICD-10-CM

## 2024-10-22 DIAGNOSIS — E782 Mixed hyperlipidemia: Secondary | ICD-10-CM

## 2024-10-22 DIAGNOSIS — J3089 Other allergic rhinitis: Secondary | ICD-10-CM

## 2024-10-22 DIAGNOSIS — Z79899 Other long term (current) drug therapy: Secondary | ICD-10-CM

## 2024-10-22 DIAGNOSIS — K2100 Gastro-esophageal reflux disease with esophagitis, without bleeding: Secondary | ICD-10-CM

## 2024-10-22 DIAGNOSIS — R1013 Epigastric pain: Secondary | ICD-10-CM

## 2024-10-22 MED ORDER — PANTOPRAZOLE SODIUM 40 MG PO TBEC: 40.0000 mg | DELAYED_RELEASE_TABLET | Freq: Every day | ORAL | 0 refills | Status: AC

## 2024-10-22 NOTE — Assessment & Plan Note (Signed)
 Pt reports evaluated by cardiology and cleared. Chronic GERD. Patient trying to avoid famotidine  use.

## 2024-10-22 NOTE — Progress Notes (Signed)
 Complete physical exam   Patient: Terry Franco   DOB: 1961-06-06   63 y.o. Male  MRN: [id number] Visit Date: 10/22/2024  Today's healthcare provider: LAURAINE LOISE BUOY, DO   Chief Complaint  Patient presents with   Annual Exam    Diet- Fast til about 11 stops eating by 7 pm, no soda or bread, watches what he eats. Exercise- Goes to the gym 3 times a week and plays golf 2 times a week Feeling overall- Pretty decent Sleep- Okay, pain sciatic seems better now Concerns- Having acid reflux issues, having some depression issues    Subjective    Terry Franco is a 63 y.o. male who presents today for a complete physical exam.   HPI HPI     Annual Exam    Additional comments: Diet- Fast til about 11 stops eating by 7 pm, no soda or bread, watches what he eats. Exercise- Goes to the gym 3 times a week and plays golf 2 times a week Feeling overall- Pretty decent Sleep- Okay, pain sciatic seems better now Concerns- Having acid reflux issues, having some depression issues       Last edited by Terrel Powell CROME, CMA on 10/22/2024  9:38 AM.       Terry Franco is a 63 year old male with GERD and allergies who presents for a physical exam and management of reflux and allergy symptoms.  He has ongoing gastroesophageal reflux disease (GERD) with esophageal discomfort described as 'sandpaper', accompanied by coughing and breathing difficulties. He resumed pantoprazole  three weeks ago, which has improved his symptoms. He currently takes famotidine  twice daily with some improvement. Previous endoscopies showed esophageal redness, but biopsies were negative for Barrett's esophagus.  He has a history of allergies and has tried medications including Claritin, Zyrtec, Xyzal, Allegra, and Singulair , with marginal effectiveness. He is allergic to [place] grass, which exacerbates symptoms during golf and when cutting grass at home. He has seen an allergist but is hesitant about allergy  shots due to the long-term commitment.  He experiences sciatica with pain radiating to his calf. He has increased his methocarbamol dose to 750 mg and takes meloxicam  7.5 mg, which he finds effective without affecting his blood pressure.  He engages in regular physical activity, attending the gym three times a week and golfing twice a week. He has tight hamstrings and a hip flexor issue post-hip replacement surgery, which he manages with stretching exercises. No chest pain or shortness of breath, but some discomfort.    Past Medical History:  Diagnosis Date   Allergy    Anxiety    Arthritis    Asthma    Atypical mole 02/17/2020   R epigastric/excision   Depression    Dysplastic nevus 05/26/2021   left prox tricep at the axillary fold - severe - margins free but close, recheck 12/2021   GERD (gastroesophageal reflux disease)    Herpes simplex infection    type 1   Hyperlipidemia    Hypertension    Past Surgical History:  Procedure Laterality Date   COLONOSCOPY WITH PROPOFOL      COLONOSCOPY WITH PROPOFOL  N/A 10/13/2021   Procedure: COLONOSCOPY WITH PROPOFOL ;  Surgeon: Janalyn Keene NOVAK, MD;  Location: [facility] ENDOSCOPY;  Service: Endoscopy;  Laterality: N/A;   COLONOSCOPY WITH PROPOFOL  N/A 10/10/2022   Procedure: COLONOSCOPY WITH PROPOFOL ;  Surgeon: Jinny Carmine, MD;  Location: [facility] ENDOSCOPY;  Service: Endoscopy;  Laterality: N/A;   ESOPHAGOGASTRODUODENOSCOPY (EGD) WITH PROPOFOL  N/A 10/13/2021   Procedure:  ESOPHAGOGASTRODUODENOSCOPY (EGD) WITH PROPOFOL ;  Surgeon: Janalyn Keene NOVAK, MD;  Location: [facility] ENDOSCOPY;  Service: Endoscopy;  Laterality: N/A;   HERNIA REPAIR     JOINT REPLACEMENT     Social History   Socioeconomic History   Marital status: Married    Spouse name: Not on file   Number of children: Not on file   Years of education: Not on file   Highest education level: Associate degree: academic program  Occupational History   Not on file  Tobacco Use    Smoking status: Never   Smokeless tobacco: Never  Vaping Use   Vaping status: Never Used  Substance and Sexual Activity   Alcohol use: Yes    Alcohol/week: 2.0 standard drinks of alcohol    Types: 2 Cans of beer per week   Drug use: No   Sexual activity: Yes    Birth control/protection: Post-menopausal  Other Topics Concern   Not on file  Social History Narrative   Not on file   Social Drivers of Health   Financial Resource Strain: Low Risk  (10/21/2024)   Overall Financial Resource Strain (CARDIA)    Difficulty of Paying Living Expenses: Not hard at all  Food Insecurity: No Food Insecurity (10/21/2024)   Hunger Vital Sign    Worried About Running Out of Food in the Last Year: Never true    Ran Out of Food in the Last Year: Never true  Transportation Needs: No Transportation Needs (10/21/2024)   PRAPARE - Administrator, Civil Service (Medical): No    Lack of Transportation (Non-Medical): No  Physical Activity: Sufficiently Active (10/21/2024)   Exercise Vital Sign    Days of Exercise per Week: 5 days    Minutes of Exercise per Session: 80 min  Stress: Patient Declined (10/21/2024)   [employer] of Occupational Health - Occupational Stress Questionnaire    Feeling of Stress: Patient declined  Social Connections: Socially Integrated (10/21/2024)   Social Connection and Isolation Panel    Frequency of Communication with Friends and Family: Once a week    Frequency of Social Gatherings with Friends and Family: More than three times a week    Attends Religious Services: More than 4 times per year    Active Member of [employer] or Organizations: Yes    Attends Banker Meetings: More than 4 times per year    Marital Status: Married  Catering Manager Violence: Not on file   Family Status  Relation Name Status   Mother Victoria Deceased   Father John Deceased   Bruna Brigham  (Not Specified)   Brother Norleen (Not Specified)   Daughter Shasta Alive   Pat Uncle  Butters Alive   Daughter [place] Alive   Sister [place] Alive   Daughter [place] Alive   Daughter [place] Alive   Brother Daniel Alive   Neg Hx  (Not Specified)  No partnership data on file   Family History  Problem Relation Age of Onset   Cancer Mother        melanoma   Early death Mother    Hearing loss Mother    Cancer Father        lung cancer   Heart disease Father    Benign prostatic hyperplasia Father    Arthritis Father    Hyperlipidemia Father    Hypertension Father    Prostate cancer Paternal Uncle    Benign prostatic hyperplasia Brother    Urolithiasis Brother    Early death  Brother    Hyperlipidemia Brother    Anxiety disorder Daughter    COPD Paternal Uncle    Depression Daughter    Anxiety disorder Daughter    Early death Sister    Heart disease Sister    Anxiety disorder Daughter    Anxiety disorder Daughter    Anxiety disorder Brother    Hyperlipidemia Brother    Hypertension Brother    Kidney cancer Neg Hx    Kidney disease Neg Hx    Allergies  Allergen Reactions   Grass Pollen(K-O-R-T-Swt Vern) Shortness Of Breath   Ibuprofen Swelling   Versed [Midazolam] Nausea And Vomiting   Penicillin V Potassium Rash and Other (See Comments)    Has patient had a PCN reaction causing immediate rash, facial/tongue/throat swelling, SOB or lightheadedness with hypotension: unknown Has patient had a PCN reaction causing severe rash involving mucus membranes or skin necrosis: unknown Has patient had a PCN reaction that required hospitalization No Has patient had a PCN reaction occurring within the last 10 years: No If all of the above answers are NO, then may proceed with Cephalosporin use.     Patient Care Team: Donzella Lauraine SAILOR, DO as PCP - General (Family Medicine)   Medications: Outpatient Medications Prior to Visit  Medication Sig   albuterol  (VENTOLIN  HFA) 108 (90 Base) MCG/ACT inhaler Inhale 2 puffs into the lungs every 6 (six) hours as needed for  wheezing or shortness of breath.   amLODipine  (NORVASC ) 5 MG tablet Take 1 tablet (5 mg total) by mouth daily.   budesonide -formoterol  (BREYNA ) 80-4.5 MCG/ACT inhaler Inhale 2 puffs into the lungs 2 (two) times daily.   Coenzyme Q10 (CO Q-10) 100 MG CAPS Take 200 mg by mouth daily.   famotidine  (PEPCID ) 20 MG tablet Take 1 tablet (20 mg total) by mouth 2 (two) times daily as needed for heartburn or indigestion.   Fluocinolone  Acetonide Body 0.01 % OIL Apply to damp skin after showering QD PRN.   loratadine (CLARITIN) 10 MG tablet Take 10 mg by mouth daily.   losartan -hydrochlorothiazide (HYZAAR) 100-25 MG tablet Take 1 tablet by mouth daily.   meloxicam  (MOBIC ) 7.5 MG tablet Take 1 tablet (7.5 mg total) by mouth daily as needed for pain. in place of meloxicam  15 mg (do not take both in same 24 hour period)   methocarbamol (ROBAXIN) 750 MG tablet Take 750 mg by mouth 4 (four) times daily.   metoprolol  succinate (TOPROL -XL) 25 MG 24 hr tablet Take 1 tablet (25 mg total) by mouth daily.   rosuvastatin  (CRESTOR ) 10 MG tablet Take 1 tablet (10 mg total) by mouth daily.   triamcinolone  (NASACORT) 55 MCG/ACT AERO nasal inhaler Place 2 sprays into the nose daily.   valACYclovir  (VALTREX ) 1000 MG tablet TAKE ONE TABLET BY MOUTH ONE TIME DAILY   [DISCONTINUED] guaiFENesin -codeine  100-10 MG/5ML syrup Take 10 mLs by mouth 3 (three) times daily as needed for cough.   [DISCONTINUED] meloxicam  (MOBIC ) 15 MG tablet Take 1 tablet by mouth daily.   [DISCONTINUED] methocarbamol (ROBAXIN) 500 MG tablet Take 500 mg by mouth in the morning and at bedtime.   No facility-administered medications prior to visit.    Review of Systems  Constitutional:  Negative for appetite change, chills, fatigue and fever.  HENT:  Negative for congestion, ear pain, hearing loss, nosebleeds and trouble swallowing.   Eyes:  Negative for pain and visual disturbance.  Respiratory:  Negative for cough, chest tightness and shortness of  breath.   Cardiovascular:  Negative for chest pain, palpitations and leg swelling.  Gastrointestinal:  Positive for abdominal pain (epigastric). Negative for blood in stool, constipation, diarrhea, nausea and vomiting.  Endocrine: Negative for polydipsia, polyphagia and polyuria.  Genitourinary:  Negative for dysuria and flank pain.  Musculoskeletal:  Negative for arthralgias, back pain, joint swelling, myalgias and neck stiffness.  Skin:  Negative for color change, rash and wound.  Neurological:  Negative for dizziness, tremors, seizures, speech difficulty, weakness, light-headedness and headaches.  Psychiatric/Behavioral:  Negative for behavioral problems, confusion, decreased concentration, dysphoric mood and sleep disturbance. The patient is not nervous/anxious.   All other systems reviewed and are negative.     Objective    BP 135/79 (BP Location: Left Arm, Patient Position: Sitting, Cuff Size: Normal)   Pulse (!) 55   Temp 97.9 F (36.6 C) (Oral)   Ht 5' 9 (1.753 m)   Wt 176 lb (79.8 kg)   SpO2 98%   BMI 25.99 kg/m    Physical Exam Vitals and nursing note reviewed.  Constitutional:      General: He is awake.     Appearance: Normal appearance.  HENT:     Head: Normocephalic and atraumatic.     Right Ear: Tympanic membrane, ear canal and external ear normal.     Left Ear: Tympanic membrane, ear canal and external ear normal.     Nose: Nose normal.     Mouth/Throat:     Mouth: Mucous membranes are moist.     Pharynx: Oropharynx is clear. No oropharyngeal exudate or posterior oropharyngeal erythema.  Eyes:     General: No scleral icterus.    Extraocular Movements: Extraocular movements intact.     Conjunctiva/sclera: Conjunctivae normal.     Pupils: Pupils are equal, round, and reactive to light.  Neck:     Thyroid: No thyromegaly or thyroid tenderness.  Cardiovascular:     Rate and Rhythm: Normal rate and regular rhythm.     Pulses: Normal pulses.     Heart  sounds: Normal heart sounds.  Pulmonary:     Effort: Pulmonary effort is normal. No tachypnea, bradypnea or respiratory distress.     Breath sounds: Normal breath sounds. No stridor. No wheezing, rhonchi or rales.  Abdominal:     General: Bowel sounds are normal. There is no distension.     Palpations: Abdomen is soft. There is no mass.     Tenderness: There is no abdominal tenderness. There is no guarding.     Hernia: No hernia is present.  Musculoskeletal:     Cervical back: Normal range of motion and neck supple.     Right lower leg: No edema.     Left lower leg: No edema.  Lymphadenopathy:     Cervical: No cervical adenopathy.  Skin:    General: Skin is warm and dry.  Neurological:     Mental Status: He is alert and oriented to person, place, and time. Mental status is at baseline.  Psychiatric:        Mood and Affect: Mood normal.        Behavior: Behavior normal.      Last depression screening scores    10/22/2024    9:49 AM 04/21/2024    8:54 AM 04/17/2023    8:45 AM  PHQ 2/9 Scores  PHQ - 2 Score 1 0 0  PHQ- 9 Score 3  0  0      Data saved with a previous flowsheet row definition   Last  fall risk screening    10/22/2024    9:49 AM  Fall Risk   Falls in the past year? 0  Number falls in past yr: 0  Injury with Fall? 0  Risk for fall due to : No Fall Risks   Last Audit-C alcohol use screening    10/21/2024   12:41 PM  Alcohol Use Disorder Test (AUDIT)  1. How often do you have a drink containing alcohol? 2  2. How many drinks containing alcohol do you have on a typical day when you are drinking? 0  3. How often do you have six or more drinks on one occasion? 0  AUDIT-C Score 2      Patient-reported   A score of 3 or more in women, and 4 or more in men indicates increased risk for alcohol abuse, EXCEPT if all of the points are from question 1   Results for orders placed or performed in visit on 10/22/24  CBC with Differential/Platelet  Result Value Ref  Range   WBC 6.8 3.4 - 10.8 x10E3/uL   RBC 4.78 4.14 - 5.80 x10E6/uL   Hemoglobin 16.0 13.0 - 17.7 g/dL   Hematocrit 53.7 62.4 - 51.0 %   MCV 97 79 - 97 fL   MCH 33.5 (H) 26.6 - 33.0 pg   MCHC 34.6 31.5 - 35.7 g/dL   RDW 86.0 88.3 - 84.5 %   Platelets 271 150 - 450 x10E3/uL   Neutrophils 54 Not Estab. %   Lymphs 34 Not Estab. %   Monocytes 9 Not Estab. %   Eos 2 Not Estab. %   Basos 1 Not Estab. %   Neutrophils Absolute 3.7 1.4 - 7.0 x10E3/uL   Lymphocytes Absolute 2.3 0.7 - 3.1 x10E3/uL   Monocytes Absolute 0.6 0.1 - 0.9 x10E3/uL   EOS (ABSOLUTE) 0.2 0.0 - 0.4 x10E3/uL   Basophils Absolute 0.1 0.0 - 0.2 x10E3/uL   Immature Granulocytes 0 Not Estab. %   Immature Grans (Abs) 0.0 0.0 - 0.1 x10E3/uL  Microalbumin / creatinine urine ratio  Result Value Ref Range   Creatinine, Urine 91.7 Not Estab. mg/dL   Microalbumin, Urine 7.2 Not Estab. ug/mL   Microalb/Creat Ratio 8 0 - 29 mg/g creat  Comprehensive metabolic panel with GFR  Result Value Ref Range   Glucose 96 70 - 99 mg/dL   BUN 17 8 - 27 mg/dL   Creatinine, Ser 8.97 0.76 - 1.27 mg/dL   eGFR 82 >40 fO/fpw/8.26   BUN/Creatinine Ratio 17 10 - 24   Sodium 142 134 - 144 mmol/L   Potassium 4.3 3.5 - 5.2 mmol/L   Chloride 101 96 - 106 mmol/L   CO2 26 20 - 29 mmol/L   Calcium  9.8 8.6 - 10.2 mg/dL   Total Protein 7.1 6.0 - 8.5 g/dL   Albumin 4.6 3.9 - 4.9 g/dL   Globulin, Total 2.5 1.5 - 4.5 g/dL   Bilirubin Total 0.6 0.0 - 1.2 mg/dL   Alkaline Phosphatase 58 47 - 123 IU/L   AST 31 0 - 40 IU/L   ALT 39 0 - 44 IU/L  Hemoglobin A1c  Result Value Ref Range   Hgb A1c MFr Bld 5.4 4.8 - 5.6 %   Est. average glucose Bld gHb Est-mCnc 108 mg/dL  Lipid panel  Result Value Ref Range   Cholesterol, Total 193 100 - 199 mg/dL   Triglycerides 879 0 - 149 mg/dL   HDL 46 >60 mg/dL   VLDL Cholesterol Cal 22  5 - 40 mg/dL   LDL Chol Calc (NIH) 874 (H) 0 - 99 mg/dL   Chol/HDL Ratio 4.2 0.0 - 5.0 ratio  Vitamin B12  Result Value Ref Range    Vitamin B-12 600 232 - 1,245 pg/mL    Assessment & Plan    Routine Health Maintenance and Physical Exam  Exercise Activities and Dietary recommendations  Goals   None     Immunization History  Administered Date(s) Administered    sv, Bivalent, Protein Subunit Rsvpref,pf (Abrysvo) 10/02/2022   Influenza Split 10/17/2016   Influenza,inj,Quad PF,6+ Mos 09/23/2017, 09/24/2019, 10/02/2022   Influenza,trivalent, recombinat, inj, PF 09/04/2023, 10/01/2024   Influenza-Unspecified 09/23/2017, 08/13/2021   PFIZER Comirnaty(Gray Top)Covid-19 Tri-Sucrose Vaccine 03/23/2021   PFIZER(Purple Top)SARS-COV-2 Vaccination 03/06/2020, 03/27/2020, 10/07/2020   PNEUMOCOCCAL CONJUGATE-20 10/01/2024   Pfizer Covid-19 Vaccine Bivalent Booster 35yrs & up 11/04/2021   Pfizer(Comirnaty)Fall Seasonal Vaccine 12 years and older 02/22/2023, 09/05/2023, 10/01/2024   Pneumococcal Conjugate-13 10/17/2016   Pneumococcal Polysaccharide-23 09/23/2017, 04/29/2019   Tdap 12/22/2017, 04/29/2019   Zoster Recombinant(Shingrix) 11/23/2021    Health Maintenance  Topic Date Due   COVID-19 Vaccine (9 - Pfizer risk 2025-26 season) 04/01/2025   DTaP/Tdap/Td (3 - Td or Tdap) 04/28/2029   Colonoscopy  10/10/2029   Pneumococcal Vaccine: 50+ Years  Completed   Influenza Vaccine  Completed   Hepatitis C Screening  Completed   HIV Screening  Completed   Hepatitis B Vaccines 19-59 Average Risk  Aged Out   HPV VACCINES  Aged Out   Meningococcal B Vaccine  Aged Out   Zoster Vaccines- Shingrix  Discontinued    Discussed health benefits of physical activity, and encouraged him to engage in regular exercise appropriate for his age and condition.   Annual physical exam  Epigastric pain Assessment & Plan: Pt reports evaluated by cardiology and cleared. Chronic GERD. Patient trying to avoid famotidine  use.  Orders: -     CBC with Differential/Platelet -     Pantoprazole  Sodium; Take 1 tablet (40 mg total) by mouth daily  for 28 days.  Dispense: 28 tablet; Refill: 0  Borderline diabetes -     Hemoglobin A1c  Mixed hyperlipidemia -     Lipid panel  Primary hypertension -     CBC with Differential/Platelet -     Comprehensive metabolic panel with GFR  Chronic kidney disease, stage 2, mildly decreased GFR -     Microalbumin / creatinine urine ratio  Gastroesophageal reflux disease with esophagitis without hemorrhage -     Vitamin B12  High risk medication use -     Vitamin B12  Perennial allergic rhinitis with seasonal variation -     CBC with Differential/Platelet  Chronic right-sided low back pain with right-sided sciatica     Annual physical exam Routine wellness visit. Physical exam overall unremarkable except as noted above. Routine lab work ordered as noted.  Engages in regular physical activity. - Ordered routine labs. - Continue current exercise regimen.  Gastroesophageal reflux disease with esophagitis, without hemorrhage Chronic GERD with epigastric pain, exacerbated by stress and possibly singing. Recent exacerbation after discontinuing famotidine . Discussed potential for Barrett's esophagus and eosinophilic esophagitis. Prefers to continue famotidine  with a short-course of pantoprazole . - Prescribed pantoprazole  for four weeks. - Resume famotidine  after pantoprazole  trial. - Consider taking famotidine  once daily consistently. - Advised reducing coffee intake.  Seasonal allergies Chronic allergic rhinitis with multiple allergies. Previous treatments ineffective. Hesitant about allergy shots.  Previous treatments with claritin (currently), zyrtec, xyzal, allegra and  montelukast  without noticeable improvement.  Recommend reduce exposure to triggers consideration of allergy shots in the future.  Chronic right-sided low back pain with right-sided sciatica Chronic sciatica with pain radiating down the right calf. Managed with methocarbamol, increased to 750 mg. - Continue methocarbamol  750 mg as needed.  Right hip flexor strain, post-hip replacement Persistent right hip flexor strain post-hip replacement. Previous use of Strepzone provided some relief. Engages in stretching exercises. - Consider physical therapy for additional support.  Chronic tight hamstrings Chronic tight hamstrings, longstanding issue. Engages in stretching exercises. - Continue stretching exercises for hamstrings.    Return in about 6 months (around 04/21/2025) for Chronic f/u, GERD.     I discussed the assessment and treatment plan with the patient  The patient was provided an opportunity to ask questions and all were answered. The patient agreed with the plan and demonstrated an understanding of the instructions.   The patient was advised to call back or seek an in-person evaluation if the symptoms worsen or if the condition fails to improve as anticipated.    LAURAINE LOISE BUOY, DO  [facility] [phone] (phone) [phone] (fax)  [facility]

## 2024-10-23 LAB — COMPREHENSIVE METABOLIC PANEL WITH GFR
ALT: 39 IU/L (ref 0–44)
AST: 31 IU/L (ref 0–40)
Albumin: 4.6 g/dL (ref 3.9–4.9)
Alkaline Phosphatase: 58 IU/L (ref 47–123)
BUN/Creatinine Ratio: 17 (ref 10–24)
BUN: 17 mg/dL (ref 8–27)
Bilirubin Total: 0.6 mg/dL (ref 0.0–1.2)
CO2: 26 mmol/L (ref 20–29)
Calcium: 9.8 mg/dL (ref 8.6–10.2)
Chloride: 101 mmol/L (ref 96–106)
Creatinine, Ser: 1.02 mg/dL (ref 0.76–1.27)
Globulin, Total: 2.5 g/dL (ref 1.5–4.5)
Glucose: 96 mg/dL (ref 70–99)
Potassium: 4.3 mmol/L (ref 3.5–5.2)
Sodium: 142 mmol/L (ref 134–144)
Total Protein: 7.1 g/dL (ref 6.0–8.5)
eGFR: 82 mL/min/1.73 (ref >59)

## 2024-10-23 LAB — CBC WITH DIFFERENTIAL/PLATELET
Basophils Absolute: 0.1 x10E3/uL (ref 0.0–0.2)
Basos: 1 %
EOS (ABSOLUTE): 0.2 x10E3/uL (ref 0.0–0.4)
Eos: 2 %
Hematocrit: 46.2 % (ref 37.5–51.0)
Hemoglobin: 16 g/dL (ref 13.0–17.7)
Immature Grans (Abs): 0 x10E3/uL (ref 0.0–0.1)
Immature Granulocytes: 0 %
Lymphocytes Absolute: 2.3 x10E3/uL (ref 0.7–3.1)
Lymphs: 34 %
MCH: 33.5 pg — ABNORMAL HIGH (ref 26.6–33.0)
MCHC: 34.6 g/dL (ref 31.5–35.7)
MCV: 97 fL (ref 79–97)
Monocytes Absolute: 0.6 x10E3/uL (ref 0.1–0.9)
Monocytes: 9 %
Neutrophils Absolute: 3.7 x10E3/uL (ref 1.4–7.0)
Neutrophils: 54 %
Platelets: 271 x10E3/uL (ref 150–450)
RBC: 4.78 x10E6/uL (ref 4.14–5.80)
RDW: 13.9 % (ref 11.6–15.4)
WBC: 6.8 x10E3/uL (ref 3.4–10.8)

## 2024-10-23 LAB — LIPID PANEL
Chol/HDL Ratio: 4.2 ratio (ref 0.0–5.0)
Cholesterol, Total: 193 mg/dL (ref 100–199)
HDL: 46 mg/dL (ref >39)
LDL Chol Calc (NIH): 125 mg/dL — ABNORMAL HIGH (ref 0–99)
Triglycerides: 120 mg/dL (ref 0–149)
VLDL Cholesterol Cal: 22 mg/dL (ref 5–40)

## 2024-10-23 LAB — MICROALBUMIN / CREATININE URINE RATIO
Creatinine, Urine: 91.7 mg/dL
Microalb/Creat Ratio: 8 mg/g{creat} (ref 0–29)
Microalbumin, Urine: 7.2 ug/mL

## 2024-10-23 LAB — HEMOGLOBIN A1C
Est. average glucose Bld gHb Est-mCnc: 108 mg/dL
Hgb A1c MFr Bld: 5.4 % (ref 4.8–5.6)

## 2024-10-23 LAB — VITAMIN B12: Vitamin B-12: 600 pg/mL (ref 232–1245)

## 2024-11-01 ENCOUNTER — Encounter: Payer: Self-pay | Admitting: Family Medicine

## 2024-11-01 ENCOUNTER — Ambulatory Visit: Payer: Self-pay | Admitting: Family Medicine

## 2024-11-03 ENCOUNTER — Other Ambulatory Visit: Payer: Self-pay

## 2024-11-03 ENCOUNTER — Encounter: Payer: Self-pay | Admitting: Family Medicine

## 2024-11-03 DIAGNOSIS — K120 Recurrent oral aphthae: Secondary | ICD-10-CM

## 2024-11-03 MED ORDER — VALACYCLOVIR HCL 1 G PO TABS: 1000.0000 mg | ORAL_TABLET | Freq: Every day | ORAL | 0 refills | Status: AC

## 2025-04-22 ENCOUNTER — Ambulatory Visit: Admitting: Family Medicine

## 2025-07-08 ENCOUNTER — Ambulatory Visit: Admitting: Dermatology
